# Patient Record
Sex: Female | Born: 1970 | Race: White | Smoking: Never smoker
Health system: Southern US, Community
[De-identification: ages and names within clinical notes are randomized; demographics above are authoritative.]

## PROBLEM LIST (undated history)

## (undated) DIAGNOSIS — I2699 Other pulmonary embolism without acute cor pulmonale: Secondary | ICD-10-CM

## (undated) DIAGNOSIS — C439 Malignant melanoma of skin, unspecified: Secondary | ICD-10-CM

## (undated) DIAGNOSIS — E538 Deficiency of other specified B group vitamins: Secondary | ICD-10-CM

## (undated) DIAGNOSIS — G43909 Migraine, unspecified, not intractable, without status migrainosus: Secondary | ICD-10-CM

## (undated) DIAGNOSIS — G4730 Sleep apnea, unspecified: Secondary | ICD-10-CM

## (undated) DIAGNOSIS — D500 Iron deficiency anemia secondary to blood loss (chronic): Secondary | ICD-10-CM

## (undated) DIAGNOSIS — C4490 Unspecified malignant neoplasm of skin, unspecified: Secondary | ICD-10-CM

## (undated) HISTORY — PX: TUBAL LIGATION: SHX77

## (undated) HISTORY — DX: Sleep apnea, unspecified: G47.30

## (undated) HISTORY — DX: Migraine, unspecified, not intractable, without status migrainosus: G43.909

## (undated) HISTORY — PX: OTHER SURGICAL HISTORY: SHX169

## (undated) HISTORY — DX: Other pulmonary embolism without acute cor pulmonale: I26.99

## (undated) HISTORY — DX: Unspecified malignant neoplasm of skin, unspecified: C44.90

## (undated) HISTORY — DX: Deficiency of other specified B group vitamins: E53.8

## (undated) HISTORY — DX: Iron deficiency anemia secondary to blood loss (chronic): D50.0

## (undated) HISTORY — DX: Malignant melanoma of skin, unspecified: C43.9

## (undated) MED FILL — Iron Sucrose Inj 20 MG/ML (Fe Equiv): INTRAVENOUS | Qty: 15 | Status: AC

---

## 2016-04-09 DIAGNOSIS — C439 Malignant melanoma of skin, unspecified: Secondary | ICD-10-CM | POA: Insufficient documentation

## 2018-01-07 DIAGNOSIS — I2699 Other pulmonary embolism without acute cor pulmonale: Secondary | ICD-10-CM

## 2018-01-07 HISTORY — DX: Other pulmonary embolism without acute cor pulmonale: I26.99

## 2018-01-13 ENCOUNTER — Encounter: Payer: Self-pay | Admitting: Gastroenterology

## 2018-04-28 DIAGNOSIS — N926 Irregular menstruation, unspecified: Secondary | ICD-10-CM | POA: Diagnosis not present

## 2018-04-28 DIAGNOSIS — N920 Excessive and frequent menstruation with regular cycle: Secondary | ICD-10-CM | POA: Diagnosis not present

## 2018-04-28 DIAGNOSIS — D649 Anemia, unspecified: Secondary | ICD-10-CM | POA: Diagnosis not present

## 2018-04-28 DIAGNOSIS — R110 Nausea: Secondary | ICD-10-CM | POA: Diagnosis not present

## 2018-05-14 DIAGNOSIS — N920 Excessive and frequent menstruation with regular cycle: Secondary | ICD-10-CM | POA: Diagnosis not present

## 2018-05-16 DIAGNOSIS — N920 Excessive and frequent menstruation with regular cycle: Secondary | ICD-10-CM | POA: Diagnosis not present

## 2018-05-26 DIAGNOSIS — N840 Polyp of corpus uteri: Secondary | ICD-10-CM | POA: Diagnosis not present

## 2018-05-26 DIAGNOSIS — N920 Excessive and frequent menstruation with regular cycle: Secondary | ICD-10-CM | POA: Diagnosis not present

## 2018-06-09 DIAGNOSIS — L821 Other seborrheic keratosis: Secondary | ICD-10-CM | POA: Diagnosis not present

## 2018-06-09 DIAGNOSIS — Z08 Encounter for follow-up examination after completed treatment for malignant neoplasm: Secondary | ICD-10-CM | POA: Diagnosis not present

## 2018-06-09 DIAGNOSIS — D2271 Melanocytic nevi of right lower limb, including hip: Secondary | ICD-10-CM | POA: Diagnosis not present

## 2018-06-09 DIAGNOSIS — Z85820 Personal history of malignant melanoma of skin: Secondary | ICD-10-CM | POA: Diagnosis not present

## 2018-06-09 DIAGNOSIS — Z1283 Encounter for screening for malignant neoplasm of skin: Secondary | ICD-10-CM | POA: Diagnosis not present

## 2018-06-09 DIAGNOSIS — D485 Neoplasm of uncertain behavior of skin: Secondary | ICD-10-CM | POA: Diagnosis not present

## 2018-06-18 ENCOUNTER — Telehealth: Payer: Self-pay | Admitting: Internal Medicine

## 2018-06-18 NOTE — Telephone Encounter (Signed)
A new hem appt has been scheduled for the pt to see Dr. Walden Field on 3/12 at 950am. Pt agreed to the appt date and time.

## 2018-06-19 ENCOUNTER — Telehealth: Payer: Self-pay | Admitting: Internal Medicine

## 2018-06-19 ENCOUNTER — Inpatient Hospital Stay: Payer: 59

## 2018-06-19 ENCOUNTER — Telehealth: Payer: Self-pay | Admitting: *Deleted

## 2018-06-19 ENCOUNTER — Other Ambulatory Visit: Payer: Self-pay

## 2018-06-19 ENCOUNTER — Inpatient Hospital Stay: Payer: 59 | Attending: Internal Medicine | Admitting: Internal Medicine

## 2018-06-19 ENCOUNTER — Encounter: Payer: Self-pay | Admitting: Internal Medicine

## 2018-06-19 VITALS — BP 142/93 | HR 79 | Temp 98.3°F | Resp 18 | Ht 64.0 in | Wt 188.2 lb

## 2018-06-19 DIAGNOSIS — D508 Other iron deficiency anemias: Secondary | ICD-10-CM | POA: Diagnosis not present

## 2018-06-19 DIAGNOSIS — I2699 Other pulmonary embolism without acute cor pulmonale: Secondary | ICD-10-CM | POA: Insufficient documentation

## 2018-06-19 DIAGNOSIS — Z86718 Personal history of other venous thrombosis and embolism: Secondary | ICD-10-CM

## 2018-06-19 DIAGNOSIS — Z7901 Long term (current) use of anticoagulants: Secondary | ICD-10-CM | POA: Insufficient documentation

## 2018-06-19 DIAGNOSIS — D500 Iron deficiency anemia secondary to blood loss (chronic): Secondary | ICD-10-CM

## 2018-06-19 DIAGNOSIS — Z79899 Other long term (current) drug therapy: Secondary | ICD-10-CM | POA: Diagnosis not present

## 2018-06-19 DIAGNOSIS — N920 Excessive and frequent menstruation with regular cycle: Secondary | ICD-10-CM | POA: Insufficient documentation

## 2018-06-19 DIAGNOSIS — Z85820 Personal history of malignant melanoma of skin: Secondary | ICD-10-CM | POA: Diagnosis not present

## 2018-06-19 LAB — COMPREHENSIVE METABOLIC PANEL
ALK PHOS: 82 U/L (ref 38–126)
ALT: 14 U/L (ref 0–44)
AST: 15 U/L (ref 15–41)
Albumin: 4 g/dL (ref 3.5–5.0)
Anion gap: 10 (ref 5–15)
BUN: 20 mg/dL (ref 6–20)
CALCIUM: 8.8 mg/dL — AB (ref 8.9–10.3)
CO2: 22 mmol/L (ref 22–32)
CREATININE: 0.83 mg/dL (ref 0.44–1.00)
Chloride: 107 mmol/L (ref 98–111)
GFR calc Af Amer: >60 mL/min (ref >60)
GFR calc non Af Amer: >60 mL/min (ref >60)
Glucose, Bld: 89 mg/dL (ref 70–99)
Potassium: 4.2 mmol/L (ref 3.5–5.1)
Sodium: 139 mmol/L (ref 135–145)
Total Bilirubin: 0.5 mg/dL (ref 0.3–1.2)
Total Protein: 7.4 g/dL (ref 6.5–8.1)

## 2018-06-19 LAB — CBC WITH DIFFERENTIAL/PLATELET
Abs Immature Granulocytes: 0.01 10*3/uL (ref 0.00–0.07)
BASOS PCT: 0 %
Basophils Absolute: 0 10*3/uL (ref 0.0–0.1)
Eosinophils Absolute: 0.1 10*3/uL (ref 0.0–0.5)
Eosinophils Relative: 1 %
HCT: 32.3 % — ABNORMAL LOW (ref 36.0–46.0)
Hemoglobin: 10.1 g/dL — ABNORMAL LOW (ref 12.0–15.0)
Immature Granulocytes: 0 %
Lymphocytes Relative: 26 %
Lymphs Abs: 1.5 10*3/uL (ref 0.7–4.0)
MCH: 26.6 pg (ref 26.0–34.0)
MCHC: 31.3 g/dL (ref 30.0–36.0)
MCV: 85 fL (ref 80.0–100.0)
Monocytes Absolute: 0.4 10*3/uL (ref 0.1–1.0)
Monocytes Relative: 7 %
Neutro Abs: 3.6 10*3/uL (ref 1.7–7.7)
Neutrophils Relative %: 66 %
Platelets: 320 10*3/uL (ref 150–400)
RBC: 3.8 MIL/uL — AB (ref 3.87–5.11)
RDW: 17.2 % — ABNORMAL HIGH (ref 11.5–15.5)
WBC: 5.6 10*3/uL (ref 4.0–10.5)
nRBC: 0 % (ref 0.0–0.2)

## 2018-06-19 LAB — FOLATE: Folate: 11.5 ng/mL (ref >5.9)

## 2018-06-19 LAB — LACTATE DEHYDROGENASE: LDH: 146 U/L (ref 98–192)

## 2018-06-19 LAB — VITAMIN B12: Vitamin B-12: 120 pg/mL — ABNORMAL LOW (ref 180–914)

## 2018-06-19 LAB — FERRITIN: Ferritin: <4 ng/mL — ABNORMAL LOW (ref 11–307)

## 2018-06-19 MED ORDER — APIXABAN 5 MG PO TABS: 5.0000 mg | ORAL_TABLET | Freq: Two times a day (BID) | ORAL | 2 refills | Status: DC

## 2018-06-19 NOTE — Progress Notes (Signed)
Referring Physician:  Dr. Dossie Arbour [facility]  Diagnosis Other iron deficiency anemia - Plan: CBC with Differential/Platelet, Comprehensive metabolic panel, Ferritin, Lactate dehydrogenase, Protein electrophoresis, serum, Hemoglobinopathy evaluation, Vitamin B12, Folate, Methylmalonic acid, serum, Haptoglobin, CT ANGIO CHEST PE W OR WO CONTRAST, VAS [place] LOWER EXTREMITY VENOUS (DVT)  Personal history of venous thrombosis and embolism - Plan: CBC with Differential/Platelet, Comprehensive metabolic panel, Ferritin, Lactate dehydrogenase, Protein electrophoresis, serum, Hemoglobinopathy evaluation, Vitamin B12, Folate, Methylmalonic acid, serum, Haptoglobin, CT ANGIO CHEST PE W OR WO CONTRAST, VAS [place] LOWER EXTREMITY VENOUS (DVT)  Staging Cancer Staging No matching staging information was found for the patient.  Assessment and Plan:  1.  Pulmonary emboli.  This was reportedly diagnosed in October 2019.  She has been maintained on Eliquis since that time.  She reports she had an extensive work-up while in [place] that was reportedly negative.  Will obtain records for review.  The patient will be set up for CT Angio of the chest as well as bilateral lower extremity Dopplers for interval evaluation in 07/2018.  I discussed with her usual recommendations are for 6 months of anticoagulation which she will approach in April 2019.  She will follow-up to go over the results.  Rx for Eliquis sent to pharmacy # [id number] with refills.    2.  Iron deficiency anemia.  Labs done 06/19/2018 showed a white count 5.6 hemoglobin 10 platelets 320,000.  She has a normal differential.  Chemistries within normal limits with a potassium of 4.2 creatinine 0.83 normal liver function tests.  Ferritin is decreased at 4 which is consistent with iron deficiency anemia.  Patient has an intolerance for oral iron.  Suspect etiology is due to menorrhagia but she will be referred to GI for evaluation.   Patient is recommended for treatment with Feraheme 510 mg IV day 1 and day 8.  Side effects of IV iron discussed and include an allergic reaction.  The patient will return to clinic in late April for repeat labs after IV iron.  3.  Menorrhagia.  This is likely the etiology of iron deficiency anemia.  She should continue to follow-up with GYN as recommended.  4.  Melanoma.  She reports she had a history of a melanoma on her leg.  She is followed by dermatology.  Will obtain records from [place] for review.  5.  Health maintenance.  Mammogram screenings as recommended.  Will refer to GI due to IDA.    40 minutes spent with more than 50% spent in review of records, counseling and coordination of care.    HPI: 48 year old female referred for consultation due to reported history of PE and iron deficiency.  She reports she was diagnosed with a pulmonary embolus in October 2019 when she lived in [place].  Work-up was reportedly done at that time but no etiology as to the reason for the development of the clot was found.  She has a history of iron deficiency anemia and has been treated with IV iron in November 2019.  She describes her cycles as heavy.  She was on Eliquis 5 mg twice daily.  She has a reported history of melanoma of her leg and is followed by dermatology and had a recent biopsy done.  At the time of the PE she denied any travel.  She denies any family history of thrombosis.  Patient is seen today for consultation due to  pulmonary emboli and iron deficiency anemia.  Problem List Melanoma Pulmonary emboli Iron deficiency anemia  Past Medical History Melanoma Pulmonary emboli Iron deficiency anemia  Past Surgical History Melanoma surgery GYN surgeries  Family History No family history of thrombosis  Social History  Denies smoking, alcohol use  Medications  Current Outpatient Medications:  .  apixaban (ELIQUIS) 5 MG TABS tablet, Take 1 tablet (5 mg total) by mouth 2 (two) times  daily., Disp: 60 tablet, Rfl: 2 .  Ferrous Sulfate (SLOW FE PO), Take by mouth., Disp: , Rfl:  .  pantoprazole (PROTONIX) 20 MG tablet, Take 20 mg by mouth daily., Disp: , Rfl:   Allergies Patient has no allergy information on record.  Review of Systems Review of Systems - Oncology ROS negative  Soreness of leg due to recent dermatology procedure   Physical Exam  Vitals Wt Readings from Last 3 Encounters:  06/19/18 188 lb 3.2 oz (85.4 kg)   Temp Readings from Last 3 Encounters:  06/19/18 98.3 F (36.8 C) (Oral)   BP Readings from Last 3 Encounters:  06/19/18 (!) 142/93   Pulse Readings from Last 3 Encounters:  06/19/18 79   Constitutional: Well-developed, well-nourished, and in no distress.   HENT: Head: Normocephalic and atraumatic.  Mouth/Throat: No oropharyngeal exudate. Mucosa moist. Eyes: Pupils are equal, round, and reactive to light. Conjunctivae are normal. No scleral icterus.  Neck: Normal range of motion. Neck supple. No JVD present.  Cardiovascular: Normal rate, regular rhythm and normal heart sounds.  Exam reveals no gallop and no friction rub.   No murmur heard. Pulmonary/Chest: Effort normal and breath sounds normal. No respiratory distress. No wheezes.No rales.  Abdominal: Soft. Bowel sounds are normal. No distension. There is no tenderness. There is no guarding.  Musculoskeletal: No edema or tenderness.  Lymphadenopathy: No cervical,axillary or supraclavicular adenopathy.  Neurological: Alert and oriented to person, place, and time. No cranial nerve deficit.  Skin: Skin is warm and dry. No rash noted. No erythema. No pallor.  Soreness of leg due to recent dermatology procedure.   Psychiatric: Affect and judgment normal.   Labs Appointment on 06/19/2018  Component Date Value Ref Range Status  . WBC 06/19/2018 5.6  4.0 - 10.5 K/uL Final  . RBC 06/19/2018 3.80* 3.87 - 5.11 MIL/uL Final  . Hemoglobin 06/19/2018 10.1* 12.0 - 15.0 g/dL Final  . HCT  06/19/2018 32.3* 36.0 - 46.0 % Final  . MCV 06/19/2018 85.0  80.0 - 100.0 fL Final  . MCH 06/19/2018 26.6  26.0 - 34.0 pg Final  . MCHC 06/19/2018 31.3  30.0 - 36.0 g/dL Final  . RDW 06/19/2018 17.2* 11.5 - 15.5 % Final  . Platelets 06/19/2018 320  150 - 400 K/uL Final  . nRBC 06/19/2018 0.0  0.0 - 0.2 % Final  . Neutrophils Relative % 06/19/2018 66  % Final  . Neutro Abs 06/19/2018 3.6  1.7 - 7.7 K/uL Final  . Lymphocytes Relative 06/19/2018 26  % Final  . Lymphs Abs 06/19/2018 1.5  0.7 - 4.0 K/uL Final  . Monocytes Relative 06/19/2018 7  % Final  . Monocytes Absolute 06/19/2018 0.4  0.1 - 1.0 K/uL Final  . Eosinophils Relative 06/19/2018 1  % Final  . Eosinophils Absolute 06/19/2018 0.1  0.0 - 0.5 K/uL Final  . Basophils Relative 06/19/2018 0  % Final  . Basophils Absolute 06/19/2018 0.0  0.0 - 0.1 K/uL Final  . Immature Granulocytes 06/19/2018 0  % Final  . Abs Immature Granulocytes  06/19/2018 0.01  0.00 - 0.07 K/uL Final   Performed at [facility], [place] [address] [place], [place] [address]  . Sodium 06/19/2018 139  135 - 145 mmol/L Final  . Potassium 06/19/2018 4.2  3.5 - 5.1 mmol/L Final  . Chloride 06/19/2018 107  98 - 111 mmol/L Final  . CO2 06/19/2018 22  22 - 32 mmol/L Final  . Glucose, Bld 06/19/2018 89  70 - 99 mg/dL Final  . BUN 06/19/2018 20  6 - 20 mg/dL Final  . Creatinine, Ser 06/19/2018 0.83  0.44 - 1.00 mg/dL Final  . Calcium 06/19/2018 8.8* 8.9 - 10.3 mg/dL Final  . Total Protein 06/19/2018 7.4  6.5 - 8.1 g/dL Final  . Albumin 06/19/2018 4.0  3.5 - 5.0 g/dL Final  . AST 06/19/2018 15  15 - 41 U/L Final  . ALT 06/19/2018 14  0 - 44 U/L Final  . Alkaline Phosphatase 06/19/2018 82  38 - 126 U/L Final  . Total Bilirubin 06/19/2018 0.5  0.3 - 1.2 mg/dL Final  . GFR calc non Af Amer 06/19/2018 >60  >60 mL/min Final  . GFR calc Af Amer 06/19/2018 >60  >60 mL/min Final  . Anion gap 06/19/2018 10  5 - 15 Final   Performed at [facility], [place] [address] [place], [place] [address]  . Ferritin 06/19/2018 <4* 11 - 307 ng/mL Final   Performed at [facility], [place] [address] [place], [place] [address]  . LDH 06/19/2018 146  98 - 192 U/L Final   Performed at [facility], [place] [address] [place], [place] [address]     Pathology Orders Placed This Encounter  Procedures  . CT ANGIO CHEST PE W OR WO CONTRAST    Standing Status:   Future    Standing Expiration Date:   09/19/2019    Order Specific Question:   If indicated for the ordered procedure, I authorize the administration of contrast media per Radiology protocol    Answer:   Yes    Order Specific Question:   Is patient pregnant?    Answer:   No    Order Specific Question:   Preferred imaging location?    Answer:   [facility]    Order Specific Question:   Radiology Contrast Protocol - do NOT remove file path    Answer:   [email or web address]  . CBC with Differential/Platelet    Standing Status:   Future    Number of Occurrences:   1    Standing Expiration Date:   06/19/2019  . Comprehensive metabolic panel    Standing Status:   Future    Number of Occurrences:   1    Standing Expiration Date:   06/19/2019  . Ferritin    Standing Status:   Future    Number of Occurrences:   1    Standing Expiration Date:   06/19/2019  . Lactate dehydrogenase    Standing Status:   Future    Number of Occurrences:   1    Standing Expiration Date:   06/19/2019  . Protein electrophoresis, serum    Standing Status:   Future    Number of Occurrences:   1    Standing Expiration Date:   06/19/2019  . Hemoglobinopathy evaluation    Standing Status:   Future    Number of Occurrences:   1    Standing Expiration Date:  06/19/2019  . Vitamin B12    Standing Status:   Future    Number of Occurrences:   1    Standing Expiration Date:   06/19/2019  . Folate    Standing Status:   Future     Number of Occurrences:   1    Standing Expiration Date:   06/19/2019  . Methylmalonic acid, serum    Standing Status:   Future    Number of Occurrences:   1    Standing Expiration Date:   06/19/2019  . Haptoglobin    Standing Status:   Future    Number of Occurrences:   1    Standing Expiration Date:   06/19/2019       Zoila Shutter MD

## 2018-06-19 NOTE — Telephone Encounter (Signed)
TCT patient regarding lab results from today's visit. Pt's iron level is low and will need IV iron x 2. No answer on pt's phone but was able to leave vm message regarding the above.  Advised pt to call back with any questions or concerns.  Scheduling message sent for IV fereheme appts.

## 2018-06-19 NOTE — Telephone Encounter (Signed)
-----   Message from Zoila Shutter, MD sent at 06/19/2018  1:42 PM EDT ----- Notify pt iron levels are low.  Set up for IV iron.  Orders entered.

## 2018-06-19 NOTE — Telephone Encounter (Signed)
Gave avs and calendar, called and scheduled ct and doppler

## 2018-06-20 ENCOUNTER — Other Ambulatory Visit: Payer: Self-pay | Admitting: Internal Medicine

## 2018-06-20 ENCOUNTER — Telehealth: Payer: Self-pay | Admitting: Internal Medicine

## 2018-06-20 ENCOUNTER — Telehealth: Payer: Self-pay | Admitting: *Deleted

## 2018-06-20 LAB — PROTEIN ELECTROPHORESIS, SERUM
A/G Ratio: 1.2 (ref 0.7–1.7)
Albumin ELP: 3.7 g/dL (ref 2.9–4.4)
Alpha-1-Globulin: 0.2 g/dL (ref 0.0–0.4)
Alpha-2-Globulin: 0.8 g/dL (ref 0.4–1.0)
BETA GLOBULIN: 1.2 g/dL (ref 0.7–1.3)
GAMMA GLOBULIN: 0.9 g/dL (ref 0.4–1.8)
Globulin, Total: 3.2 g/dL (ref 2.2–3.9)
Total Protein ELP: 6.9 g/dL (ref 6.0–8.5)

## 2018-06-20 LAB — HAPTOGLOBIN: Haptoglobin: 174 mg/dL (ref 42–296)

## 2018-06-20 MED FILL — PANTOPRAZOLE SOD DR 40 MG T: 40 | 30 days supply | Qty: 30 | Fill #0 | Status: TO

## 2018-06-20 MED FILL — ELIQUIS 5 MG TABLET: 5 | 30 days supply | Qty: 60 | Fill #0 | Status: TO

## 2018-06-20 MED FILL — LEVOTHYROXINE 25 MCG TABLET: 25 | 30 days supply | Qty: 30 | Fill #0 | Status: TO

## 2018-06-20 NOTE — Telephone Encounter (Signed)
-----   Message from Zoila Shutter, MD sent at 06/20/2018 10:07 AM EDT ----- Notify pt her B12 levels are also low and she will get B12 injection the day of iron treatment and continue monthly

## 2018-06-20 NOTE — Telephone Encounter (Signed)
Pt vmail full - unable to leave message - sent reminder letter in the mail with appt date and time per 3/12 sch message.

## 2018-06-20 NOTE — Telephone Encounter (Signed)
TCT to patient regarding lab rsults from this week. No answer but but able to leave message on identified phone #. Advised pt that her B12 levels were also low and that she would be getting a B12 injection the same day as her 1st iron infusion.  Advised that her iron infusions have not been scheduled yet but that I expect them to be in a few days. Advised pt to call back @ [phone] with any questions or concerns.

## 2018-06-21 LAB — METHYLMALONIC ACID, SERUM: Methylmalonic Acid, Quantitative: 195 nmol/L (ref 0–378)

## 2018-06-23 LAB — HEMOGLOBINOPATHY EVALUATION
Hgb A2 Quant: 2 % (ref 1.8–3.2)
Hgb A: 98 % (ref 96.4–98.8)
Hgb C: 0 %
Hgb F Quant: 0 % (ref 0.0–2.0)
Hgb S Quant: 0 %
Hgb Variant: 0 %

## 2018-06-23 MED FILL — TRANEXAMIC ACID 650 MG TAB: 650 | 5 days supply | Qty: 30 | Fill #0

## 2018-06-24 DIAGNOSIS — N920 Excessive and frequent menstruation with regular cycle: Secondary | ICD-10-CM | POA: Diagnosis not present

## 2018-06-25 ENCOUNTER — Inpatient Hospital Stay: Payer: 59

## 2018-06-25 ENCOUNTER — Other Ambulatory Visit: Payer: Self-pay | Admitting: Emergency Medicine

## 2018-06-25 ENCOUNTER — Other Ambulatory Visit: Payer: Self-pay

## 2018-06-25 ENCOUNTER — Encounter: Payer: Self-pay | Admitting: *Deleted

## 2018-06-25 VITALS — BP 130/77 | HR 74 | Temp 98.5°F | Resp 16

## 2018-06-25 DIAGNOSIS — D508 Other iron deficiency anemias: Secondary | ICD-10-CM | POA: Diagnosis not present

## 2018-06-25 DIAGNOSIS — D500 Iron deficiency anemia secondary to blood loss (chronic): Secondary | ICD-10-CM

## 2018-06-25 DIAGNOSIS — Z79899 Other long term (current) drug therapy: Secondary | ICD-10-CM | POA: Diagnosis not present

## 2018-06-25 DIAGNOSIS — Z7901 Long term (current) use of anticoagulants: Secondary | ICD-10-CM | POA: Diagnosis not present

## 2018-06-25 DIAGNOSIS — I2699 Other pulmonary embolism without acute cor pulmonale: Secondary | ICD-10-CM | POA: Diagnosis not present

## 2018-06-25 DIAGNOSIS — Z85820 Personal history of malignant melanoma of skin: Secondary | ICD-10-CM | POA: Diagnosis not present

## 2018-06-25 DIAGNOSIS — N920 Excessive and frequent menstruation with regular cycle: Secondary | ICD-10-CM | POA: Diagnosis not present

## 2018-06-25 LAB — CBC WITH DIFFERENTIAL (CANCER CENTER ONLY)
Abs Immature Granulocytes: 0.01 10*3/uL (ref 0.00–0.07)
Basophils Absolute: 0 10*3/uL (ref 0.0–0.1)
Basophils Relative: 1 %
Eosinophils Absolute: 0 10*3/uL (ref 0.0–0.5)
Eosinophils Relative: 1 %
HCT: 22.1 % — ABNORMAL LOW (ref 36.0–46.0)
Hemoglobin: 6.9 g/dL — CL (ref 12.0–15.0)
Immature Granulocytes: 0 %
Lymphocytes Relative: 25 %
Lymphs Abs: 1.2 10*3/uL (ref 0.7–4.0)
MCH: 26.5 pg (ref 26.0–34.0)
MCHC: 31.2 g/dL (ref 30.0–36.0)
MCV: 85 fL (ref 80.0–100.0)
MONO ABS: 0.4 10*3/uL (ref 0.1–1.0)
Monocytes Relative: 8 %
NEUTROS ABS: 3.1 10*3/uL (ref 1.7–7.7)
Neutrophils Relative %: 65 %
Platelet Count: 263 10*3/uL (ref 150–400)
RBC: 2.6 MIL/uL — ABNORMAL LOW (ref 3.87–5.11)
RDW: 16.8 % — ABNORMAL HIGH (ref 11.5–15.5)
WBC Count: 4.8 10*3/uL (ref 4.0–10.5)
nRBC: 0 % (ref 0.0–0.2)

## 2018-06-25 LAB — SAMPLE TO BLOOD BANK

## 2018-06-25 LAB — ABO/RH: ABO/RH(D): O POS

## 2018-06-25 LAB — PREPARE RBC (CROSSMATCH)

## 2018-06-25 MED ORDER — SODIUM CHLORIDE 0.9 % IV SOLN
510.0000 mg | Freq: Once | INTRAVENOUS | Status: AC
  Administered 2018-06-25: 510 mg via INTRAVENOUS
  Filled 2018-06-25: qty 17

## 2018-06-25 MED ORDER — DIPHENHYDRAMINE HCL 25 MG PO CAPS
ORAL_CAPSULE | ORAL | Status: AC
  Filled 2018-06-25: qty 1

## 2018-06-25 MED ORDER — ACETAMINOPHEN 325 MG PO TABS
ORAL_TABLET | ORAL | Status: AC
  Filled 2018-06-25: qty 2

## 2018-06-25 MED ORDER — CYANOCOBALAMIN 1000 MCG/ML IJ SOLN
INTRAMUSCULAR | Status: AC
  Filled 2018-06-25: qty 1

## 2018-06-25 MED ORDER — SODIUM CHLORIDE 0.9 % IV SOLN
Freq: Once | INTRAVENOUS | Status: AC
  Administered 2018-06-25: 11:00:00 via INTRAVENOUS
  Filled 2018-06-25: qty 250

## 2018-06-25 MED ORDER — CYANOCOBALAMIN 1000 MCG/ML IJ SOLN
1000.0000 ug | Freq: Once | INTRAMUSCULAR | Status: AC
  Administered 2018-06-25: 1000 ug via INTRAMUSCULAR

## 2018-06-25 MED ORDER — SODIUM CHLORIDE 0.9% IV SOLUTION
250.0000 mL | Freq: Once | INTRAVENOUS | Status: AC
  Administered 2018-06-25: 250 mL via INTRAVENOUS
  Filled 2018-06-25: qty 250

## 2018-06-25 NOTE — Progress Notes (Signed)
Pt given signed note from MD [name] for work/school attendance.  VO from MD [name] to have f/u lab appt on 3/27 before pt's next iron infusion to recheck Hgb in case she needs more blood.  Order place and scheduling message sent.

## 2018-06-25 NOTE — Patient Instructions (Addendum)
Blood Transfusion, Adult, Care After This sheet gives you information about how to care for yourself after your procedure. Your doctor may also give you more specific instructions. If you have problems or questions, contact your doctor. Follow these instructions at home:   Take over-the-counter and prescription medicines only as told by your doctor.  Go back to your normal activities as told by your doctor.  Follow instructions from your doctor about how to take care of the area where an IV tube was put into your vein (insertion site). Make sure you: ? Wash your hands with soap and water before you change your bandage (dressing). If there is no soap and water, use hand sanitizer. ? Change your bandage as told by your doctor.  Check your IV insertion site every day for signs of infection. Check for: ? More redness, swelling, or pain. ? More fluid or blood. ? Warmth. ? Pus or a bad smell. Contact a doctor if:  You have more redness, swelling, or pain around the IV insertion site.  You have more fluid or blood coming from the IV insertion site.  Your IV insertion site feels warm to the touch.  You have pus or a bad smell coming from the IV insertion site.  Your pee (urine) turns pink, red, or brown.  You feel weak after doing your normal activities. Get help right away if:  You have signs of a serious allergic or body defense (immune) system reaction, including: ? Itchiness. ? Hives. ? Trouble breathing. ? Anxiety. ? Pain in your chest or lower back. ? Fever, flushing, and chills. ? Fast pulse. ? Rash. ? Watery poop (diarrhea). ? Throwing up (vomiting). ? Dark pee. ? Serious headache. ? Dizziness. ? Stiff neck. ? Yellow color in your face or the white parts of your eyes (jaundice). Summary  After a blood transfusion, return to your normal activities as told by your doctor.  Every day, check for signs of infection where the IV tube was put into your vein.  Some  signs of infection are warm skin, more redness and pain, more fluid or blood, and pus or a bad smell where the needle went in.  Contact your doctor if you feel weak or have any unusual symptoms. This information is not intended to replace advice given to you by your health care provider. Make sure you discuss any questions you have with your health care provider. Document Released: 04/16/2014 Document Revised: 11/18/2015 Document Reviewed: 11/18/2015 Elsevier Interactive Patient Education  2019 Elsevier Inc.  

## 2018-06-26 ENCOUNTER — Telehealth: Payer: Self-pay | Admitting: *Deleted

## 2018-06-26 LAB — TYPE AND SCREEN
ABO/RH(D): O POS
Antibody Screen: NEGATIVE
Unit division: 0

## 2018-06-26 LAB — BPAM RBC
Blood Product Expiration Date: 202004132359
ISSUE DATE / TIME: 202003181412
Unit Type and Rh: 5100

## 2018-06-26 NOTE — Telephone Encounter (Signed)
TCT patient with re-scheduled dates for CT Angio and LE doppler studies. Reviewed dates and times with patient. She voiced understanding and will be able to make those appointments.

## 2018-06-27 ENCOUNTER — Other Ambulatory Visit: Payer: Self-pay

## 2018-06-27 ENCOUNTER — Telehealth: Payer: Self-pay | Admitting: *Deleted

## 2018-06-27 ENCOUNTER — Ambulatory Visit: Payer: 59

## 2018-06-27 ENCOUNTER — Ambulatory Visit (HOSPITAL_COMMUNITY)
Admission: RE | Admit: 2018-06-27 | Discharge: 2018-06-27 | Disposition: A | Discharge status: Home / Self Care | Payer: 59 | Source: Ambulatory Visit | Attending: Internal Medicine | Admitting: Internal Medicine

## 2018-06-27 DIAGNOSIS — Z86718 Personal history of other venous thrombosis and embolism: Secondary | ICD-10-CM

## 2018-06-27 DIAGNOSIS — D508 Other iron deficiency anemias: Secondary | ICD-10-CM | POA: Diagnosis not present

## 2018-06-27 NOTE — Telephone Encounter (Signed)
"  [place] call report.  Caroll Rancher s/p bilateral lower extremity venous study.  Study is completely negative.  She is leaving here to go to work.  Asked for office to contact her if any instructions."

## 2018-06-27 NOTE — Progress Notes (Signed)
Bilateral lower extremity venous duplex completed. Preliminary results in Chart review CV proc. [place] [name],RVS 06/27/2018 10:27 AM

## 2018-06-27 NOTE — Telephone Encounter (Signed)
Conveyed Provider instructions.

## 2018-07-03 ENCOUNTER — Telehealth: Payer: Self-pay | Admitting: *Deleted

## 2018-07-03 ENCOUNTER — Other Ambulatory Visit: Payer: Self-pay

## 2018-07-03 ENCOUNTER — Ambulatory Visit (HOSPITAL_COMMUNITY)
Admission: RE | Admit: 2018-07-03 | Discharge: 2018-07-03 | Disposition: A | Discharge status: Home / Self Care | Payer: 59 | Source: Ambulatory Visit | Attending: Internal Medicine | Admitting: Internal Medicine

## 2018-07-03 DIAGNOSIS — R0602 Shortness of breath: Secondary | ICD-10-CM | POA: Diagnosis not present

## 2018-07-03 DIAGNOSIS — Z86718 Personal history of other venous thrombosis and embolism: Secondary | ICD-10-CM | POA: Insufficient documentation

## 2018-07-03 DIAGNOSIS — D508 Other iron deficiency anemias: Secondary | ICD-10-CM | POA: Insufficient documentation

## 2018-07-03 MED ORDER — IOHEXOL 350 MG/ML SOLN
100.0000 mL | Freq: Once | INTRAVENOUS | Status: AC | PRN
  Administered 2018-07-03: 58 mL via INTRAVENOUS

## 2018-07-03 MED ORDER — SODIUM CHLORIDE (PF) 0.9 % IJ SOLN
INTRAMUSCULAR | Status: AC
  Filled 2018-07-03: qty 50

## 2018-07-03 NOTE — Telephone Encounter (Signed)
Attempted to call pt to inform her of lab appt prior to iron infusion on 07/04/18.  No answers.  Unable to leave message due to voice mail box is full.

## 2018-07-04 ENCOUNTER — Other Ambulatory Visit: Payer: Self-pay

## 2018-07-04 ENCOUNTER — Other Ambulatory Visit: Payer: 59

## 2018-07-04 ENCOUNTER — Inpatient Hospital Stay: Payer: 59

## 2018-07-04 VITALS — BP 131/83 | HR 69 | Temp 98.5°F | Resp 18

## 2018-07-04 DIAGNOSIS — I2699 Other pulmonary embolism without acute cor pulmonale: Secondary | ICD-10-CM | POA: Diagnosis not present

## 2018-07-04 DIAGNOSIS — Z79899 Other long term (current) drug therapy: Secondary | ICD-10-CM | POA: Diagnosis not present

## 2018-07-04 DIAGNOSIS — Z85820 Personal history of malignant melanoma of skin: Secondary | ICD-10-CM | POA: Diagnosis not present

## 2018-07-04 DIAGNOSIS — Z7901 Long term (current) use of anticoagulants: Secondary | ICD-10-CM | POA: Diagnosis not present

## 2018-07-04 DIAGNOSIS — D508 Other iron deficiency anemias: Secondary | ICD-10-CM | POA: Diagnosis not present

## 2018-07-04 DIAGNOSIS — N920 Excessive and frequent menstruation with regular cycle: Secondary | ICD-10-CM | POA: Diagnosis not present

## 2018-07-04 DIAGNOSIS — D500 Iron deficiency anemia secondary to blood loss (chronic): Secondary | ICD-10-CM

## 2018-07-04 MED ORDER — SODIUM CHLORIDE 0.9 % IV SOLN
Freq: Once | INTRAVENOUS | Status: AC
  Administered 2018-07-04: 09:00:00 via INTRAVENOUS
  Filled 2018-07-04: qty 250

## 2018-07-04 MED ORDER — SODIUM CHLORIDE 0.9 % IV SOLN
510.0000 mg | Freq: Once | INTRAVENOUS | Status: AC
  Administered 2018-07-04: 510 mg via INTRAVENOUS
  Filled 2018-07-04: qty 17

## 2018-07-04 NOTE — Telephone Encounter (Signed)
Opened in error

## 2018-07-04 NOTE — Patient Instructions (Addendum)
Coronavirus (COVID-19) Are you at risk?  Are you at risk for the Coronavirus (COVID-19)?  To be considered HIGH RISK for Coronavirus (COVID-19), you have to meet the following criteria:  . Traveled to [place], [place], [place], [place] or [place]; or in the [place] to [place], [place], [place], or [place]; and have fever, cough, and shortness of breath within the last 2 weeks of travel OR . Been in close contact with a person diagnosed with COVID-19 within the last 2 weeks and have fever, cough, and shortness of breath . IF YOU DO NOT MEET THESE CRITERIA, YOU ARE CONSIDERED LOW RISK FOR COVID-19.  What to do if you are HIGH RISK for COVID-19?  . If you are having a medical emergency, call 911. . Seek medical care right away. Before you go to a doctor's office, urgent care or emergency department, call ahead and tell them about your recent travel, contact with someone diagnosed with COVID-19, and your symptoms. You should receive instructions from your physician's office regarding next steps of care.  . When you arrive at healthcare provider, tell the healthcare staff immediately you have returned from visiting [place], [place], [place], [place] or [place]; or traveled in the [place] to [place], [place], [place], or [place]; in the last two weeks or you have been in close contact with a person diagnosed with COVID-19 in the last 2 weeks.   . Tell the health care staff about your symptoms: fever, cough and shortness of breath. . After you have been seen by a medical provider, you will be either: o Tested for (COVID-19) and discharged home on quarantine except to seek medical care if symptoms worsen, and asked to  - Stay home and avoid contact with others until you get your results (4-5 days)  - Avoid travel on public transportation if possible (such as bus, train, or airplane) or o Sent to the Emergency Department by EMS for evaluation, COVID-19 testing, and possible  admission depending on your condition and test results.  What to do if you are LOW RISK for COVID-19?  Reduce your risk of any infection by using the same precautions used for avoiding the common cold or flu:  . Wash your hands often with soap and warm water for at least 20 seconds.  If soap and water are not readily available, use an alcohol-based hand sanitizer with at least 60% alcohol.  . If coughing or sneezing, cover your mouth and nose by coughing or sneezing into the elbow areas of your shirt or coat, into a tissue or into your sleeve (not your hands). . Avoid shaking hands with others and consider head nods or verbal greetings only. . Avoid touching your eyes, nose, or mouth with unwashed hands.  . Avoid close contact with people who are sick. . Avoid places or events with large numbers of people in one location, like concerts or sporting events. . Carefully consider travel plans you have or are making. . If you are planning any travel outside or inside the [place], visit the CDC's Travelers' Health webpage for the latest health notices. . If you have some symptoms but not all symptoms, continue to monitor at home and seek medical attention if your symptoms worsen. . If you are having a medical emergency, call 911.  ADDITIONAL HEALTHCARE OPTIONS FOR PATIENTS  [facility] / e-Visit: [email or web address]         [facility]: [phone]  [facility]: [phone]                   [facility]: [phone]   Ferumoxytol injection What is this medicine? FERUMOXYTOL is an iron complex. Iron is used to make healthy red blood cells, which carry oxygen and nutrients throughout the body. This medicine is used to treat iron deficiency anemia. This medicine may be used for other purposes; ask your health care provider or pharmacist if you have questions. COMMON BRAND NAME(S): Feraheme What should I  tell my health care provider before I take this medicine? They need to know if you have any of these conditions: -anemia not caused by low iron levels -high levels of iron in the blood -magnetic resonance imaging (MRI) test scheduled -an unusual or allergic reaction to iron, other medicines, foods, dyes, or preservatives -pregnant or trying to get pregnant -breast-feeding How should I use this medicine? This medicine is for injection into a vein. It is given by a health care professional in a hospital or clinic setting. Talk to your pediatrician regarding the use of this medicine in children. Special care may be needed. Overdosage: If you think you have taken too much of this medicine contact a poison control center or emergency room at once. NOTE: This medicine is only for you. Do not share this medicine with others. What if I miss a dose? It is important not to miss your dose. Call your doctor or health care professional if you are unable to keep an appointment. What may interact with this medicine? This medicine may interact with the following medications: -other iron products This list may not describe all possible interactions. Give your health care provider a list of all the medicines, herbs, non-prescription drugs, or dietary supplements you use. Also tell them if you smoke, drink alcohol, or use illegal drugs. Some items may interact with your medicine. What should I watch for while using this medicine? Visit your doctor or healthcare professional regularly. Tell your doctor or healthcare professional if your symptoms do not start to get better or if they get worse. You may need blood work done while you are taking this medicine. You may need to follow a special diet. Talk to your doctor. Foods that contain iron include: whole grains/cereals, dried fruits, beans, or peas, leafy green vegetables, and organ meats (liver, kidney). What side effects may I notice from receiving this  medicine? Side effects that you should report to your doctor or health care professional as soon as possible: -allergic reactions like skin rash, itching or hives, swelling of the face, lips, or tongue -breathing problems -changes in blood pressure -feeling faint or lightheaded, falls -fever or chills -flushing, sweating, or hot feelings -swelling of the ankles or feet Side effects that usually do not require medical attention (report to your doctor or health care professional if they continue or are bothersome): -diarrhea -headache -nausea, vomiting -stomach pain This list may not describe all possible side effects. Call your doctor for medical advice about side effects. You may report side effects to FDA at 1-800-FDA-1088. Where should I keep my medicine? This drug is given in a hospital or clinic and will not be stored at home. NOTE: This sheet is a summary. It may not cover all possible information. If you have questions about this medicine, talk to your doctor, pharmacist, or health care provider.  2019 Elsevier/Gold Standard (2016-05-14 20:21:10)  

## 2018-07-28 ENCOUNTER — Encounter (HOSPITAL_COMMUNITY): Payer: 59

## 2018-07-28 ENCOUNTER — Ambulatory Visit (HOSPITAL_COMMUNITY): Payer: 59

## 2018-08-04 ENCOUNTER — Telehealth: Payer: Self-pay | Admitting: Internal Medicine

## 2018-08-04 NOTE — Telephone Encounter (Signed)
Called patient per 4/24 sch message - unable to reach patient . Left message for patient that appt was changed to a phone call.

## 2018-08-07 ENCOUNTER — Inpatient Hospital Stay: Payer: 59 | Attending: Internal Medicine | Admitting: Internal Medicine

## 2018-08-07 ENCOUNTER — Telehealth: Payer: Self-pay | Admitting: *Deleted

## 2018-08-07 ENCOUNTER — Other Ambulatory Visit: Payer: Self-pay | Admitting: Internal Medicine

## 2018-08-07 DIAGNOSIS — Z85820 Personal history of malignant melanoma of skin: Secondary | ICD-10-CM

## 2018-08-07 DIAGNOSIS — D509 Iron deficiency anemia, unspecified: Secondary | ICD-10-CM | POA: Diagnosis not present

## 2018-08-07 DIAGNOSIS — N920 Excessive and frequent menstruation with regular cycle: Secondary | ICD-10-CM | POA: Diagnosis not present

## 2018-08-07 DIAGNOSIS — D500 Iron deficiency anemia secondary to blood loss (chronic): Secondary | ICD-10-CM

## 2018-08-07 MED FILL — PANTOPRAZOLE SOD DR 40 MG T: 40 | 30 days supply | Qty: 30 | Fill #0

## 2018-08-07 MED FILL — LEVOTHYROXINE 25 MCG TABLET: 25 | 30 days supply | Qty: 30 | Fill #0

## 2018-08-07 MED FILL — ELIQUIS 5 MG TABLET: 5 | 30 days supply | Qty: 60 | Fill #0

## 2018-08-07 NOTE — Progress Notes (Signed)
Virtual Visit via Telephone Note  I connected with Rhonda Ford on 08/07/18 at  9:30 AM EDT by telephone and verified that I am speaking with the correct person using two identifiers.   I discussed the limitations, risks, security and privacy concerns of performing an evaluation and management service by telephone and the availability of in person appointments. I also discussed with the patient that there may be a patient responsible charge related to this service. The patient expressed understanding and agreed to proceed.  Interval History:  Historical data obtained from note dated 06/19/2018.  48 year old female referred for consultation due to reported history of PE and iron deficiency.  She reports she was diagnosed with a pulmonary embolus in October 2019 when she lived in [place].  Work-up was reportedly done at that time but no etiology as to the reason for the development of the clot was found.  She has a history of iron deficiency anemia and has been treated with IV iron in November 2019.  She describes her cycles as heavy.  She was on Eliquis 5 mg twice daily.  She has a reported history of melanoma of her leg and is followed by dermatology and had a recent biopsy done.  At the time of the PE she denied any travel.  She denies any family history of thrombosis.   Observations/Objective: Review of CT and doppler studies.     Assessment and Plan:1.  Pulmonary emboli.  This was reportedly diagnosed in October 2019.  She has been maintained on Eliquis since that time.  She reports she had an extensive work-up while in [place] that was reportedly negative.  We have still not received records from [place].  CT Angio of the chest done 06/27/2018 reviewed and showed   IMPRESSION: 1. No demonstrable pulmonary embolus. No thoracic aortic aneurysm or dissection.  2.  Lungs clear.  3. No demonstrable thoracic adenopathy. No evident neoplastic focus in the chest.  Bilateral lower extremity  Dopplers done 06/27/2018 reviewed and showed Summary: Right: There is no evidence of deep vein thrombosis in the lower extremity. No cystic structure found in the popliteal fossa. Left: There is no evidence of deep vein thrombosis in the lower extremity. No cystic structure found in the popliteal fossa.  Pt was informed in 06/2018 we see no evidence of thrombosis.  She was informed to discontinue Eliquis.  Pt reports today she was slowly lowering dose of Eliquis and did not stop the medication as previously directed.  I discussed with her today that severe anemia with bleeding is also the reason to discontinue Eliquis as recent imaging has shown no evidence of thrombosis.  Pt is advised to forward records once available for review. Pt will be seen for follow-up in 10/2018 with labs.     2.  Iron deficiency anemia.  Labs done 06/19/2018 showed a white count 5.6 hemoglobin 10 platelets 320,000.  She has a normal differential.  Chemistries within normal limits with a potassium of 4.2 creatinine 0.83 normal liver function tests.  Ferritin is decreased at 4 which is consistent with iron deficiency anemia.    Pt had labs done 06/25/2018 that showed WBC 4.8 HB 6.9 plts 263,000.  She was treated with Feraheme on 06/25/2018 and 07/04/2018.  She has not had repeat labs as previously recommended.  Pt will come in on 08/08/2018 for labs and will be notified of results.  She should follow-up with GYN and GI as previously recommended.  Suspect etiology is due to  menorrhagia.    3.  Menorrhagia.  This is likely the etiology of iron deficiency anemia.  She should continue to follow-up with GYN as recommended.  4.  Melanoma.  She reports she had a history of a melanoma on her leg.  She is followed by dermatology.  Have not received [place] records. Will ask for staff to contact facility for records.    5.  Health maintenance.  Mammogram screenings as recommended.  Will refer to GI due to IDA.    Follow Up Instructions: Pt  will have labs on 08/08/2018.  She will RTC for follow-up in 10/2018 with labs at that time.      I discussed the assessment and treatment plan with the patient. The patient was provided an opportunity to ask questions and all were answered. The patient agreed with the plan and demonstrated an understanding of the instructions.   The patient was advised to call back or seek an in-person evaluation if the symptoms worsen or if the condition fails to improve as anticipated.  I provided 15 minutes of non-face-to-face time during this encounter.   Zoila Shutter, MD

## 2018-08-07 NOTE — Telephone Encounter (Signed)
Spoke with Asim, pharmacy tech @ [facility], and instructed him  NOT to refill Eliquis  As per Dr. Walden Field instructions.  Asim voiced understanding and stated he would close out this prescription.

## 2018-08-08 ENCOUNTER — Other Ambulatory Visit: Payer: Self-pay

## 2018-08-08 ENCOUNTER — Telehealth: Payer: Self-pay | Admitting: *Deleted

## 2018-08-08 ENCOUNTER — Inpatient Hospital Stay: Payer: 59 | Attending: Internal Medicine

## 2018-08-08 DIAGNOSIS — D500 Iron deficiency anemia secondary to blood loss (chronic): Secondary | ICD-10-CM | POA: Insufficient documentation

## 2018-08-08 DIAGNOSIS — N920 Excessive and frequent menstruation with regular cycle: Secondary | ICD-10-CM | POA: Insufficient documentation

## 2018-08-08 LAB — CBC WITH DIFFERENTIAL (CANCER CENTER ONLY)
Abs Immature Granulocytes: 0.02 10*3/uL (ref 0.00–0.07)
Basophils Absolute: 0 10*3/uL (ref 0.0–0.1)
Basophils Relative: 0 %
Eosinophils Absolute: 0.1 10*3/uL (ref 0.0–0.5)
Eosinophils Relative: 2 %
HCT: 35.4 % — ABNORMAL LOW (ref 36.0–46.0)
Hemoglobin: 11.3 g/dL — ABNORMAL LOW (ref 12.0–15.0)
Immature Granulocytes: 0 %
Lymphocytes Relative: 23 %
Lymphs Abs: 1.3 10*3/uL (ref 0.7–4.0)
MCH: 29.4 pg (ref 26.0–34.0)
MCHC: 31.9 g/dL (ref 30.0–36.0)
MCV: 92.2 fL (ref 80.0–100.0)
Monocytes Absolute: 0.4 10*3/uL (ref 0.1–1.0)
Monocytes Relative: 8 %
Neutro Abs: 3.6 10*3/uL (ref 1.7–7.7)
Neutrophils Relative %: 67 %
Platelet Count: 295 10*3/uL (ref 150–400)
RBC: 3.84 MIL/uL — ABNORMAL LOW (ref 3.87–5.11)
RDW: 16.1 % — ABNORMAL HIGH (ref 11.5–15.5)
WBC Count: 5.4 10*3/uL (ref 4.0–10.5)
nRBC: 0 % (ref 0.0–0.2)

## 2018-08-08 LAB — CMP (CANCER CENTER ONLY)
ALT: 15 U/L (ref 0–44)
AST: 11 U/L — ABNORMAL LOW (ref 15–41)
Albumin: 3.6 g/dL (ref 3.5–5.0)
Alkaline Phosphatase: 75 U/L (ref 38–126)
Anion gap: 8 (ref 5–15)
BUN: 18 mg/dL (ref 6–20)
CO2: 24 mmol/L (ref 22–32)
Calcium: 8.5 mg/dL — ABNORMAL LOW (ref 8.9–10.3)
Chloride: 107 mmol/L (ref 98–111)
Creatinine: 0.72 mg/dL (ref 0.44–1.00)
GFR, Est AFR Am: >60 mL/min (ref >60)
GFR, Estimated: >60 mL/min (ref >60)
Glucose, Bld: 99 mg/dL (ref 70–99)
Potassium: 3.8 mmol/L (ref 3.5–5.1)
Sodium: 139 mmol/L (ref 135–145)
Total Bilirubin: 0.3 mg/dL (ref 0.3–1.2)
Total Protein: 6.8 g/dL (ref 6.5–8.1)

## 2018-08-08 LAB — LACTATE DEHYDROGENASE: LDH: 125 U/L (ref 98–192)

## 2018-08-08 LAB — FERRITIN: Ferritin: 11 ng/mL (ref 11–307)

## 2018-08-08 NOTE — Telephone Encounter (Signed)
-----   Message from Zoila Shutter, MD sent at 08/08/2018 12:27 PM EDT ----- Notity pt HB improved at 11 and iron levels improving.  Will repeat labs in 10/2018

## 2018-08-08 NOTE — Telephone Encounter (Signed)
Attempted to call pt for lab results as per Dr. Walden Field.  Unable to leave message due to voice mail is full.

## 2018-08-11 ENCOUNTER — Telehealth: Payer: Self-pay

## 2018-08-11 NOTE — Telephone Encounter (Signed)
Rec'd from [facility] forwarded 7 pages to [place] Provider

## 2018-08-11 NOTE — Telephone Encounter (Signed)
ROI fax to [facility] for records.

## 2018-08-27 ENCOUNTER — Telehealth: Payer: Self-pay | Admitting: Gastroenterology

## 2018-08-27 NOTE — Telephone Encounter (Signed)
OK to schedule with me. Will need colon, EGD and any other GI records.

## 2018-08-27 NOTE — Telephone Encounter (Signed)
Dr. Fuller Plan, pt's previous colon and EGD report from 2019 from [place] will be sent to you for review.  There is a referral for pt for iron deficiency anemia due to chronic blood loss.    Will you accept this pt?

## 2018-10-08 ENCOUNTER — Inpatient Hospital Stay: Payer: 59 | Attending: Internal Medicine

## 2018-10-08 ENCOUNTER — Inpatient Hospital Stay: Payer: 59 | Admitting: Internal Medicine

## 2018-10-09 ENCOUNTER — Telehealth: Payer: Self-pay | Admitting: Internal Medicine

## 2018-10-09 NOTE — Telephone Encounter (Signed)
Called pt per 7/1 sch message - unable to reach pt . Left message for patient to call back to reschedule appt .

## 2018-11-03 MED FILL — PANTOPRAZOLE SOD DR 40 MG T: 40 | 30 days supply | Qty: 30 | Fill #1

## 2018-11-03 MED FILL — LEVOTHYROXINE 25 MCG TABLET: 25 | 30 days supply | Qty: 30 | Fill #1

## 2018-11-03 MED FILL — ELIQUIS 5 MG TABLET: 5 | 30 days supply | Qty: 60 | Fill #1

## 2018-11-12 DIAGNOSIS — L988 Other specified disorders of the skin and subcutaneous tissue: Secondary | ICD-10-CM | POA: Diagnosis not present

## 2018-11-12 DIAGNOSIS — Z1283 Encounter for screening for malignant neoplasm of skin: Secondary | ICD-10-CM | POA: Diagnosis not present

## 2018-11-12 DIAGNOSIS — D485 Neoplasm of uncertain behavior of skin: Secondary | ICD-10-CM | POA: Diagnosis not present

## 2018-11-12 DIAGNOSIS — D225 Melanocytic nevi of trunk: Secondary | ICD-10-CM | POA: Diagnosis not present

## 2018-11-12 DIAGNOSIS — Z08 Encounter for follow-up examination after completed treatment for malignant neoplasm: Secondary | ICD-10-CM | POA: Diagnosis not present

## 2018-11-12 DIAGNOSIS — L821 Other seborrheic keratosis: Secondary | ICD-10-CM | POA: Diagnosis not present

## 2018-11-12 DIAGNOSIS — Z85820 Personal history of malignant melanoma of skin: Secondary | ICD-10-CM | POA: Diagnosis not present

## 2018-11-20 ENCOUNTER — Telehealth: Payer: Self-pay | Admitting: *Deleted

## 2018-11-20 ENCOUNTER — Telehealth: Payer: Self-pay | Admitting: Internal Medicine

## 2018-11-20 ENCOUNTER — Other Ambulatory Visit: Payer: Self-pay | Admitting: Medical

## 2018-11-20 DIAGNOSIS — D500 Iron deficiency anemia secondary to blood loss (chronic): Secondary | ICD-10-CM

## 2018-11-20 NOTE — Telephone Encounter (Signed)
"  Rhonda Ford [id number]).  Called earlier about getting an appointment.  I am very sick.  Think I at least need iron.  Can you get me in for that?"   Connected with patient.  Scheduler request orders.   "I need more iron; I can barely walk.  I am nauseated, having shortness of breath."  Advised to report to ED.  "I do not want to go t the ED.  Dealing with bad anemia my whole life.  Today is day eleven of my cycle.  Normally average five to seven days.  No cycle the past eight weeks with menopaus so I guess it's catching up.  Really heavy bleeding, passing blood clots is starting to slow down.  I need iron every two months and have not had iron since March.  Do not think I need blood yet but my blood may be low."  Informed of Verbal orders received and read back from [place] for lab and [facility] visit tomorrow morning.  Report to ED as soon as possible if symptoms progress. Denies further needs or questions at this time.

## 2018-11-20 NOTE — Telephone Encounter (Signed)
Returned patient's phone call regarding scheduling an appointment, informed patient someone will get back in touch with her once we get approval of her request.

## 2018-11-21 ENCOUNTER — Other Ambulatory Visit: Payer: Self-pay | Admitting: Emergency Medicine

## 2018-11-21 ENCOUNTER — Telehealth: Payer: Self-pay | Admitting: Medical

## 2018-11-21 ENCOUNTER — Inpatient Hospital Stay: Payer: 59

## 2018-11-21 ENCOUNTER — Other Ambulatory Visit: Payer: Self-pay | Admitting: Lab

## 2018-11-21 ENCOUNTER — Other Ambulatory Visit: Payer: Self-pay

## 2018-11-21 ENCOUNTER — Inpatient Hospital Stay: Payer: 59 | Attending: Internal Medicine | Admitting: Medical

## 2018-11-21 VITALS — BP 136/87 | HR 68 | Temp 98.1°F | Resp 18 | Ht 64.0 in | Wt 192.2 lb

## 2018-11-21 DIAGNOSIS — N924 Excessive bleeding in the premenopausal period: Secondary | ICD-10-CM

## 2018-11-21 DIAGNOSIS — R5383 Other fatigue: Secondary | ICD-10-CM | POA: Diagnosis not present

## 2018-11-21 DIAGNOSIS — D500 Iron deficiency anemia secondary to blood loss (chronic): Secondary | ICD-10-CM

## 2018-11-21 DIAGNOSIS — Z86711 Personal history of pulmonary embolism: Secondary | ICD-10-CM | POA: Diagnosis not present

## 2018-11-21 DIAGNOSIS — E039 Hypothyroidism, unspecified: Secondary | ICD-10-CM

## 2018-11-21 DIAGNOSIS — Z85820 Personal history of malignant melanoma of skin: Secondary | ICD-10-CM | POA: Insufficient documentation

## 2018-11-21 DIAGNOSIS — R0602 Shortness of breath: Secondary | ICD-10-CM | POA: Insufficient documentation

## 2018-11-21 DIAGNOSIS — N920 Excessive and frequent menstruation with regular cycle: Secondary | ICD-10-CM | POA: Insufficient documentation

## 2018-11-21 DIAGNOSIS — Z79899 Other long term (current) drug therapy: Secondary | ICD-10-CM | POA: Diagnosis not present

## 2018-11-21 LAB — CBC WITH DIFFERENTIAL (CANCER CENTER ONLY)
Abs Immature Granulocytes: 0.01 10*3/uL (ref 0.00–0.07)
Basophils Absolute: 0 10*3/uL (ref 0.0–0.1)
Basophils Relative: 1 %
Eosinophils Absolute: 0.1 10*3/uL (ref 0.0–0.5)
Eosinophils Relative: 2 %
HCT: 21.2 % — ABNORMAL LOW (ref 36.0–46.0)
Hemoglobin: 6.5 g/dL — CL (ref 12.0–15.0)
Immature Granulocytes: 0 %
Lymphocytes Relative: 22 %
Lymphs Abs: 1.2 10*3/uL (ref 0.7–4.0)
MCH: 26.1 pg (ref 26.0–34.0)
MCHC: 30.7 g/dL (ref 30.0–36.0)
MCV: 85.1 fL (ref 80.0–100.0)
Monocytes Absolute: 0.4 10*3/uL (ref 0.1–1.0)
Monocytes Relative: 6 %
Neutro Abs: 3.8 10*3/uL (ref 1.7–7.7)
Neutrophils Relative %: 69 %
Platelet Count: 286 10*3/uL (ref 150–400)
RBC: 2.49 MIL/uL — ABNORMAL LOW (ref 3.87–5.11)
RDW: 17.1 % — ABNORMAL HIGH (ref 11.5–15.5)
WBC Count: 5.5 10*3/uL (ref 4.0–10.5)
nRBC: 0 % (ref 0.0–0.2)

## 2018-11-21 LAB — CMP (CANCER CENTER ONLY)
ALT: 15 U/L (ref 0–44)
AST: 14 U/L — ABNORMAL LOW (ref 15–41)
Albumin: 3.4 g/dL — ABNORMAL LOW (ref 3.5–5.0)
Alkaline Phosphatase: 69 U/L (ref 38–126)
Anion gap: 9 (ref 5–15)
BUN: 9 mg/dL (ref 6–20)
CO2: 24 mmol/L (ref 22–32)
Calcium: 8.5 mg/dL — ABNORMAL LOW (ref 8.9–10.3)
Chloride: 107 mmol/L (ref 98–111)
Creatinine: 0.67 mg/dL (ref 0.44–1.00)
GFR, Est AFR Am: >60 mL/min (ref >60)
GFR, Estimated: >60 mL/min (ref >60)
Glucose, Bld: 99 mg/dL (ref 70–99)
Potassium: 3.7 mmol/L (ref 3.5–5.1)
Sodium: 140 mmol/L (ref 135–145)
Total Bilirubin: 0.2 mg/dL — ABNORMAL LOW (ref 0.3–1.2)
Total Protein: 6.1 g/dL — ABNORMAL LOW (ref 6.5–8.1)

## 2018-11-21 LAB — IRON AND TIBC
Iron: 7 ug/dL — ABNORMAL LOW (ref 41–142)
Saturation Ratios: 2 % — ABNORMAL LOW (ref 21–57)
TIBC: 298 ug/dL (ref 236–444)
UIBC: 290 ug/dL (ref 120–384)

## 2018-11-21 LAB — FERRITIN: Ferritin: <4 ng/mL — ABNORMAL LOW (ref 11–307)

## 2018-11-21 LAB — PREPARE RBC (CROSSMATCH)

## 2018-11-21 LAB — SAMPLE TO BLOOD BANK

## 2018-11-21 MED ORDER — SODIUM CHLORIDE 0.9% FLUSH
3.0000 mL | INTRAVENOUS | Status: DC | PRN
  Filled 2018-11-21: qty 10

## 2018-11-21 MED ORDER — DIPHENHYDRAMINE HCL 25 MG PO CAPS
ORAL_CAPSULE | ORAL | Status: AC
  Filled 2018-11-21: qty 1

## 2018-11-21 MED ORDER — ACETAMINOPHEN 325 MG PO TABS
ORAL_TABLET | ORAL | Status: AC
  Filled 2018-11-21: qty 2

## 2018-11-21 MED ORDER — CYANOCOBALAMIN 1000 MCG/ML IJ SOLN
1000.0000 ug | Freq: Once | INTRAMUSCULAR | Status: AC
  Administered 2018-11-21: 11:00:00 1000 ug via INTRAMUSCULAR

## 2018-11-21 MED ORDER — DIPHENHYDRAMINE HCL 25 MG PO CAPS
25.0000 mg | ORAL_CAPSULE | Freq: Once | ORAL | Status: AC
  Administered 2018-11-21: 11:00:00 25 mg via ORAL

## 2018-11-21 MED ORDER — SODIUM CHLORIDE 0.9 % IV SOLN
Freq: Once | INTRAVENOUS | Status: AC
  Administered 2018-11-21: 11:00:00 via INTRAVENOUS
  Filled 2018-11-21: qty 250

## 2018-11-21 MED ORDER — SODIUM CHLORIDE 0.9% IV SOLUTION
250.0000 mL | Freq: Once | INTRAVENOUS | Status: DC
  Filled 2018-11-21: qty 250

## 2018-11-21 MED ORDER — ACETAMINOPHEN 325 MG PO TABS
650.0000 mg | ORAL_TABLET | Freq: Once | ORAL | Status: AC
  Administered 2018-11-21: 11:00:00 650 mg via ORAL

## 2018-11-21 MED ORDER — SODIUM CHLORIDE 0.9% FLUSH
10.0000 mL | INTRAVENOUS | Status: DC | PRN
  Filled 2018-11-21: qty 10

## 2018-11-21 MED ORDER — SODIUM CHLORIDE 0.9 % IV SOLN
510.0000 mg | Freq: Once | INTRAVENOUS | Status: AC
  Administered 2018-11-21: 510 mg via INTRAVENOUS
  Filled 2018-11-21: qty 17

## 2018-11-21 MED ORDER — CYANOCOBALAMIN 1000 MCG/ML IJ SOLN
INTRAMUSCULAR | Status: AC
  Filled 2018-11-21: qty 1

## 2018-11-21 NOTE — Progress Notes (Signed)
[facility] Progress Note   Rhonda Ford [id number] December 18, 1970 48 y.o.  Rhonda Ford was previously managed by Dr. Walden Field  Actively treated with chemotherapy/immunotherapy/hormonal therapy: no  Next scheduled appointment with provider: to be arranged  Assessment: Plan:    Iron deficiency anemia due to chronic blood loss   Iron deficiency anemia secondary to chronic blood loss: A CBC returned today with a hemoglobin of 6.5 and a hematocrit of 21.2.  Iron studies returned today showing an iron of 7 and an iron saturation of 2%.  This was discussed with Dr. Alen Blew.  The patient was transfused with 2 units of packed red blood cells today.  She additionally received Feraheme.   She is awaiting an appointment with a another provider.  She was previously seen by Dr. Walden Field.  She will return on 12/03/2018 to see this provider and to receive her second dose of Feraheme.  Please see After Visit Summary for patient specific instructions.  Future Appointments  Date Time Provider [place]  11/21/2018  9:45 AM [facility] LAB 1 [facility] None  11/21/2018 10:15 AM Tanner, Lyndon Code., PA-C [facility] None    No orders of the defined types were placed in this encounter.   Subjective:   Patient ID:  Rhonda Ford is a 48 y.o. (DOB 1970/05/02) female.  Chief Complaint: No chief complaint on file.   HPI Rhonda Ford   is a 48 year old female with a history of iron deficiency anemia who had previously been managed by Dr. Walden Field.  She missed her last appointment.  She is awaiting an appointment with another provider.  She has a history of heavy menses.  She continues to have heavy menses.  She presents today with a report of shortness of breath and progressive fatigue.  A CBC returned today with a hemoglobin of 6.5 and a hematocrit of 21.2.  Her hemoglobin 3 months ago was 11.3 and hematocrit was 35.4.  Iron studies returned today showing a total  iron of 7 and iron saturation of 2%.  She is a Marine scientist at [employer].  She missed work yesterday because of her fatigue.  She request a work excuse today.  Medications: I have reviewed the patient's current medications.  Allergies: Not on File  Past Medical History:  Diagnosis Date  . Iron deficiency anemia due to chronic blood loss   . Melanoma ([place])   . Pulmonary emboli ([place]) 01/2018    Past Surgical History:  Procedure Laterality Date  . gyn surgeries    . melanoma surgery      leg    Family History  Problem Relation Age of Onset  . Thrombosis Neg Hx     Social History   Socioeconomic History  . Marital status: Divorced    Spouse name: Not on file  . Number of children: Not on file  . Years of education: Not on file  . Highest education level: Not on file  Occupational History  . Not on file  Social Needs  . Financial resource strain: Not on file  . Food insecurity    Worry: Not on file    Inability: Not on file  . Transportation needs    Medical: Not on file    Non-medical: Not on file  Tobacco Use  . Smoking status: Never Smoker  . Smokeless tobacco: Never Used  Substance and Sexual Activity  . Alcohol use: Not Currently  . Drug use: Not Currently  . Sexual activity:  Not on file  Lifestyle  . Physical activity    Days per week: Not on file    Minutes per session: Not on file  . Stress: Not on file  Relationships  . Social Herbalist on phone: Not on file    Gets together: Not on file    Attends religious service: Not on file    Active member of club or organization: Not on file    Attends meetings of clubs or organizations: Not on file    Relationship status: Not on file  . Intimate partner violence    Fear of current or ex partner: Not on file    Emotionally abused: Not on file    Physically abused: Not on file    Forced sexual activity: Not on file  Other Topics Concern  . Not on file  Social History Narrative  . Not on  file    Past Medical History, Surgical history, Social history, and Family history were reviewed and updated as appropriate.   Please see review of systems for further details on the patient's review from today.   Review of Systems:  Review of Systems  Constitutional: Positive for fatigue. Negative for chills, diaphoresis and fever.  HENT: Negative for trouble swallowing and voice change.   Respiratory: Positive for shortness of breath. Negative for cough, chest tightness and wheezing.   Cardiovascular: Negative for chest pain and palpitations.  Gastrointestinal: Negative for abdominal pain, constipation, diarrhea, nausea and vomiting.  Genitourinary: Positive for menstrual problem and vaginal bleeding.  Musculoskeletal: Negative for back pain and myalgias.  Neurological: Negative for dizziness, light-headedness and headaches.    Objective:   Physical Exam:  There were no vitals taken for this visit. ECOG: 1  Physical Exam Constitutional:      General: She is not in acute distress.    Appearance: She is not diaphoretic.  HENT:     Head: Normocephalic and atraumatic.  Eyes:     Comments: Conjunctiva is pale.  Cardiovascular:     Rate and Rhythm: Normal rate and regular rhythm.     Heart sounds: Normal heart sounds. No murmur. No friction rub. No gallop.   Pulmonary:     Effort: Pulmonary effort is normal. No respiratory distress.     Breath sounds: Normal breath sounds. No wheezing or rales.  Abdominal:     General: Bowel sounds are normal. There is no distension.     Tenderness: There is no abdominal tenderness. There is no guarding.  Skin:    General: Skin is warm and dry.     Coloration: Skin is pale.     Findings: No erythema or rash.  Neurological:     Mental Status: She is alert.     Gait: Gait normal.     Lab Review:     Component Value Date/Time   NA 139 08/08/2018 0959   K 3.8 08/08/2018 0959   CL 107 08/08/2018 0959   CO2 24 08/08/2018 0959    GLUCOSE 99 08/08/2018 0959   BUN 18 08/08/2018 0959   CREATININE 0.72 08/08/2018 0959   CALCIUM 8.5 (L) 08/08/2018 0959   PROT 6.8 08/08/2018 0959   ALBUMIN 3.6 08/08/2018 0959   AST 11 (L) 08/08/2018 0959   ALT 15 08/08/2018 0959   ALKPHOS 75 08/08/2018 0959   BILITOT 0.3 08/08/2018 0959   GFRNONAA >60 08/08/2018 0959   GFRAA >60 08/08/2018 0959       Component Value Date/Time  WBC 5.4 08/08/2018 0959   WBC 5.6 06/19/2018 1125   RBC 3.84 (L) 08/08/2018 0959   HGB 11.3 (L) 08/08/2018 0959   HCT 35.4 (L) 08/08/2018 0959   PLT 295 08/08/2018 0959   MCV 92.2 08/08/2018 0959   MCH 29.4 08/08/2018 0959   MCHC 31.9 08/08/2018 0959   RDW 16.1 (H) 08/08/2018 0959   LYMPHSABS 1.3 08/08/2018 0959   MONOABS 0.4 08/08/2018 0959   EOSABS 0.1 08/08/2018 0959   BASOSABS 0.0 08/08/2018 0959   -------------------------------  Imaging from last 24 hours (if applicable):  Radiology interpretation: No results found.      This case was discussed Dr. Alen Blew. He expressed agreement with my management of this patient.

## 2018-11-21 NOTE — Progress Notes (Signed)
Received critical lab value of Hgb 6.5 from lab, PA [facility] aware.  Pt received IV feraheme and 2 units of PRBCs today, tolerated well.  Reports feeling better at end of tx.  Ate and drank during transfusions without any issues.  VSS.  Ambulatory w/steady gait to exit with belongings, driving self home.  Denies any questions or concerns at time of d/c.

## 2018-11-21 NOTE — Patient Instructions (Addendum)
Excuse from Work, [employer], or Physical Activity Rhonda Ford  needs to be excused from: X           Work.  This is effective for the following dates: 08/13 and 11/21/2018.  Health care provider name (printed): Sandi Mealy, MHS, PA-C   Health care provider (signature):    _________________________________________________________   Date: 11/21/2018  This information is not intended to replace advice given to you by your health care provider. Make sure you discuss any questions you have with your health care provider. Document Released: 09/19/2000 Document Revised: 03/21/2017 Document Reviewed: 03/21/2017 Elsevier Patient Education  [place].                  Ferumoxytol injection What is this medicine? FERUMOXYTOL is an iron complex. Iron is used to make healthy red blood cells, which carry oxygen and nutrients throughout the body. This medicine is used to treat iron deficiency anemia. This medicine may be used for other purposes; ask your health care provider or pharmacist if you have questions. COMMON BRAND NAME(S): Feraheme What should I tell my health care provider before I take this medicine? They need to know if you have any of these conditions:  anemia not caused by low iron levels  high levels of iron in the blood  magnetic resonance imaging (MRI) test scheduled  an unusual or allergic reaction to iron, other medicines, foods, dyes, or preservatives  pregnant or trying to get pregnant  breast-feeding How should I use this medicine? This medicine is for injection into a vein. It is given by a health care professional in a hospital or clinic setting. Talk to your pediatrician regarding the use of this medicine in children. Special care may be needed. Overdosage: If you think you have taken too much of this medicine contact a poison control center or emergency room at once. NOTE: This medicine is only for  you. Do not share this medicine with others. What if I miss a dose? It is important not to miss your dose. Call your doctor or health care professional if you are unable to keep an appointment. What may interact with this medicine? This medicine may interact with the following medications:  other iron products This list may not describe all possible interactions. Give your health care provider a list of all the medicines, herbs, non-prescription drugs, or dietary supplements you use. Also tell them if you smoke, drink alcohol, or use illegal drugs. Some items may interact with your medicine. What should I watch for while using this medicine? Visit your doctor or healthcare professional regularly. Tell your doctor or healthcare professional if your symptoms do not start to get better or if they get worse. You may need blood work done while you are taking this medicine. You may need to follow a special diet. Talk to your doctor. Foods that contain iron include: whole grains/cereals, dried fruits, beans, or peas, leafy green vegetables, and organ meats (liver, kidney). What side effects may I notice from receiving this medicine? Side effects that you should report to your doctor or health care professional as soon as possible:  allergic reactions like skin rash, itching or hives, swelling of the face, lips, or tongue  breathing problems  changes in blood pressure  feeling faint or lightheaded, falls  fever or chills  flushing, sweating, or hot feelings  swelling of the ankles or feet Side effects that usually  do not require medical attention (report to your doctor or health care professional if they continue or are bothersome):  diarrhea  headache  nausea, vomiting  stomach pain This list may not describe all possible side effects. Call your doctor for medical advice about side effects. You may report side effects to FDA at 1-800-FDA-1088. Where should I keep my medicine? This drug  is given in a hospital or clinic and will not be stored at home. NOTE: This sheet is a summary. It may not cover all possible information. If you have questions about this medicine, talk to your doctor, pharmacist, or health care provider.  2020 Elsevier/Gold Standard (2016-05-14 20:21:10)   Blood Transfusion, Adult, Care After This sheet gives you information about how to care for yourself after your procedure. Your doctor may also give you more specific instructions. If you have problems or questions, contact your doctor. Follow these instructions at home:   Take over-the-counter and prescription medicines only as told by your doctor.  Go back to your normal activities as told by your doctor.  Follow instructions from your doctor about how to take care of the area where an IV tube was put into your vein (insertion site). Make sure you: ? Wash your hands with soap and water before you change your bandage (dressing). If there is no soap and water, use hand sanitizer. ? Change your bandage as told by your doctor.  Check your IV insertion site every day for signs of infection. Check for: ? More redness, swelling, or pain. ? More fluid or blood. ? Warmth. ? Pus or a bad smell. Contact a doctor if:  You have more redness, swelling, or pain around the IV insertion site.  You have more fluid or blood coming from the IV insertion site.  Your IV insertion site feels warm to the touch.  You have pus or a bad smell coming from the IV insertion site.  Your pee (urine) turns pink, red, or brown.  You feel weak after doing your normal activities. Get help right away if:  You have signs of a serious allergic or body defense (immune) system reaction, including: ? Itchiness. ? Hives. ? Trouble breathing. ? Anxiety. ? Pain in your chest or lower back. ? Fever, flushing, and chills. ? Fast pulse. ? Rash. ? Watery poop (diarrhea). ? Throwing up (vomiting). ? Dark pee. ? Serious  headache. ? Dizziness. ? Stiff neck. ? Yellow color in your face or the white parts of your eyes (jaundice). Summary  After a blood transfusion, return to your normal activities as told by your doctor.  Every day, check for signs of infection where the IV tube was put into your vein.  Some signs of infection are warm skin, more redness and pain, more fluid or blood, and pus or a bad smell where the needle went in.  Contact your doctor if you feel weak or have any unusual symptoms. This information is not intended to replace advice given to you by your health care provider. Make sure you discuss any questions you have with your health care provider. Document Released: 04/16/2014 Document Revised: 11/18/2015 Document Reviewed: 11/18/2015 Elsevier Interactive Patient Education  [employer].

## 2018-11-21 NOTE — Telephone Encounter (Signed)
No los per 8/14.

## 2018-11-22 LAB — BPAM RBC
Blood Product Expiration Date: 202009092359
Blood Product Expiration Date: 202009102359
ISSUE DATE / TIME: 202008141232
ISSUE DATE / TIME: 202008141424
Unit Type and Rh: 5100
Unit Type and Rh: 5100

## 2018-11-22 LAB — TYPE AND SCREEN
ABO/RH(D): O POS
Antibody Screen: NEGATIVE
Unit division: 0
Unit division: 0

## 2018-11-24 ENCOUNTER — Other Ambulatory Visit: Payer: Self-pay | Admitting: Medical

## 2018-11-24 ENCOUNTER — Telehealth: Payer: Self-pay | Admitting: Medical

## 2018-11-24 ENCOUNTER — Telehealth: Payer: Self-pay | Admitting: Emergency Medicine

## 2018-11-24 ENCOUNTER — Telehealth: Payer: Self-pay | Admitting: Hematology

## 2018-11-24 DIAGNOSIS — D500 Iron deficiency anemia secondary to blood loss (chronic): Secondary | ICD-10-CM

## 2018-11-24 DIAGNOSIS — E039 Hypothyroidism, unspecified: Secondary | ICD-10-CM | POA: Insufficient documentation

## 2018-11-24 NOTE — Telephone Encounter (Signed)
Received call from pt asking if she had missed a call from [place].  According to chart notes the call was about her upcoming appts for August 26th and Sept 23rd.  Pt verbalized understanding of appt dates/times and denies any further questions at this time.

## 2018-11-24 NOTE — Telephone Encounter (Signed)
Scheduled appt per 8/17 sch message - unable to reach pt . Left message with appt date and time

## 2018-11-24 NOTE — Telephone Encounter (Signed)
[name] transfer to [place]. Left message re appointments for August September. Schedule mailed. Patient scheduled for lab/[facility]/infusion 8/26. Per Lucianne Lei patient can be scheduled to see new provider mid/late September.

## 2018-12-03 ENCOUNTER — Inpatient Hospital Stay: Payer: 59

## 2018-12-03 ENCOUNTER — Other Ambulatory Visit: Payer: Self-pay

## 2018-12-03 ENCOUNTER — Inpatient Hospital Stay (HOSPITAL_BASED_OUTPATIENT_CLINIC_OR_DEPARTMENT_OTHER): Payer: 59 | Admitting: Medical

## 2018-12-03 VITALS — BP 132/85 | HR 71 | Temp 98.7°F | Resp 16

## 2018-12-03 DIAGNOSIS — N920 Excessive and frequent menstruation with regular cycle: Secondary | ICD-10-CM | POA: Diagnosis not present

## 2018-12-03 DIAGNOSIS — D500 Iron deficiency anemia secondary to blood loss (chronic): Secondary | ICD-10-CM | POA: Diagnosis not present

## 2018-12-03 DIAGNOSIS — R0602 Shortness of breath: Secondary | ICD-10-CM | POA: Diagnosis not present

## 2018-12-03 DIAGNOSIS — Z86711 Personal history of pulmonary embolism: Secondary | ICD-10-CM | POA: Diagnosis not present

## 2018-12-03 DIAGNOSIS — R5383 Other fatigue: Secondary | ICD-10-CM | POA: Diagnosis not present

## 2018-12-03 DIAGNOSIS — Z85820 Personal history of malignant melanoma of skin: Secondary | ICD-10-CM | POA: Diagnosis not present

## 2018-12-03 DIAGNOSIS — Z79899 Other long term (current) drug therapy: Secondary | ICD-10-CM | POA: Diagnosis not present

## 2018-12-03 LAB — CBC WITH DIFFERENTIAL (CANCER CENTER ONLY)
Abs Immature Granulocytes: 0.01 10*3/uL (ref 0.00–0.07)
Basophils Absolute: 0 10*3/uL (ref 0.0–0.1)
Basophils Relative: 0 %
Eosinophils Absolute: 0.1 10*3/uL (ref 0.0–0.5)
Eosinophils Relative: 2 %
HCT: 35.4 % — ABNORMAL LOW (ref 36.0–46.0)
Hemoglobin: 11.2 g/dL — ABNORMAL LOW (ref 12.0–15.0)
Immature Granulocytes: 0 %
Lymphocytes Relative: 21 %
Lymphs Abs: 1.1 10*3/uL (ref 0.7–4.0)
MCH: 28.1 pg (ref 26.0–34.0)
MCHC: 31.6 g/dL (ref 30.0–36.0)
MCV: 88.7 fL (ref 80.0–100.0)
Monocytes Absolute: 0.3 10*3/uL (ref 0.1–1.0)
Monocytes Relative: 6 %
Neutro Abs: 3.7 10*3/uL (ref 1.7–7.7)
Neutrophils Relative %: 71 %
Platelet Count: 239 10*3/uL (ref 150–400)
RBC: 3.99 MIL/uL (ref 3.87–5.11)
RDW: 18.9 % — ABNORMAL HIGH (ref 11.5–15.5)
WBC Count: 5.2 10*3/uL (ref 4.0–10.5)
nRBC: 0 % (ref 0.0–0.2)

## 2018-12-03 MED ORDER — SODIUM CHLORIDE 0.9 % IV SOLN
510.0000 mg | Freq: Once | INTRAVENOUS | Status: AC
  Administered 2018-12-03: 510 mg via INTRAVENOUS
  Filled 2018-12-03: qty 510

## 2018-12-03 MED ORDER — SODIUM CHLORIDE 0.9 % IV SOLN
Freq: Once | INTRAVENOUS | Status: AC
  Administered 2018-12-03: 11:00:00 via INTRAVENOUS
  Filled 2018-12-03: qty 250

## 2018-12-03 NOTE — Patient Instructions (Signed)
Ferumoxytol injection What is this medicine? FERUMOXYTOL is an iron complex. Iron is used to make healthy red blood cells, which carry oxygen and nutrients throughout the body. This medicine is used to treat iron deficiency anemia. This medicine may be used for other purposes; ask your health care provider or pharmacist if you have questions. COMMON BRAND NAME(S): Feraheme What should I tell my health care provider before I take this medicine? They need to know if you have any of these conditions:  anemia not caused by low iron levels  high levels of iron in the blood  magnetic resonance imaging (MRI) test scheduled  an unusual or allergic reaction to iron, other medicines, foods, dyes, or preservatives  pregnant or trying to get pregnant  breast-feeding How should I use this medicine? This medicine is for injection into a vein. It is given by a health care professional in a hospital or clinic setting. Talk to your pediatrician regarding the use of this medicine in children. Special care may be needed. Overdosage: If you think you have taken too much of this medicine contact a poison control center or emergency room at once. NOTE: This medicine is only for you. Do not share this medicine with others. What if I miss a dose? It is important not to miss your dose. Call your doctor or health care professional if you are unable to keep an appointment. What may interact with this medicine? This medicine may interact with the following medications:  other iron products This list may not describe all possible interactions. Give your health care provider a list of all the medicines, herbs, non-prescription drugs, or dietary supplements you use. Also tell them if you smoke, drink alcohol, or use illegal drugs. Some items may interact with your medicine. What should I watch for while using this medicine? Visit your doctor or healthcare professional regularly. Tell your doctor or healthcare  professional if your symptoms do not start to get better or if they get worse. You may need blood work done while you are taking this medicine. You may need to follow a special diet. Talk to your doctor. Foods that contain iron include: whole grains/cereals, dried fruits, beans, or peas, leafy green vegetables, and organ meats (liver, kidney). What side effects may I notice from receiving this medicine? Side effects that you should report to your doctor or health care professional as soon as possible:  allergic reactions like skin rash, itching or hives, swelling of the face, lips, or tongue  breathing problems  changes in blood pressure  feeling faint or lightheaded, falls  fever or chills  flushing, sweating, or hot feelings  swelling of the ankles or feet Side effects that usually do not require medical attention (report to your doctor or health care professional if they continue or are bothersome):  diarrhea  headache  nausea, vomiting  stomach pain This list may not describe all possible side effects. Call your doctor for medical advice about side effects. You may report side effects to FDA at 1-800-FDA-1088. Where should I keep my medicine? This drug is given in a hospital or clinic and will not be stored at home. NOTE: This sheet is a summary. It may not cover all possible information. If you have questions about this medicine, talk to your doctor, pharmacist, or health care provider.  2020 Elsevier/Gold Standard (2016-05-14 20:21:10) Coronavirus (COVID-19) Are you at risk?  Are you at risk for the Coronavirus (COVID-19)?  To be considered HIGH RISK for Coronavirus (COVID-19),   you have to meet the following criteria:  . Traveled to [place], [place], [place], [place] or [place]; or in the [place] to [place], [place], [place], or [place]; and have fever, cough, and shortness of breath within the last 2 weeks of travel OR . Been in close contact with a person  diagnosed with COVID-19 within the last 2 weeks and have fever, cough, and shortness of breath . IF YOU DO NOT MEET THESE CRITERIA, YOU ARE CONSIDERED LOW RISK FOR COVID-19.  What to do if you are HIGH RISK for COVID-19?  . If you are having a medical emergency, call 911. . Seek medical care right away. Before you go to a doctor's office, urgent care or emergency department, call ahead and tell them about your recent travel, contact with someone diagnosed with COVID-19, and your symptoms. You should receive instructions from your physician's office regarding next steps of care.  . When you arrive at healthcare provider, tell the healthcare staff immediately you have returned from visiting [place], [place], [place], [place] or [place]; or traveled in the [place] to [place], [place], [place], or [place]; in the last two weeks or you have been in close contact with a person diagnosed with COVID-19 in the last 2 weeks.   . Tell the health care staff about your symptoms: fever, cough and shortness of breath. . After you have been seen by a medical provider, you will be either: o Tested for (COVID-19) and discharged home on quarantine except to seek medical care if symptoms worsen, and asked to  - Stay home and avoid contact with others until you get your results (4-5 days)  - Avoid travel on public transportation if possible (such as bus, train, or airplane) or o Sent to the Emergency Department by EMS for evaluation, COVID-19 testing, and possible admission depending on your condition and test results.  What to do if you are LOW RISK for COVID-19?  Reduce your risk of any infection by using the same precautions used for avoiding the common cold or flu:  . Wash your hands often with soap and warm water for at least 20 seconds.  If soap and water are not readily available, use an alcohol-based hand sanitizer with at least 60% alcohol.  . If coughing or sneezing, cover your mouth and nose by  coughing or sneezing into the elbow areas of your shirt or coat, into a tissue or into your sleeve (not your hands). . Avoid shaking hands with others and consider head nods or verbal greetings only. . Avoid touching your eyes, nose, or mouth with unwashed hands.  . Avoid close contact with people who are sick. . Avoid places or events with large numbers of people in one location, like concerts or sporting events. . Carefully consider travel plans you have or are making. . If you are planning any travel outside or inside the [place], visit the CDC's Travelers' Health webpage for the latest health notices. . If you have some symptoms but not all symptoms, continue to monitor at home and seek medical attention if your symptoms worsen. . If you are having a medical emergency, call 911.   ADDITIONAL HEALTHCARE OPTIONS FOR PATIENTS  [facility] / e-Visit: [email or web address]         [facility]: [phone]  [facility]: [phone]                   [facility]: [phone]   

## 2018-12-03 NOTE — Progress Notes (Signed)
[facility] Progress Note   Rhonda Ford [id number] 06-10-1970 48 y.o.  Rhonda Ford was previously managed by Dr. Walden Field  Actively treated with chemotherapy/immunotherapy/hormonal therapy: no  Next scheduled appointment with provider: to be arranged  Assessment: Plan:    No diagnosis found.   Iron deficiency anemia secondary to chronic blood loss: Ms. Needleman presents today having last been seen on 11/20/2018. At that time a CBC returned with a hemoglobin of 6.5 and a hematocrit of 21.2.  Iron studiesshowed an iron of 7 and an iron saturation of 2%.  She was transfused with 2 units of packed red blood cells and dosed with Feraheme.   She is receiving a second dose of Feraheme today0. She presents today for follow up with her labs today showing a hemoglobin of 11.2 and a hematocrit of 35.4.[name] She is scheduled to be seen by Dr. Truitt Merle on 12/31/2018.  Please see After Visit Summary for patient specific instructions.  Future Appointments  Date Time Provider [place]  12/03/2018 10:00 AM [facility] LAB 1 [facility] None  12/03/2018 10:30 AM Tanner, Lucianne Lei E., PA-C [facility] None  12/03/2018 11:15 AM [facility] INFUSION [facility] None  12/31/2018  1:00 PM Truitt Merle, MD [facility] None    No orders of the defined types were placed in this encounter.   Subjective:   Patient ID:  Rhonda Ford is a 48 y.o. (DOB 10-23-1970) female.  Chief Complaint: No chief complaint on file.   HPI Rhonda Ford   is a 48 year old female with a history of iron deficiency anemia who had previously been managed by Dr. Walden Field. Ms. Fagerberg presents today having last been seen on 11/20/2018. At that time a CBC returned with a hemoglobin of 6.5 and a hematocrit of 21.2.  Iron studiesshowed an iron of 7 and an iron saturation of 2%.  She was transfused with 2 units of packed red blood cells and dosed with Feraheme.   She is receiving a second  dose of Feraheme today.  She is scheduled to be seen by Dr. Truitt Merle on 12/31/2018. She continues to have heavy menses.  Her fatigue and shortness of breath have improved since her last visit.  She reports that she feels that her blood counts are better.  She is scheduled to see Dr. Domenick Gong who is a hematologist limited in the next couple of weeks.  She is seeing him regarding her history of a blood clot.  Medications: I have reviewed the patient's current medications.  Allergies: No Known Allergies  Past Medical History:  Diagnosis Date  . Iron deficiency anemia due to chronic blood loss   . Melanoma ([place])   . Pulmonary emboli ([place]) 01/2018    Past Surgical History:  Procedure Laterality Date  . gyn surgeries    . melanoma surgery      leg    Family History  Problem Relation Age of Onset  . Thrombosis Neg Hx     Social History   Socioeconomic History  . Marital status: Divorced    Spouse name: Not on file  . Number of children: Not on file  . Years of education: Not on file  . Highest education level: Not on file  Occupational History  . Not on file  Social Needs  . Financial resource strain: Not on file  . Food insecurity    Worry: Not on file    Inability: Not on file  . Transportation needs    Medical:  Not on file    Non-medical: Not on file  Tobacco Use  . Smoking status: Never Smoker  . Smokeless tobacco: Never Used  Substance and Sexual Activity  . Alcohol use: Not Currently  . Drug use: Not Currently  . Sexual activity: Not on file  Lifestyle  . Physical activity    Days per week: Not on file    Minutes per session: Not on file  . Stress: Not on file  Relationships  . Social Herbalist on phone: Not on file    Gets together: Not on file    Attends religious service: Not on file    Active member of club or organization: Not on file    Attends meetings of clubs or organizations: Not on file    Relationship status: Not on file  . Intimate  partner violence    Fear of current or ex partner: Not on file    Emotionally abused: Not on file    Physically abused: Not on file    Forced sexual activity: Not on file  Other Topics Concern  . Not on file  Social History Narrative  . Not on file    Past Medical History, Surgical history, Social history, and Family history were reviewed and updated as appropriate.   Please see review of systems for further details on the patient's review from today.   Review of Systems:  Review of Systems  Constitutional: Negative for chills, diaphoresis, fatigue and fever.  HENT: Negative for trouble swallowing and voice change.   Respiratory: Negative for cough, chest tightness, shortness of breath and wheezing.   Cardiovascular: Negative for chest pain and palpitations.  Gastrointestinal: Negative for abdominal pain, constipation, diarrhea, nausea and vomiting.  Genitourinary: Positive for menstrual problem and vaginal bleeding.  Musculoskeletal: Negative for back pain and myalgias.  Neurological: Negative for dizziness, light-headedness and headaches.    Objective:   Physical Exam:  There were no vitals taken for this visit. ECOG: 1  Physical Exam Constitutional:      General: She is not in acute distress.    Appearance: She is not ill-appearing or diaphoretic.  HENT:     Head: Normocephalic and atraumatic.  Eyes:     General: No scleral icterus.       Right eye: No discharge.        Left eye: No discharge.     Conjunctiva/sclera: Conjunctivae normal.     Comments: Conjunctiva is pale.  Cardiovascular:     Rate and Rhythm: Normal rate and regular rhythm.     Heart sounds: Normal heart sounds. No murmur. No friction rub. No gallop.   Pulmonary:     Effort: Pulmonary effort is normal. No respiratory distress.     Breath sounds: Normal breath sounds. No wheezing or rales.  Skin:    General: Skin is warm and dry.     Coloration: Skin is not pale.     Findings: No erythema or  rash.  Neurological:     Mental Status: She is alert.     Lab Review:     Component Value Date/Time   NA 140 11/21/2018 0935   K 3.7 11/21/2018 0935   CL 107 11/21/2018 0935   CO2 24 11/21/2018 0935   GLUCOSE 99 11/21/2018 0935   BUN 9 11/21/2018 0935   CREATININE 0.67 11/21/2018 0935   CALCIUM 8.5 (L) 11/21/2018 0935   PROT 6.1 (L) 11/21/2018 0935   ALBUMIN 3.4 (L) 11/21/2018 0935  AST 14 (L) 11/21/2018 0935   ALT 15 11/21/2018 0935   ALKPHOS 69 11/21/2018 0935   BILITOT 0.2 (L) 11/21/2018 0935   GFRNONAA >60 11/21/2018 0935   GFRAA >60 11/21/2018 0935       Component Value Date/Time   WBC 5.5 11/21/2018 0935   WBC 5.6 06/19/2018 1125   RBC 2.49 (L) 11/21/2018 0935   HGB 6.5 (LL) 11/21/2018 0935   HCT 21.2 (L) 11/21/2018 0935   PLT 286 11/21/2018 0935   MCV 85.1 11/21/2018 0935   MCH 26.1 11/21/2018 0935   MCHC 30.7 11/21/2018 0935   RDW 17.1 (H) 11/21/2018 0935   LYMPHSABS 1.2 11/21/2018 0935   MONOABS 0.4 11/21/2018 0935   EOSABS 0.1 11/21/2018 0935   BASOSABS 0.0 11/21/2018 0935   -------------------------------  Imaging from last 24 hours (if applicable):  Radiology interpretation: No results found.

## 2018-12-08 DIAGNOSIS — I2699 Other pulmonary embolism without acute cor pulmonale: Secondary | ICD-10-CM | POA: Diagnosis not present

## 2018-12-08 DIAGNOSIS — D500 Iron deficiency anemia secondary to blood loss (chronic): Secondary | ICD-10-CM | POA: Diagnosis not present

## 2018-12-26 ENCOUNTER — Other Ambulatory Visit: Payer: Self-pay | Admitting: Internal Medicine

## 2018-12-26 MED FILL — PANTOPRAZOLE SOD DR 40 MG T: 40 | 30 days supply | Qty: 30 | Fill #2

## 2018-12-26 MED FILL — LEVOTHYROXINE 25 MCG TABLET: 25 | 30 days supply | Qty: 30 | Fill #2

## 2018-12-26 NOTE — Progress Notes (Signed)
[facility]   [phone] Fax:([phone]   Clinic Follow up Note   Patient Care Team: Rochel Brome, MD as PCP - General (Family Medicine)  Date of Service:  12/31/2018  CHIEF COMPLAINT: F/u of PE and iron deficient anemia   CURRENT THERAPY:  IV iron as needed, oral iron and oral B12.   INTERVAL HISTORY:  Rhonda Ford is here for a follow up of PE and iron deficient anemia. She was previously under the care of Dr. Walden Field and has transferred her care to me. She saw PA Vann in interim when she had bleeding. She notes she also saw Dr. Altamese Cabal on 12/08/18. He said her PE was likely related to her anemia. They reviewed her constipation. He recommended she continue Eliquis indefinitely because she likely had other PE in the past. He feels her body does not absorbing iron which is why she vomits on oral iron. She is able to tolerate extended release iron which still is not helping. She has been given IV iron 4-5 times this year since 06/2018. PA Eliseo Squires gave her B12 injection in 11/2018.   She notes before 2019 Thanksgiving she lived in [place] a month before then she had PE in 01/2018. She was seen by ER who discussed her PE. She was hospitalized and was a right peripheral. She was treated with blood and heparin drip. She was discharged on Eliquis. She completed loading dose and then onto regular dose. She has tried repeatedly to obtain medical records from [place] given here.   She notes she has had iron deficient anemia since being a young kid. She notes her grandmother also had it. She did not go to doctor for it. This was never addressed by her PCP. The lowest was in 7.5. She has had chronic fatigue with good range in 9-10. She has not seen by hematologist until her PE. She notes she has had colonoscopy and EGD in 01/2018. She had a stomach ulcer found but no other signs of bleeding.  She has history of menorrhagia. Since 04/2018 her period are irregular and  occasionally lighter. Her Gyn thinks she is likely perimenopausal.    Socially she has 5 children which 3 of them has medical conditions. 4 of them are still living with her. 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 She notes she moved her form [place] to help her mother after she had several strokes.  She denies significant PMHx. Her MGF had bone cancer. her Maternal aunt had breast cancer and non-hodgkin lymphoma in her 4s. Her PGM had melanoma and her paternal uncle had melanoma. She had superficial melanoma of her left lateral thigh in the past in 07/2017.   REVIEW OF SYSTEMS:   Constitutional: Denies fevers, chills or abnormal weight loss Eyes: Denies blurriness of vision Ears, nose, mouth, throat, and face: Denies mucositis or sore throat Respiratory: Denies cough, dyspnea or wheezes Cardiovascular: Denies palpitation, chest discomfort or lower extremity swelling Gastrointestinal:  Denies nausea, heartburn or change in bowel habits Skin: Denies abnormal skin rashes Lymphatics: Denies new lymphadenopathy or easy bruising Neurological:Denies numbness, tingling or new weaknesses Behavioral/Psych: Mood is stable, no new changes  All other systems were reviewed with the patient and are negative.  MEDICAL HISTORY:  Past Medical History:  Diagnosis Date  . Iron deficiency anemia due to chronic blood loss   . Melanoma ([place])   . Pulmonary emboli ([place]) 01/2018  . Skin cancer     SURGICAL HISTORY: Past Surgical  History:  Procedure Laterality Date  . gyn surgeries    . melanoma surgery      leg    I have reviewed the social history and family history with the patient and they are unchanged from previous note.  ALLERGIES:  has No Known Allergies.  MEDICATIONS:  Current Outpatient Medications  Medication Sig Dispense Refill  . apixaban (ELIQUIS) 5 MG TABS tablet Take 1 tablet (5 mg total) by mouth 2 (two) times daily. 60 tablet 11  . Ferrous Sulfate (SLOW FE PO) Take by mouth.     . levothyroxine (SYNTHROID) 25 MCG tablet Take 1 tablet (25 mcg total) by mouth daily before breakfast. 30 tablet 2  . pantoprazole (PROTONIX) 20 MG tablet Take 1 tablet (20 mg total) by mouth 2 (two) times daily. 30 tablet 2   No current facility-administered medications for this visit.     PHYSICAL EXAMINATION: ECOG PERFORMANCE STATUS: 0 - Asymptomatic  Vitals:   12/31/18 1207  BP: (!) 157/90  Pulse: 60  Resp: 20  Temp: 98.3 F (36.8 C)  SpO2: 100%   Filed Weights   12/31/18 1207  Weight: 195 lb 8 oz (88.7 kg)    GENERAL:alert, no distress and comfortable SKIN: skin color, texture, turgor are normal, no rashes or significant lesions EYES: normal, Conjunctiva are pink and non-injected, sclera clear  NECK: supple, thyroid normal size, non-tender, without nodularity LYMPH:  no palpable lymphadenopathy in the cervical, axillary  LUNGS: clear to auscultation and percussion with normal breathing effort HEART: regular rate & rhythm and no murmurs and no lower extremity edema ABDOMEN:abdomen soft, non-tender and normal bowel sounds Musculoskeletal:no cyanosis of digits and no clubbing  NEURO: alert & oriented x 3 with fluent speech, no focal motor/sensory deficits  LABORATORY DATA:  I have reviewed the data as listed CBC Latest Ref Rng & Units 12/31/2018 12/03/2018 11/21/2018  WBC 4.0 - 10.5 K/uL 5.3 5.2 5.5  Hemoglobin 12.0 - 15.0 g/dL 12.7 11.2(L) 6.5(LL)  Hematocrit 36.0 - 46.0 % 38.9 35.4(L) 21.2(L)  Platelets 150 - 400 K/uL 233 239 286     CMP Latest Ref Rng & Units 11/21/2018 08/08/2018 06/19/2018  Glucose 70 - 99 mg/dL 99 99 89  BUN 6 - 20 mg/dL 9 18 20   Creatinine 0.44 - 1.00 mg/dL 0.67 0.72 0.83  Sodium 135 - 145 mmol/L 140 139 139  Potassium 3.5 - 5.1 mmol/L 3.7 3.8 4.2  Chloride 98 - 111 mmol/L 107 107 107  CO2 22 - 32 mmol/L 24 24 22   Calcium 8.9 - 10.3 mg/dL 8.5(L) 8.5(L) 8.8(L)  Total Protein 6.5 - 8.1 g/dL 6.1(L) 6.8 7.4  Total Bilirubin 0.3 - 1.2 mg/dL 0.2(L)  0.3 0.5  Alkaline Phos 38 - 126 U/L 69 75 82  AST 15 - 41 U/L 14(L) 11(L) 15  ALT 0 - 44 U/L 15 15 14       RADIOGRAPHIC STUDIES: I have personally reviewed the radiological images as listed and agreed with the findings in the report. No results found.   ASSESSMENT & PLAN:  KARROL PEROT is a 48 y.o. female with    1. Iron deficiency anemia, B12 deficiency -Likely secondary to menorrhagia. Unclear the etiology about her B12 deficiency  -Per patient she has been anemic ranging from 7.5-10 since she was an adolescent with chronic fatigue. This had not been worked up until her PE in late 2019.  -Workup done by Dr. Walden Field in 06/2018 is consistent with iron deficiency anemia.  -Her 01/2018  colonoscopy and EGD which was normal except stomach ulcer, no H. Pylorie. She was started on Protonix.  -She has had long history of menorrhagia which is the likely cause of her anemia.  -Since 04/2018 her periods have been irregular and occasionally lighter. She feels she may be perimenopausal. If so her bleeding should improve which will her anemia.  -She also has B12 deficiency based on lab in early 2020. She has had no gastric or GI surgeries. She has received B12 injection on 06/25/18 and 11/21/18. I encouraged her to start OTC B12 once daily.  -Labs reviewed today, CBC and retic panel WNL. Iron penal is still pending.  -She is on extended release oral iron as she could not tolerate regular dose. She has had 2 doses of IV Feraheme in 06/2018 and in 11/2018.  -I will check for genetic causes for anemia in the future.  -Will monitor with monthly labs and continue B12 injection monthly for now. F/u in 3 months.  -We will check intrinsic factor antibody next months, to rule out pernicious anemia -She has not been able to find PCP since moving to [place]. I will refill her medication only this time as I encouraged her to find PCP soon.    2. H/o Pulmonary Emboli, Unprovoked  -In 01/2018. She has been  treated with Eliquis since 06/2018.  -Given this was significant, unprovoked PE and no family history of PE, it has been recommended to stay on  -No PE seen on 07/03/18 Angio CT chest.  -She did another bout of menorrhagia but did not stop Eliquis given her concern for another PE.  -I encouraged her to watch her weight, stay active, and not smoke which can increase risk of blood clots. For long distance traveling she can wear compression socks if needed. For surgery will hold Eliquis around procedure.    3. 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 She is followed by dermatology who she saw when she lived in [place].   PLAN:  -Proceed with B12 injection today and continue monthly  -I refilled Protonix and Synthroid today  -I encouraged her to contact her GI about stopping Protonix -I encourage her to find PCP  -Start OTC B12 daily, continue extended release oral iron  -Lab and B12 injection monthly X3  -iv feraheme as needed  -F/u in 3 months    No problem-specific Assessment & Plan notes found for this encounter.   No orders of the defined types were placed in this encounter.  All questions were answered. The patient knows to call the clinic with any problems, questions or concerns. No barriers to learning was detected. I spent 25 minutes counseling the patient face to face. The total time spent in the appointment was 30 minutes and more than 50% was on counseling and review of test results     Truitt Merle, MD 12/31/2018   I, Joslyn Devon, am acting as scribe for Truitt Merle, MD.   I have reviewed the above documentation for accuracy and completeness, and I agree with the above.

## 2018-12-29 NOTE — Telephone Encounter (Signed)
New pt for you. You will be seeing her on 12/31/18. Former pt of Dr. Walden Field

## 2018-12-30 ENCOUNTER — Other Ambulatory Visit: Payer: Self-pay | Admitting: Hematology

## 2018-12-30 DIAGNOSIS — D500 Iron deficiency anemia secondary to blood loss (chronic): Secondary | ICD-10-CM

## 2018-12-31 ENCOUNTER — Inpatient Hospital Stay: Payer: 59

## 2018-12-31 ENCOUNTER — Inpatient Hospital Stay: Payer: 59 | Attending: Internal Medicine | Admitting: Hematology

## 2018-12-31 ENCOUNTER — Other Ambulatory Visit: Payer: Self-pay

## 2018-12-31 ENCOUNTER — Encounter: Payer: Self-pay | Admitting: Hematology

## 2018-12-31 VITALS — BP 157/90 | HR 60 | Temp 98.3°F | Resp 20 | Ht 64.0 in | Wt 195.5 lb

## 2018-12-31 DIAGNOSIS — E538 Deficiency of other specified B group vitamins: Secondary | ICD-10-CM | POA: Diagnosis not present

## 2018-12-31 DIAGNOSIS — D500 Iron deficiency anemia secondary to blood loss (chronic): Secondary | ICD-10-CM | POA: Diagnosis not present

## 2018-12-31 DIAGNOSIS — N920 Excessive and frequent menstruation with regular cycle: Secondary | ICD-10-CM | POA: Diagnosis not present

## 2018-12-31 DIAGNOSIS — Z86718 Personal history of other venous thrombosis and embolism: Secondary | ICD-10-CM

## 2018-12-31 DIAGNOSIS — Z7901 Long term (current) use of anticoagulants: Secondary | ICD-10-CM | POA: Insufficient documentation

## 2018-12-31 DIAGNOSIS — D509 Iron deficiency anemia, unspecified: Secondary | ICD-10-CM | POA: Insufficient documentation

## 2018-12-31 DIAGNOSIS — Z79899 Other long term (current) drug therapy: Secondary | ICD-10-CM | POA: Insufficient documentation

## 2018-12-31 DIAGNOSIS — K5900 Constipation, unspecified: Secondary | ICD-10-CM | POA: Insufficient documentation

## 2018-12-31 DIAGNOSIS — Z86711 Personal history of pulmonary embolism: Secondary | ICD-10-CM | POA: Diagnosis not present

## 2018-12-31 DIAGNOSIS — E039 Hypothyroidism, unspecified: Secondary | ICD-10-CM

## 2018-12-31 DIAGNOSIS — Z85820 Personal history of malignant melanoma of skin: Secondary | ICD-10-CM | POA: Insufficient documentation

## 2018-12-31 DIAGNOSIS — Z85828 Personal history of other malignant neoplasm of skin: Secondary | ICD-10-CM | POA: Diagnosis not present

## 2018-12-31 LAB — CBC WITH DIFFERENTIAL (CANCER CENTER ONLY)
Abs Immature Granulocytes: 0.01 10*3/uL (ref 0.00–0.07)
Basophils Absolute: 0 10*3/uL (ref 0.0–0.1)
Basophils Relative: 0 %
Eosinophils Absolute: 0.1 10*3/uL (ref 0.0–0.5)
Eosinophils Relative: 1 %
HCT: 38.9 % (ref 36.0–46.0)
Hemoglobin: 12.7 g/dL (ref 12.0–15.0)
Immature Granulocytes: 0 %
Lymphocytes Relative: 24 %
Lymphs Abs: 1.3 10*3/uL (ref 0.7–4.0)
MCH: 29.8 pg (ref 26.0–34.0)
MCHC: 32.6 g/dL (ref 30.0–36.0)
MCV: 91.3 fL (ref 80.0–100.0)
Monocytes Absolute: 0.4 10*3/uL (ref 0.1–1.0)
Monocytes Relative: 7 %
Neutro Abs: 3.6 10*3/uL (ref 1.7–7.7)
Neutrophils Relative %: 68 %
Platelet Count: 233 10*3/uL (ref 150–400)
RBC: 4.26 MIL/uL (ref 3.87–5.11)
RDW: 15.8 % — ABNORMAL HIGH (ref 11.5–15.5)
WBC Count: 5.3 10*3/uL (ref 4.0–10.5)
nRBC: 0 % (ref 0.0–0.2)

## 2018-12-31 LAB — RETIC PANEL
Immature Retic Fract: 8.3 % (ref 2.3–15.9)
RBC.: 4.24 MIL/uL (ref 3.87–5.11)
Retic Count, Absolute: 65.7 10*3/uL (ref 19.0–186.0)
Retic Ct Pct: 1.6 % (ref 0.4–3.1)
Reticulocyte Hemoglobin: 35.6 pg (ref >27.9)

## 2018-12-31 LAB — IRON AND TIBC
Iron: 49 ug/dL (ref 41–142)
Saturation Ratios: 20 % — ABNORMAL LOW (ref 21–57)
TIBC: 246 ug/dL (ref 236–444)
UIBC: 197 ug/dL (ref 120–384)

## 2018-12-31 LAB — FERRITIN: Ferritin: 52 ng/mL (ref 11–307)

## 2018-12-31 MED ORDER — PANTOPRAZOLE SODIUM 20 MG PO TBEC: 20.0000 mg | DELAYED_RELEASE_TABLET | Freq: Two times a day (BID) | ORAL | 2 refills | Status: DC

## 2018-12-31 MED ORDER — LEVOTHYROXINE SODIUM 25 MCG PO TABS: 25.0000 ug | ORAL_TABLET | Freq: Every day | ORAL | 2 refills | Status: DC

## 2018-12-31 MED ORDER — CYANOCOBALAMIN 1000 MCG/ML IJ SOLN
INTRAMUSCULAR | Status: AC
  Filled 2018-12-31: qty 1

## 2018-12-31 MED ORDER — APIXABAN 5 MG PO TABS: 5.0000 mg | ORAL_TABLET | Freq: Two times a day (BID) | ORAL | 11 refills | Status: DC

## 2018-12-31 MED ORDER — CYANOCOBALAMIN 1000 MCG/ML IJ SOLN
1000.0000 ug | Freq: Once | INTRAMUSCULAR | Status: AC
  Administered 2018-12-31: 13:00:00 1000 ug via INTRAMUSCULAR

## 2018-12-31 MED FILL — ELIQUIS 5 MG TABLET: 5 | 30 days supply | Qty: 60 | Fill #0

## 2019-01-01 ENCOUNTER — Telehealth: Payer: Self-pay | Admitting: Hematology

## 2019-01-01 NOTE — Telephone Encounter (Signed)
Scheduled appt per 9/23 los.  Scheduled based off of patient work schedule.  Patient aware of the appt dates and time.

## 2019-01-05 ENCOUNTER — Telehealth: Payer: Self-pay

## 2019-01-05 NOTE — Telephone Encounter (Signed)
TC to pt per Dr Burr Medico to let her know that her iron study was good last week. No further problems or concerns at this time.

## 2019-01-20 ENCOUNTER — Other Ambulatory Visit: Payer: Self-pay | Admitting: Medical

## 2019-01-20 MED ORDER — APIXABAN 5 MG PO TABS: 5.0000 mg | ORAL_TABLET | Freq: Two times a day (BID) | ORAL | 4 refills | Status: DC

## 2019-01-30 DIAGNOSIS — N750 Cyst of Bartholin's gland: Secondary | ICD-10-CM | POA: Diagnosis not present

## 2019-01-30 MED FILL — DOXYCYCLINE HYC 100 MG CAPS: 100 | 7 days supply | Qty: 14 | Fill #0

## 2019-02-02 DIAGNOSIS — N751 Abscess of Bartholin's gland: Secondary | ICD-10-CM | POA: Diagnosis not present

## 2019-02-03 ENCOUNTER — Other Ambulatory Visit: Payer: Self-pay

## 2019-02-03 ENCOUNTER — Inpatient Hospital Stay: Payer: 59

## 2019-02-03 ENCOUNTER — Inpatient Hospital Stay: Payer: 59 | Attending: Internal Medicine

## 2019-02-03 VITALS — BP 135/89 | HR 73 | Temp 98.0°F | Resp 19

## 2019-02-03 DIAGNOSIS — D500 Iron deficiency anemia secondary to blood loss (chronic): Secondary | ICD-10-CM | POA: Diagnosis not present

## 2019-02-03 DIAGNOSIS — E538 Deficiency of other specified B group vitamins: Secondary | ICD-10-CM | POA: Diagnosis not present

## 2019-02-03 DIAGNOSIS — N920 Excessive and frequent menstruation with regular cycle: Secondary | ICD-10-CM | POA: Diagnosis not present

## 2019-02-03 DIAGNOSIS — Z86718 Personal history of other venous thrombosis and embolism: Secondary | ICD-10-CM

## 2019-02-03 LAB — RETIC PANEL
Immature Retic Fract: 14.3 % (ref 2.3–15.9)
RBC.: 3.95 MIL/uL (ref 3.87–5.11)
Retic Count, Absolute: 65.6 10*3/uL (ref 19.0–186.0)
Retic Ct Pct: 1.7 % (ref 0.4–3.1)
Reticulocyte Hemoglobin: 34.1 pg (ref >27.9)

## 2019-02-03 LAB — IRON AND TIBC
Iron: 63 ug/dL (ref 41–142)
Saturation Ratios: 28 % (ref 21–57)
TIBC: 221 ug/dL — ABNORMAL LOW (ref 236–444)
UIBC: 159 ug/dL (ref 120–384)

## 2019-02-03 LAB — CBC WITH DIFFERENTIAL (CANCER CENTER ONLY)
Abs Immature Granulocytes: 0.03 10*3/uL (ref 0.00–0.07)
Basophils Absolute: 0 10*3/uL (ref 0.0–0.1)
Basophils Relative: 0 %
Eosinophils Absolute: 0.1 10*3/uL (ref 0.0–0.5)
Eosinophils Relative: 1 %
HCT: 35.6 % — ABNORMAL LOW (ref 36.0–46.0)
Hemoglobin: 11.8 g/dL — ABNORMAL LOW (ref 12.0–15.0)
Immature Granulocytes: 0 %
Lymphocytes Relative: 19 %
Lymphs Abs: 1.3 10*3/uL (ref 0.7–4.0)
MCH: 29.8 pg (ref 26.0–34.0)
MCHC: 33.1 g/dL (ref 30.0–36.0)
MCV: 89.9 fL (ref 80.0–100.0)
Monocytes Absolute: 0.4 10*3/uL (ref 0.1–1.0)
Monocytes Relative: 6 %
Neutro Abs: 4.9 10*3/uL (ref 1.7–7.7)
Neutrophils Relative %: 74 %
Platelet Count: 290 10*3/uL (ref 150–400)
RBC: 3.96 MIL/uL (ref 3.87–5.11)
RDW: 14.1 % (ref 11.5–15.5)
WBC Count: 6.7 10*3/uL (ref 4.0–10.5)
nRBC: 0 % (ref 0.0–0.2)

## 2019-02-03 LAB — FERRITIN: Ferritin: 47 ng/mL (ref 11–307)

## 2019-02-03 MED ORDER — CYANOCOBALAMIN 1000 MCG/ML IJ SOLN
INTRAMUSCULAR | Status: AC
  Filled 2019-02-03: qty 1

## 2019-02-03 MED ORDER — CYANOCOBALAMIN 1000 MCG/ML IJ SOLN
1000.0000 ug | Freq: Once | INTRAMUSCULAR | Status: AC
  Administered 2019-02-03: 1000 ug via INTRAMUSCULAR

## 2019-02-03 NOTE — Patient Instructions (Signed)

## 2019-02-04 ENCOUNTER — Telehealth: Payer: Self-pay

## 2019-02-04 LAB — INTRINSIC FACTOR ANTIBODIES: Intrinsic Factor: 1.1 AU/mL (ref 0.0–1.1)

## 2019-02-04 NOTE — Telephone Encounter (Signed)
Questions for Screening COVID-19  Symptom onset: N/a  Travel or Contacts: No  During this illness, did/does the patient experience any of the following symptoms? Fever >100.22F []   Yes [x]   No []   Unknown Subjective fever (felt feverish) []   Yes [x]   No []   Unknown Chills []   Yes [x]   No []   Unknown Muscle aches (myalgia) []   Yes [x]   No []   Unknown Runny nose (rhinorrhea) []   Yes [x]   No []   Unknown Sore throat []   Yes [x]   No []   Unknown Cough (new onset or worsening of chronic cough) []   Yes [x]   No []   Unknown Shortness of breath (dyspnea) []   Yes [x]   No []   Unknown Nausea or vomiting []   Yes [x]   No []   Unknown Headache []   Yes []   No [x]   Unknown Abdominal pain  []   Yes [x]   No []   Unknown Diarrhea (?3 loose/looser than normal stools/24hr period) []   Yes [x]   No []   Unknown

## 2019-02-05 ENCOUNTER — Ambulatory Visit: Payer: 59 | Admitting: Family Medicine

## 2019-02-05 ENCOUNTER — Other Ambulatory Visit: Payer: Self-pay

## 2019-02-05 ENCOUNTER — Encounter: Payer: Self-pay | Admitting: Family Medicine

## 2019-02-05 VITALS — BP 132/90 | HR 73 | Temp 98.2°F | Ht 64.0 in | Wt 196.6 lb

## 2019-02-05 DIAGNOSIS — Z6833 Body mass index (BMI) 33.0-33.9, adult: Secondary | ICD-10-CM

## 2019-02-05 DIAGNOSIS — E039 Hypothyroidism, unspecified: Secondary | ICD-10-CM

## 2019-02-05 DIAGNOSIS — E538 Deficiency of other specified B group vitamins: Secondary | ICD-10-CM

## 2019-02-05 DIAGNOSIS — D500 Iron deficiency anemia secondary to blood loss (chronic): Secondary | ICD-10-CM

## 2019-02-05 MED FILL — LEVOTHYROXINE 25 MCG TABLET: 25 | 90 days supply | Qty: 90 | Fill #0

## 2019-02-05 MED FILL — PANTOPRAZOLE SOD DR 20 MG T: 20 | 45 days supply | Qty: 90 | Fill #0

## 2019-02-05 NOTE — Patient Instructions (Signed)
DASH Eating Plan DASH stands for "Dietary Approaches to Stop Hypertension." The DASH eating plan is a healthy eating plan that has been shown to reduce high blood pressure (hypertension). It may also reduce your risk for type 2 diabetes, heart disease, and stroke. The DASH eating plan may also help with weight loss. What are tips for following this plan?  General guidelines  Avoid eating more than 2,300 mg (milligrams) of salt (sodium) a day. If you have hypertension, you may need to reduce your sodium intake to 1,500 mg a day.  Limit alcohol intake to no more than 1 drink a day for nonpregnant women and 2 drinks a day for men. One drink equals 12 oz of beer, 5 oz of wine, or 1 oz of hard liquor.  Work with your health care provider to maintain a healthy body weight or to lose weight. Ask what an ideal weight is for you.  Get at least 30 minutes of exercise that causes your heart to beat faster (aerobic exercise) most days of the week. Activities may include walking, swimming, or biking.  Work with your health care provider or diet and nutrition specialist (dietitian) to adjust your eating plan to your individual calorie needs. Reading food labels   Check food labels for the amount of sodium per serving. Choose foods with less than 5 percent of the Daily Value of sodium. Generally, foods with less than 300 mg of sodium per serving fit into this eating plan.  To find whole grains, look for the word "whole" as the first word in the ingredient list. Shopping  Buy products labeled as "low-sodium" or "no salt added."  Buy fresh foods. Avoid canned foods and premade or frozen meals. Cooking  Avoid adding salt when cooking. Use salt-free seasonings or herbs instead of table salt or sea salt. Check with your health care provider or pharmacist before using salt substitutes.  Do not fry foods. Cook foods using healthy methods such as baking, boiling, grilling, and broiling instead.  Cook with  heart-healthy oils, such as olive, canola, soybean, or sunflower oil. Meal planning  Eat a balanced diet that includes: ? 5 or more servings of fruits and vegetables each day. At each meal, try to fill half of your plate with fruits and vegetables. ? Up to 6-8 servings of whole grains each day. ? Less than 6 oz of lean meat, poultry, or fish each day. A 3-oz serving of meat is about the same size as a deck of cards. One egg equals 1 oz. ? 2 servings of low-fat dairy each day. ? A serving of nuts, seeds, or beans 5 times each week. ? Heart-healthy fats. Healthy fats called Omega-3 fatty acids are found in foods such as flaxseeds and coldwater fish, like sardines, salmon, and mackerel.  Limit how much you eat of the following: ? Canned or prepackaged foods. ? Food that is high in trans fat, such as fried foods. ? Food that is high in saturated fat, such as fatty meat. ? Sweets, desserts, sugary drinks, and other foods with added sugar. ? Full-fat dairy products.  Do not salt foods before eating.  Try to eat at least 2 vegetarian meals each week.  Eat more home-cooked food and less restaurant, buffet, and fast food.  When eating at a restaurant, ask that your food be prepared with less salt or no salt, if possible. What foods are recommended? The items listed may not be a complete list. Talk with your dietitian about   what dietary choices are best for you. Grains Whole-grain or whole-wheat bread. Whole-grain or whole-wheat pasta. Brown rice. Oatmeal. Quinoa. Bulgur. Whole-grain and low-sodium cereals. Pita bread. Low-fat, low-sodium crackers. Whole-wheat flour tortillas. Vegetables Fresh or frozen vegetables (raw, steamed, roasted, or grilled). Low-sodium or reduced-sodium tomato and vegetable juice. Low-sodium or reduced-sodium tomato sauce and tomato paste. Low-sodium or reduced-sodium canned vegetables. Fruits All fresh, dried, or frozen fruit. Canned fruit in natural juice (without  added sugar). Meat and other protein foods Skinless chicken or turkey. Ground chicken or turkey. Pork with fat trimmed off. Fish and seafood. Egg whites. Dried beans, peas, or lentils. Unsalted nuts, nut butters, and seeds. Unsalted canned beans. Lean cuts of beef with fat trimmed off. Low-sodium, lean deli meat. Dairy Low-fat (1%) or fat-free (skim) milk. Fat-free, low-fat, or reduced-fat cheeses. Nonfat, low-sodium ricotta or cottage cheese. Low-fat or nonfat yogurt. Low-fat, low-sodium cheese. Fats and oils Soft margarine without trans fats. Vegetable oil. Low-fat, reduced-fat, or light mayonnaise and salad dressings (reduced-sodium). Canola, safflower, olive, soybean, and sunflower oils. Avocado. Seasoning and other foods Herbs. Spices. Seasoning mixes without salt. Unsalted popcorn and pretzels. Fat-free sweets. What foods are not recommended? The items listed may not be a complete list. Talk with your dietitian about what dietary choices are best for you. Grains Baked goods made with fat, such as croissants, muffins, or some breads. Dry pasta or rice meal packs. Vegetables Creamed or fried vegetables. Vegetables in a cheese sauce. Regular canned vegetables (not low-sodium or reduced-sodium). Regular canned tomato sauce and paste (not low-sodium or reduced-sodium). Regular tomato and vegetable juice (not low-sodium or reduced-sodium). Pickles. Olives. Fruits Canned fruit in a light or heavy syrup. Fried fruit. Fruit in cream or butter sauce. Meat and other protein foods Fatty cuts of meat. Ribs. Fried meat. Bacon. Sausage. Bologna and other processed lunch meats. Salami. Fatback. Hotdogs. Bratwurst. Salted nuts and seeds. Canned beans with added salt. Canned or smoked fish. Whole eggs or egg yolks. Chicken or turkey with skin. Dairy Whole or 2% milk, cream, and half-and-half. Whole or full-fat cream cheese. Whole-fat or sweetened yogurt. Full-fat cheese. Nondairy creamers. Whipped toppings.  Processed cheese and cheese spreads. Fats and oils Butter. Stick margarine. Lard. Shortening. Ghee. Bacon fat. Tropical oils, such as coconut, palm kernel, or palm oil. Seasoning and other foods Salted popcorn and pretzels. Onion salt, garlic salt, seasoned salt, table salt, and sea salt. Worcestershire sauce. Tartar sauce. Barbecue sauce. Teriyaki sauce. Soy sauce, including reduced-sodium. Steak sauce. Canned and packaged gravies. Fish sauce. Oyster sauce. Cocktail sauce. Horseradish that you find on the shelf. Ketchup. Mustard. Meat flavorings and tenderizers. Bouillon cubes. Hot sauce and Tabasco sauce. Premade or packaged marinades. Premade or packaged taco seasonings. Relishes. Regular salad dressings. Where to find more information:  National Heart, Lung, and Blood Institute: www.nhlbi.nih.gov  American Heart Association: www.heart.org Summary  The DASH eating plan is a healthy eating plan that has been shown to reduce high blood pressure (hypertension). It may also reduce your risk for type 2 diabetes, heart disease, and stroke.  With the DASH eating plan, you should limit salt (sodium) intake to 2,300 mg a day. If you have hypertension, you may need to reduce your sodium intake to 1,500 mg a day.  When on the DASH eating plan, aim to eat more fresh fruits and vegetables, whole grains, lean proteins, low-fat dairy, and heart-healthy fats.  Work with your health care provider or diet and nutrition specialist (dietitian) to adjust your eating plan to your   individual calorie needs. This information is not intended to replace advice given to you by your health care provider. Make sure you discuss any questions you have with your health care provider. Document Released: 03/15/2011 Document Revised: 03/08/2017 Document Reviewed: 03/19/2016 Elsevier Patient Education  2020 [employer].   Hypertension, Adult High blood pressure (hypertension) is when the force of blood pumping through the  arteries is too strong. The arteries are the blood vessels that carry blood from the heart throughout the body. Hypertension forces the heart to work harder to pump blood and may cause arteries to become narrow or stiff. Untreated or uncontrolled hypertension can cause a heart attack, heart failure, a stroke, kidney disease, and other problems. A blood pressure reading consists of a higher number over a lower number. Ideally, your blood pressure should be below 120/80. The first ("top") number is called the systolic pressure. It is a measure of the pressure in your arteries as your heart beats. The second ("bottom") number is called the diastolic pressure. It is a measure of the pressure in your arteries as the heart relaxes. What are the causes? The exact cause of this condition is not known. There are some conditions that result in or are related to high blood pressure. What increases the risk? Some risk factors for high blood pressure are under your control. The following factors may make you more likely to develop this condition:  Smoking.  Having type 2 diabetes mellitus, high cholesterol, or both.  Not getting enough exercise or physical activity.  Being overweight.  Having too much fat, sugar, calories, or salt (sodium) in your diet.  Drinking too much alcohol. Some risk factors for high blood pressure may be difficult or impossible to change. Some of these factors include:  Having chronic kidney disease.  Having a family history of high blood pressure.  Age. Risk increases with age.  Race. You may be at higher risk if you are African American.  Gender. Men are at higher risk than women before age 69. After age 81, women are at higher risk than men.  Having obstructive sleep apnea.  Stress. What are the signs or symptoms? High blood pressure may not cause symptoms. Very high blood pressure (hypertensive crisis) may cause:  Headache.  Anxiety.  Shortness of breath.   Nosebleed.  Nausea and vomiting.  Vision changes.  Severe chest pain.  Seizures. How is this diagnosed? This condition is diagnosed by measuring your blood pressure while you are seated, with your arm resting on a flat surface, your legs uncrossed, and your feet flat on the floor. The cuff of the blood pressure monitor will be placed directly against the skin of your upper arm at the level of your heart. It should be measured at least twice using the same arm. Certain conditions can cause a difference in blood pressure between your right and left arms. Certain factors can cause blood pressure readings to be lower or higher than normal for a short period of time:  When your blood pressure is higher when you are in a health care provider's office than when you are at home, this is called white coat hypertension. Most people with this condition do not need medicines.  When your blood pressure is higher at home than when you are in a health care provider's office, this is called masked hypertension. Most people with this condition may need medicines to control blood pressure. If you have a high blood pressure reading during one visit or  you have normal blood pressure with other risk factors, you may be asked to:  Return on a different day to have your blood pressure checked again.  Monitor your blood pressure at home for 1 week or longer. If you are diagnosed with hypertension, you may have other blood or imaging tests to help your health care provider understand your overall risk for other conditions. How is this treated? This condition is treated by making healthy lifestyle changes, such as eating healthy foods, exercising more, and reducing your alcohol intake. Your health care provider may prescribe medicine if lifestyle changes are not enough to get your blood pressure under control, and if:  Your systolic blood pressure is above 130.  Your diastolic blood pressure is above 80. Your  personal target blood pressure may vary depending on your medical conditions, your age, and other factors. Follow these instructions at home: Eating and drinking   Eat a diet that is high in fiber and potassium, and low in sodium, added sugar, and fat. An example eating plan is called the DASH (Dietary Approaches to Stop Hypertension) diet. To eat this way: ? Eat plenty of fresh fruits and vegetables. Try to fill one half of your plate at each meal with fruits and vegetables. ? Eat whole grains, such as whole-wheat pasta, brown rice, or whole-grain bread. Fill about one fourth of your plate with whole grains. ? Eat or drink low-fat dairy products, such as skim milk or low-fat yogurt. ? Avoid fatty cuts of meat, processed or cured meats, and poultry with skin. Fill about one fourth of your plate with lean proteins, such as fish, chicken without skin, beans, eggs, or tofu. ? Avoid pre-made and processed foods. These tend to be higher in sodium, added sugar, and fat.  Reduce your daily sodium intake. Most people with hypertension should eat less than 1,500 mg of sodium a day.  Do not drink alcohol if: ? Your health care provider tells you not to drink. ? You are pregnant, may be pregnant, or are planning to become pregnant.  If you drink alcohol: ? Limit how much you use to:  0-1 drink a day for women.  0-2 drinks a day for men. ? Be aware of how much alcohol is in your drink. In the [place] one drink equals one 12 oz bottle of beer (355 mL), one 5 oz glass of wine (148 mL), or one 1 oz glass of hard liquor (44 mL). Lifestyle   Work with your health care provider to maintain a healthy body weight or to lose weight. Ask what an ideal weight is for you.  Get at least 30 minutes of exercise most days of the week. Activities may include walking, swimming, or biking.  Include exercise to strengthen your muscles (resistance exercise), such as Pilates or lifting weights, as part of your weekly  exercise routine. Try to do these types of exercises for 30 minutes at least 3 days a week.  Do not use any products that contain nicotine or tobacco, such as cigarettes, e-cigarettes, and chewing tobacco. If you need help quitting, ask your health care provider.  Monitor your blood pressure at home as told by your health care provider.  Keep all follow-up visits as told by your health care provider. This is important. Medicines  Take over-the-counter and prescription medicines only as told by your health care provider. Follow directions carefully. Blood pressure medicines must be taken as prescribed.  Do not skip doses of blood pressure medicine.  Doing this puts you at risk for problems and can make the medicine less effective.  Ask your health care provider about side effects or reactions to medicines that you should watch for. Contact a health care provider if you:  Think you are having a reaction to a medicine you are taking.  Have headaches that keep coming back (recurring).  Feel dizzy.  Have swelling in your ankles.  Have trouble with your vision. Get help right away if you:  Develop a severe headache or confusion.  Have unusual weakness or numbness.  Feel faint.  Have severe pain in your chest or abdomen.  Vomit repeatedly.  Have trouble breathing. Summary  Hypertension is when the force of blood pumping through your arteries is too strong. If this condition is not controlled, it may put you at risk for serious complications.  Your personal target blood pressure may vary depending on your medical conditions, your age, and other factors. For most people, a normal blood pressure is less than 120/80.  Hypertension is treated with lifestyle changes, medicines, or a combination of both. Lifestyle changes include losing weight, eating a healthy, low-sodium diet, exercising more, and limiting alcohol. This information is not intended to replace advice given to you by  your health care provider. Make sure you discuss any questions you have with your health care provider. Document Released: 03/26/2005 Document Revised: 12/04/2017 Document Reviewed: 12/04/2017 Elsevier Patient Education  2020 [employer].
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
## 2019-02-06 LAB — FACTOR 5 LEIDEN

## 2019-02-06 LAB — T4, FREE: Free T4: 0.83 ng/dL (ref 0.60–1.60)

## 2019-02-06 LAB — VITAMIN B12: Vitamin B-12: 725 pg/mL (ref 211–911)

## 2019-02-06 LAB — TSH: TSH: 1.64 u[IU]/mL (ref 0.35–4.50)

## 2019-02-09 DIAGNOSIS — N751 Abscess of Bartholin's gland: Secondary | ICD-10-CM | POA: Diagnosis not present

## 2019-02-09 LAB — PROTHROMBIN GENE MUTATION

## 2019-02-09 MED FILL — DOXYCYCLINE HYC 100 MG CAPS: 100 | 7 days supply | Qty: 14 | Fill #0

## 2019-02-11 ENCOUNTER — Telehealth: Payer: Self-pay

## 2019-02-11 NOTE — Telephone Encounter (Signed)
-----   Message from Truitt Merle, MD sent at 02/11/2019 10:16 AM EST ----- Please let pt know her lab results from last week, her genetic mutation tests for thrombophilia were all negative, iron level was good, no concerns, thanks   Truitt Merle  02/11/2019

## 2019-02-11 NOTE — Telephone Encounter (Signed)
Spoke with patient regarding lab results.  Per Dr. Burr Medico informed her genetic mutation tests for thrombophilia were all negative, iron level was good, no concerns.  Patient verbalized an understanding.

## 2019-02-24 MED FILL — ELIQUIS 5 MG TABLET: 5 | 30 days supply | Qty: 60 | Fill #1

## 2019-03-11 ENCOUNTER — Inpatient Hospital Stay: Payer: 59 | Attending: Internal Medicine

## 2019-03-11 ENCOUNTER — Inpatient Hospital Stay: Payer: 59

## 2019-03-11 ENCOUNTER — Other Ambulatory Visit: Payer: Self-pay

## 2019-03-11 VITALS — BP 138/72 | HR 72 | Temp 98.2°F | Resp 18

## 2019-03-11 DIAGNOSIS — N751 Abscess of Bartholin's gland: Secondary | ICD-10-CM | POA: Diagnosis not present

## 2019-03-11 DIAGNOSIS — E538 Deficiency of other specified B group vitamins: Secondary | ICD-10-CM | POA: Diagnosis not present

## 2019-03-11 DIAGNOSIS — X32XXXA Exposure to sunlight, initial encounter: Secondary | ICD-10-CM | POA: Diagnosis not present

## 2019-03-11 DIAGNOSIS — D500 Iron deficiency anemia secondary to blood loss (chronic): Secondary | ICD-10-CM | POA: Insufficient documentation

## 2019-03-11 DIAGNOSIS — L570 Actinic keratosis: Secondary | ICD-10-CM | POA: Diagnosis not present

## 2019-03-11 DIAGNOSIS — N924 Excessive bleeding in the premenopausal period: Secondary | ICD-10-CM | POA: Insufficient documentation

## 2019-03-11 DIAGNOSIS — Z08 Encounter for follow-up examination after completed treatment for malignant neoplasm: Secondary | ICD-10-CM | POA: Diagnosis not present

## 2019-03-11 DIAGNOSIS — Z85820 Personal history of malignant melanoma of skin: Secondary | ICD-10-CM | POA: Diagnosis not present

## 2019-03-11 DIAGNOSIS — L821 Other seborrheic keratosis: Secondary | ICD-10-CM | POA: Diagnosis not present

## 2019-03-11 DIAGNOSIS — Z1283 Encounter for screening for malignant neoplasm of skin: Secondary | ICD-10-CM | POA: Diagnosis not present

## 2019-03-11 LAB — IRON AND TIBC
Iron: 23 ug/dL — ABNORMAL LOW (ref 41–142)
Saturation Ratios: 7 % — ABNORMAL LOW (ref 21–57)
TIBC: 330 ug/dL (ref 236–444)
UIBC: 307 ug/dL (ref 120–384)

## 2019-03-11 LAB — CBC WITH DIFFERENTIAL (CANCER CENTER ONLY)
Abs Immature Granulocytes: 0.02 10*3/uL (ref 0.00–0.07)
Basophils Absolute: 0 10*3/uL (ref 0.0–0.1)
Basophils Relative: 0 %
Eosinophils Absolute: 0.1 10*3/uL (ref 0.0–0.5)
Eosinophils Relative: 1 %
HCT: 29.2 % — ABNORMAL LOW (ref 36.0–46.0)
Hemoglobin: 9.1 g/dL — ABNORMAL LOW (ref 12.0–15.0)
Immature Granulocytes: 0 %
Lymphocytes Relative: 30 %
Lymphs Abs: 1.6 10*3/uL (ref 0.7–4.0)
MCH: 28 pg (ref 26.0–34.0)
MCHC: 31.2 g/dL (ref 30.0–36.0)
MCV: 89.8 fL (ref 80.0–100.0)
Monocytes Absolute: 0.4 10*3/uL (ref 0.1–1.0)
Monocytes Relative: 8 %
Neutro Abs: 3.2 10*3/uL (ref 1.7–7.7)
Neutrophils Relative %: 61 %
Platelet Count: 273 10*3/uL (ref 150–400)
RBC: 3.25 MIL/uL — ABNORMAL LOW (ref 3.87–5.11)
RDW: 14.3 % (ref 11.5–15.5)
WBC Count: 5.4 10*3/uL (ref 4.0–10.5)
nRBC: 0 % (ref 0.0–0.2)

## 2019-03-11 LAB — RETIC PANEL
Immature Retic Fract: 22.3 % — ABNORMAL HIGH (ref 2.3–15.9)
RBC.: 3.2 MIL/uL — ABNORMAL LOW (ref 3.87–5.11)
Retic Count, Absolute: 93 10*3/uL (ref 19.0–186.0)
Retic Ct Pct: 2.9 % (ref 0.4–3.1)
Reticulocyte Hemoglobin: 26.1 pg — ABNORMAL LOW (ref >27.9)

## 2019-03-11 LAB — FERRITIN: Ferritin: 5 ng/mL — ABNORMAL LOW (ref 11–307)

## 2019-03-11 MED ORDER — CYANOCOBALAMIN 1000 MCG/ML IJ SOLN
1000.0000 ug | Freq: Once | INTRAMUSCULAR | Status: AC
  Administered 2019-03-11: 1000 ug via INTRAMUSCULAR

## 2019-03-11 MED ORDER — CYANOCOBALAMIN 1000 MCG/ML IJ SOLN
INTRAMUSCULAR | Status: AC
  Filled 2019-03-11: qty 1

## 2019-03-16 ENCOUNTER — Telehealth: Payer: Self-pay

## 2019-03-16 ENCOUNTER — Telehealth: Payer: Self-pay | Admitting: Hematology

## 2019-03-16 NOTE — Telephone Encounter (Signed)
Spoke with patient to inform of lab results and that anemia is worse.  Let patient know that Dr. Burr Medico wants her to receive Feraheme x 2 and that scheduling will reach out to her to make appointments.

## 2019-03-16 NOTE — Telephone Encounter (Signed)
-----   Message from Jonnie Finner, RN sent at 03/16/2019  8:55 AM EST -----  ----- Message ----- From: Truitt Merle, MD Sent: 03/14/2019   3:28 PM EST To: [facility]  Please let pt know her iron level is very low, and she is more anemia, I will send schedule message for iv feraheme X2, thanks   Truitt Merle  03/14/2019

## 2019-03-16 NOTE — Telephone Encounter (Signed)
Scheduled appt per 12/5 sch message - unable to reach pt - left message with appt date and time .

## 2019-03-23 ENCOUNTER — Other Ambulatory Visit: Payer: Self-pay

## 2019-03-23 ENCOUNTER — Inpatient Hospital Stay: Payer: 59

## 2019-03-23 ENCOUNTER — Other Ambulatory Visit: Payer: Self-pay | Admitting: Hematology

## 2019-03-23 ENCOUNTER — Telehealth: Payer: Self-pay

## 2019-03-23 VITALS — BP 116/54 | HR 79 | Temp 97.6°F | Resp 16

## 2019-03-23 DIAGNOSIS — E538 Deficiency of other specified B group vitamins: Secondary | ICD-10-CM | POA: Diagnosis not present

## 2019-03-23 DIAGNOSIS — D500 Iron deficiency anemia secondary to blood loss (chronic): Secondary | ICD-10-CM

## 2019-03-23 DIAGNOSIS — N924 Excessive bleeding in the premenopausal period: Secondary | ICD-10-CM | POA: Diagnosis not present

## 2019-03-23 MED ORDER — SODIUM CHLORIDE 0.9 % IV SOLN
Freq: Once | INTRAVENOUS | Status: AC
  Administered 2019-03-23: 13:00:00 via INTRAVENOUS
  Filled 2019-03-23: qty 250

## 2019-03-23 MED ORDER — SODIUM CHLORIDE 0.9% FLUSH
10.0000 mL | Freq: Once | INTRAVENOUS | Status: DC | PRN
  Filled 2019-03-23: qty 10

## 2019-03-23 MED ORDER — SODIUM CHLORIDE 0.9 % IV SOLN
510.0000 mg | Freq: Once | INTRAVENOUS | Status: AC
  Administered 2019-03-23: 14:00:00 510 mg via INTRAVENOUS
  Filled 2019-03-23: qty 510

## 2019-03-23 MED ORDER — HEPARIN SOD (PORK) LOCK FLUSH 100 UNIT/ML IV SOLN
500.0000 [IU] | Freq: Once | INTRAVENOUS | Status: DC | PRN
  Filled 2019-03-23: qty 5

## 2019-03-23 NOTE — Patient Instructions (Signed)
Ferumoxytol injection What is this medicine? FERUMOXYTOL is an iron complex. Iron is used to make healthy red blood cells, which carry oxygen and nutrients throughout the body. This medicine is used to treat iron deficiency anemia. This medicine may be used for other purposes; ask your health care provider or pharmacist if you have questions. COMMON BRAND NAME(S): Feraheme What should I tell my health care provider before I take this medicine? They need to know if you have any of these conditions:  anemia not caused by low iron levels  high levels of iron in the blood  magnetic resonance imaging (MRI) test scheduled  an unusual or allergic reaction to iron, other medicines, foods, dyes, or preservatives  pregnant or trying to get pregnant  breast-feeding How should I use this medicine? This medicine is for injection into a vein. It is given by a health care professional in a hospital or clinic setting. Talk to your pediatrician regarding the use of this medicine in children. Special care may be needed. Overdosage: If you think you have taken too much of this medicine contact a poison control center or emergency room at once. NOTE: This medicine is only for you. Do not share this medicine with others. What if I miss a dose? It is important not to miss your dose. Call your doctor or health care professional if you are unable to keep an appointment. What may interact with this medicine? This medicine may interact with the following medications:  other iron products This list may not describe all possible interactions. Give your health care provider a list of all the medicines, herbs, non-prescription drugs, or dietary supplements you use. Also tell them if you smoke, drink alcohol, or use illegal drugs. Some items may interact with your medicine. What should I watch for while using this medicine? Visit your doctor or healthcare professional regularly. Tell your doctor or healthcare  professional if your symptoms do not start to get better or if they get worse. You may need blood work done while you are taking this medicine. You may need to follow a special diet. Talk to your doctor. Foods that contain iron include: whole grains/cereals, dried fruits, beans, or peas, leafy green vegetables, and organ meats (liver, kidney). What side effects may I notice from receiving this medicine? Side effects that you should report to your doctor or health care professional as soon as possible:  allergic reactions like skin rash, itching or hives, swelling of the face, lips, or tongue  breathing problems  changes in blood pressure  feeling faint or lightheaded, falls  fever or chills  flushing, sweating, or hot feelings  swelling of the ankles or feet Side effects that usually do not require medical attention (report to your doctor or health care professional if they continue or are bothersome):  diarrhea  headache  nausea, vomiting  stomach pain This list may not describe all possible side effects. Call your doctor for medical advice about side effects. You may report side effects to FDA at 1-800-FDA-1088. Where should I keep my medicine? This drug is given in a hospital or clinic and will not be stored at home. NOTE: This sheet is a summary. It may not cover all possible information. If you have questions about this medicine, talk to your doctor, pharmacist, or health care provider.  2020 Elsevier/Gold Standard (2016-05-14 20:21:10) Coronavirus (COVID-19) Are you at risk?  Are you at risk for the Coronavirus (COVID-19)?  To be considered HIGH RISK for Coronavirus (COVID-19),   you have to meet the following criteria:  . Traveled to [place], [place], [place], [place] or [place]; or in the [place] to [place], [place], [place], or [place]; and have fever, cough, and shortness of breath within the last 2 weeks of travel OR . Been in close contact with a person  diagnosed with COVID-19 within the last 2 weeks and have fever, cough, and shortness of breath . IF YOU DO NOT MEET THESE CRITERIA, YOU ARE CONSIDERED LOW RISK FOR COVID-19.  What to do if you are HIGH RISK for COVID-19?  . If you are having a medical emergency, call 911. . Seek medical care right away. Before you go to a doctor's office, urgent care or emergency department, call ahead and tell them about your recent travel, contact with someone diagnosed with COVID-19, and your symptoms. You should receive instructions from your physician's office regarding next steps of care.  . When you arrive at healthcare provider, tell the healthcare staff immediately you have returned from visiting [place], [place], [place], [place] or [place]; or traveled in the [place] to [place], [place], [place], or [place]; in the last two weeks or you have been in close contact with a person diagnosed with COVID-19 in the last 2 weeks.   . Tell the health care staff about your symptoms: fever, cough and shortness of breath. . After you have been seen by a medical provider, you will be either: o Tested for (COVID-19) and discharged home on quarantine except to seek medical care if symptoms worsen, and asked to  - Stay home and avoid contact with others until you get your results (4-5 days)  - Avoid travel on public transportation if possible (such as bus, train, or airplane) or o Sent to the Emergency Department by EMS for evaluation, COVID-19 testing, and possible admission depending on your condition and test results.  What to do if you are LOW RISK for COVID-19?  Reduce your risk of any infection by using the same precautions used for avoiding the common cold or flu:  . Wash your hands often with soap and warm water for at least 20 seconds.  If soap and water are not readily available, use an alcohol-based hand sanitizer with at least 60% alcohol.  . If coughing or sneezing, cover your mouth and nose by  coughing or sneezing into the elbow areas of your shirt or coat, into a tissue or into your sleeve (not your hands). . Avoid shaking hands with others and consider head nods or verbal greetings only. . Avoid touching your eyes, nose, or mouth with unwashed hands.  . Avoid close contact with people who are sick. . Avoid places or events with large numbers of people in one location, like concerts or sporting events. . Carefully consider travel plans you have or are making. . If you are planning any travel outside or inside the [place], visit the CDC's Travelers' Health webpage for the latest health notices. . If you have some symptoms but not all symptoms, continue to monitor at home and seek medical attention if your symptoms worsen. . If you are having a medical emergency, call 911.   ADDITIONAL HEALTHCARE OPTIONS FOR PATIENTS  [facility] / e-Visit: [email or web address]         [facility]: [phone]  [facility]: [phone]                   [facility]: [phone]   

## 2019-03-23 NOTE — Progress Notes (Signed)
Okay to proceed with Feraheme today per Darlena from an insurance standpoint.   Demetrius Charity, PharmD, [facility] Pharmac[facility] Phone: [phone] 03/23/2019

## 2019-03-23 NOTE — Telephone Encounter (Signed)
Patient called this morning and asked for someone to call her back.  She feels very bad and wants to get her iron infusion today if at all possible.  Advised her she could come in at 63 and she said this would work for her.  Rhonda Ford

## 2019-03-26 ENCOUNTER — Telehealth: Payer: Self-pay

## 2019-03-26 NOTE — Telephone Encounter (Signed)
Faxed signed order reflecting NTI medication change to [place], sent to [facility] for scan to chart.

## 2019-03-27 ENCOUNTER — Inpatient Hospital Stay: Payer: 59

## 2019-03-30 ENCOUNTER — Inpatient Hospital Stay: Payer: 59

## 2019-03-30 ENCOUNTER — Other Ambulatory Visit: Payer: Self-pay

## 2019-03-30 VITALS — BP 128/73 | HR 78 | Temp 98.2°F | Resp 16

## 2019-03-30 DIAGNOSIS — N924 Excessive bleeding in the premenopausal period: Secondary | ICD-10-CM | POA: Diagnosis not present

## 2019-03-30 DIAGNOSIS — E538 Deficiency of other specified B group vitamins: Secondary | ICD-10-CM | POA: Diagnosis not present

## 2019-03-30 DIAGNOSIS — D500 Iron deficiency anemia secondary to blood loss (chronic): Secondary | ICD-10-CM

## 2019-03-30 MED ORDER — SODIUM CHLORIDE 0.9 % IV SOLN
510.0000 mg | Freq: Once | INTRAVENOUS | Status: AC
  Administered 2019-03-30: 510 mg via INTRAVENOUS
  Filled 2019-03-30: qty 510

## 2019-03-30 MED ORDER — SODIUM CHLORIDE 0.9 % IV SOLN
INTRAVENOUS | Status: DC
  Filled 2019-03-30: qty 250

## 2019-03-30 MED ORDER — CYANOCOBALAMIN 1000 MCG/ML IJ SOLN
1000.0000 ug | Freq: Once | INTRAMUSCULAR | Status: AC
  Administered 2019-03-30: 1000 ug via INTRAMUSCULAR

## 2019-03-30 MED ORDER — CYANOCOBALAMIN 1000 MCG/ML IJ SOLN
INTRAMUSCULAR | Status: AC
  Filled 2019-03-30: qty 1

## 2019-03-30 NOTE — Patient Instructions (Signed)
Ferumoxytol injection What is this medicine? FERUMOXYTOL is an iron complex. Iron is used to make healthy red blood cells, which carry oxygen and nutrients throughout the body. This medicine is used to treat iron deficiency anemia. This medicine may be used for other purposes; ask your health care provider or pharmacist if you have questions. COMMON BRAND NAME(S): Feraheme What should I tell my health care provider before I take this medicine? They need to know if you have any of these conditions:  anemia not caused by low iron levels  high levels of iron in the blood  magnetic resonance imaging (MRI) test scheduled  an unusual or allergic reaction to iron, other medicines, foods, dyes, or preservatives  pregnant or trying to get pregnant  breast-feeding How should I use this medicine? This medicine is for injection into a vein. It is given by a health care professional in a hospital or clinic setting. Talk to your pediatrician regarding the use of this medicine in children. Special care may be needed. Overdosage: If you think you have taken too much of this medicine contact a poison control center or emergency room at once. NOTE: This medicine is only for you. Do not share this medicine with others. What if I miss a dose? It is important not to miss your dose. Call your doctor or health care professional if you are unable to keep an appointment. What may interact with this medicine? This medicine may interact with the following medications:  other iron products This list may not describe all possible interactions. Give your health care provider a list of all the medicines, herbs, non-prescription drugs, or dietary supplements you use. Also tell them if you smoke, drink alcohol, or use illegal drugs. Some items may interact with your medicine. What should I watch for while using this medicine? Visit your doctor or healthcare professional regularly. Tell your doctor or healthcare  professional if your symptoms do not start to get better or if they get worse. You may need blood work done while you are taking this medicine. You may need to follow a special diet. Talk to your doctor. Foods that contain iron include: whole grains/cereals, dried fruits, beans, or peas, leafy green vegetables, and organ meats (liver, kidney). What side effects may I notice from receiving this medicine? Side effects that you should report to your doctor or health care professional as soon as possible:  allergic reactions like skin rash, itching or hives, swelling of the face, lips, or tongue  breathing problems  changes in blood pressure  feeling faint or lightheaded, falls  fever or chills  flushing, sweating, or hot feelings  swelling of the ankles or feet Side effects that usually do not require medical attention (report to your doctor or health care professional if they continue or are bothersome):  diarrhea  headache  nausea, vomiting  stomach pain This list may not describe all possible side effects. Call your doctor for medical advice about side effects. You may report side effects to FDA at 1-800-FDA-1088. Where should I keep my medicine? This drug is given in a hospital or clinic and will not be stored at home. NOTE: This sheet is a summary. It may not cover all possible information. If you have questions about this medicine, talk to your doctor, pharmacist, or health care provider.  2020 Elsevier/Gold Standard (2016-05-14 20:21:10)  Cyanocobalamin, Pyridoxine, and Folate What is this medicine? A multivitamin containing folic acid, vitamin B6, and vitamin B12. This medicine may be used  for other purposes; ask your health care provider or pharmacist if you have questions. COMMON BRAND NAME(S): AllanFol RX, AllanTex, Av-Vite FB, B Complex with Folic Acid, ComBgen, FaBB, Folamin, Folastin, [place], [place], [place], [place], [place], [employer], [place] RX 2.2, [place],  [place] 2.2, Foltabs 800, Foltx, Homocysteine Formula, Niva-Fol, NuFol, [employer], Virt-Gard, Virt-Vite, Virt-Vite [place], Vita-Respa What should I tell my health care provider before I take this medicine? They need to know if you have any of these conditions:  bleeding or clotting disorder  history of anemia of any type  other chronic health condition  an unusual or allergic reaction to vitamins, other medicines, foods, dyes, or preservatives  pregnant or trying to get pregnant  breast-feeding How should I use this medicine? Take by mouth with a glass of water. May take with food. Follow the directions on the prescription label. It is usually given once a day. Do not take your medicine more often than directed. Contact your pediatrician regarding the use of this medicine in children. Special care may be needed. Overdosage: If you think you have taken too much of this medicine contact a poison control center or emergency room at once. NOTE: This medicine is only for you. Do not share this medicine with others. What if I miss a dose? If you miss a dose, take it as soon as you can. If it is almost time for your next dose, take only that dose. Do not take double or extra doses. What may interact with this medicine?  levodopa This list may not describe all possible interactions. Give your health care provider a list of all the medicines, herbs, non-prescription drugs, or dietary supplements you use. Also tell them if you smoke, drink alcohol, or use illegal drugs. Some items may interact with your medicine. What should I watch for while using this medicine? See your health care professional for regular checks on your progress. Remember that vitamin supplements do not replace the need for good nutrition from a balanced diet. What side effects may I notice from receiving this medicine? Side effects that you should report to your doctor or health care professional as soon as  possible:  allergic reaction such as skin rash or difficulty breathing  vomiting Side effects that usually do not require medical attention (report to your doctor or health care professional if they continue or are bothersome):  nausea  stomach upset This list may not describe all possible side effects. Call your doctor for medical advice about side effects. You may report side effects to FDA at 1-800-FDA-1088. Where should I keep my medicine? Keep out of the reach of children. Most vitamins should be stored at controlled room temperature. Check your specific product directions. Protect from heat and moisture. Throw away any unused medicine after the expiration date. NOTE: This sheet is a summary. It may not cover all possible information. If you have questions about this medicine, talk to your doctor, pharmacist, or health care provider.  2020 Elsevier/Gold Standard (2007-05-17 00:59:55)  Coronavirus (COVID-19) Are you at risk?  Are you at risk for the Coronavirus (COVID-19)?  To be considered HIGH RISK for Coronavirus (COVID-19), you have to meet the following criteria:  . Traveled to [place], [place], [place], [place] or [place]; or in the [place] to [place], [place], [place], or [place]; and have fever, cough, and shortness of breath within the last 2 weeks of travel OR . Been in close contact with a person diagnosed with COVID-19  within the last 2 weeks and have fever, cough, and shortness of breath . IF YOU DO NOT MEET THESE CRITERIA, YOU ARE CONSIDERED LOW RISK FOR COVID-19.  What to do if you are HIGH RISK for COVID-19?  [name] If you are having a medical emergency, call 911. . Seek medical care right away. Before you go to a doctor's office, urgent care or emergency department, call ahead and tell them about your recent travel, contact with someone diagnosed with COVID-19, and your symptoms. You should receive instructions from your physician's office regarding next  steps of care.  . When you arrive at healthcare provider, tell the healthcare staff immediately you have returned from visiting [place], [place], [place], [place] or [place]; or traveled in the [place] to [place], [place], [place], or [place]; in the last two weeks or you have been in close contact with a person diagnosed with COVID-19 in the last 2 weeks.   . Tell the health care staff about your symptoms: fever, cough and shortness of breath. . After you have been seen by a medical provider, you will be either: o Tested for (COVID-19) and discharged home on quarantine except to seek medical care if symptoms worsen, and asked to  - Stay home and avoid contact with others until you get your results (4-5 days)  - Avoid travel on public transportation if possible (such as bus, train, or airplane) or o Sent to the Emergency Department by EMS for evaluation, COVID-19 testing, and possible admission depending on your condition and test results.  What to do if you are LOW RISK for COVID-19?  Reduce your risk of any infection by using the same precautions used for avoiding the common cold or flu:  [name] Wash your hands often with soap and warm water for at least 20 seconds.  If soap and water are not readily available, use an alcohol-based hand sanitizer with at least 60% alcohol.  . If coughing or sneezing, cover your mouth and nose by coughing or sneezing into the elbow areas of your shirt or coat, into a tissue or into your sleeve (not your hands). . Avoid shaking hands with others and consider head nods or verbal greetings only. . Avoid touching your eyes, nose, or mouth with unwashed hands.  . Avoid close contact with people who are sick. . Avoid places or events with large numbers of people in one location, like concerts or sporting events. . Carefully consider travel plans you have or are making. . If you are planning any travel outside or inside the [place], visit the CDC's Travelers' Health  webpage for the latest health notices. . If you have some symptoms but not all symptoms, continue to monitor at home and seek medical attention if your symptoms worsen. . If you are having a medical emergency, call 911.   Lookout / e-Visit: [email or web address]         [facility]: [facility]: [id number]                   [facility]: [phone]

## 2019-04-06 ENCOUNTER — Ambulatory Visit: Payer: 59

## 2019-04-07 ENCOUNTER — Other Ambulatory Visit: Payer: Self-pay | Admitting: Hematology

## 2019-04-07 DIAGNOSIS — E039 Hypothyroidism, unspecified: Secondary | ICD-10-CM

## 2019-04-07 MED FILL — ELIQUIS 5 MG TABLET: 5 | 30 days supply | Qty: 60 | Fill #2

## 2019-04-08 NOTE — Progress Notes (Signed)
[facility]   [phone] [phone]   Clinic Follow up Note   Patient Care Team: Rhonda Nian, DO as PCP - General (Family Medicine)  Date of Service:  04/13/2019  CHIEF COMPLAINT: F/u of PE and iron deficient anemia   CURRENT THERAPY:  -IV iron as needed since 06/2018.  -oral iron and oral B12 -monthly B12 injection since 06/25/18  INTERVAL HISTORY:  Rhonda Ford is here for a follow up PE and IDA. She was last seen by me 4 months ago. She presents to the clinic alone. She notes she is doing well. She notes her energy is lower with SOB. She notes currently menstruating with menorrhagia. This usually lasts 5-6 days. She notes her recent labs show 8.8. She also notes she stopped Protonix which effects her iron absorption.     REVIEW OF SYSTEMS:   Constitutional: Denies fevers, chills or abnormal weight loss (+) Fatigue  Eyes: Denies blurriness of vision Ears, nose, mouth, throat, and face: Denies mucositis or sore throat Respiratory: Denies cough or wheezes (+) Mild SOB  Cardiovascular: Denies palpitation, chest discomfort or lower extremity swelling Gastrointestinal:  Denies nausea, heartburn or change in bowel habits Skin: Denies abnormal skin rashes Lymphatics: Denies new lymphadenopathy or easy bruising Neurological:Denies numbness, tingling or new weaknesses Behavioral/Psych: Mood is stable, no new changes  All other systems were reviewed with the patient and are negative.  MEDICAL HISTORY:  Past Medical History:  Diagnosis Date  . B12 deficiency   . Iron deficiency anemia due to chronic blood loss   . Melanoma ([place])   . Migraines   . Pulmonary emboli ([place]) 01/2018  . Skin cancer     SURGICAL HISTORY: Past Surgical History:  Procedure Laterality Date  . gyn surgeries    . melanoma surgery      leg    I have reviewed the social history and family history with the patient and they are unchanged from previous  note.  ALLERGIES:  has No Known Allergies.  MEDICATIONS:  Current Outpatient Medications  Medication Sig Dispense Refill  . apixaban (ELIQUIS) 5 MG TABS tablet Take 1 tablet (5 mg total) by mouth 2 (two) times daily. 60 tablet 4  . Ferrous Sulfate (SLOW FE PO) Take by mouth.    . levothyroxine (SYNTHROID) 25 MCG tablet Take 1 tablet (25 mcg total) by mouth daily before breakfast. 30 tablet 2   No current facility-administered medications for this visit.   Facility-Administered Medications Ordered in Other Visits  Medication Dose Route Frequency Provider Last Rate Last Admin  . cyanocobalamin ((VITAMIN B-12)) injection 1,000 mcg  1,000 mcg Intramuscular Once Rhonda Ford., PA-C        PHYSICAL EXAMINATION: ECOG PERFORMANCE STATUS: 1 - Symptomatic but completely ambulatory  Vitals:   04/13/19 0913  BP: (!) 142/87  Pulse: 78  Resp: 18  Temp: 98.2 F (36.8 C)  SpO2: 100%   Filed Weights   04/13/19 0913  Weight: 195 lb 3.2 oz (88.5 kg)    Due to COVID19 we will limit examination to appearance. Patient had no complaints.  GENERAL:alert, no distress and comfortable SKIN: skin color normal, no rashes or significant lesions EYES: normal, Conjunctiva are pink and non-injected, sclera clear  NEURO: alert & oriented x 3 with fluent speech   LABORATORY DATA:  I have reviewed the data as listed CBC Latest Ref Rng & Units 04/13/2019 03/11/2019 02/03/2019  WBC 4.0 - 10.5 K/uL 3.7(L) 5.4 6.7  Hemoglobin  12.0 - 15.0 g/dL 9.4(L) 9.1(L) 11.8(L)  Hematocrit 36.0 - 46.0 % 30.5(L) 29.2(L) 35.6(L)  Platelets 150 - 400 K/uL 282 273 290     CMP Latest Ref Rng & Units 11/21/2018 08/08/2018 06/19/2018  Glucose 70 - 99 mg/dL 99 99 89  BUN 6 - 20 mg/dL 9 18 20   Creatinine 0.44 - 1.00 mg/dL 0.67 0.72 0.83  Sodium 135 - 145 mmol/L 140 139 139  Potassium 3.5 - 5.1 mmol/L 3.7 3.8 4.2  Chloride 98 - 111 mmol/L 107 107 107  CO2 22 - 32 mmol/L 24 24 22   Calcium 8.9 - 10.3 mg/dL 8.5(L) 8.5(L) 8.8(L)   Total Protein 6.5 - 8.1 g/dL 6.1(L) 6.8 7.4  Total Bilirubin 0.3 - 1.2 mg/dL 0.2(L) 0.3 0.5  Alkaline Phos 38 - 126 U/L 69 75 82  AST 15 - 41 U/L 14(L) 11(L) 15  ALT 0 - 44 U/L 15 15 14       RADIOGRAPHIC STUDIES: I have personally reviewed the radiological images as listed and agreed with the findings in the report. No results found.   ASSESSMENT & PLAN:  Rhonda Ford is a 48 y.o. female with    1. Iron deficiency anemia, B12 deficiency -Likely secondary to menorrhagia. Unclear the etiology about her B12 deficiency  -Per patient she has been anemic ranging from 7.5-10 since she was an adolescent with chronic fatigue. This had not been worked up until her PE in late 2019.  -Workup done by Rhonda Ford in 06/2018 is consistent with iron deficiency anemia.  -Her 01/2018 colonoscopy and EGD which was normal except stomach ulcer, no H. Pylorie. She has recently stopped PPI -She has had long history of menorrhagia which is the likely cause of her anemia. Since 04/2018 her periods have been irregular and occasionally lighter. She feels she may be perimenopausal.  -She is currently on IV Feraheme as needed since 06/2018. Last dose given in 03/2019.  -She also has B12 deficiency based on lab in early 2020. She has had no gastric or GI surgeries. She is currently on monthly B12 injections since early 2020 and oral B12. -She is currently menstruating with menorrhagia. Her Hg at 9.4. She is symptomatic with mild SOB and fatigue. Based on Iron level will give IV iron.  -Continue B12 injection monthly. I did offer home injections instead, she rather come to clinic.  -Continue IV Feraheme as needed. Goal is to keep ferritin above 100  -F/u in 5 months  2. H/o Pulmonary Emboli, Unprovoked  -Occurred in 01/2018. No PE seen on 07/03/18 Angio CT chest.  -She has been treated with Eliquis since 06/2018. Given this was significant, unprovoked PE and no family history of PE, it has been recommended to  continue indefinitely.  -She previously had another bout of menorrhagia but did not stop Eliquis given her concern for another PE.  -Given anemia from menorrhagia, I recommend she hold Eliquis for a few days during her period. She is agreeable.    3. 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 She is followed by dermatology who she saw when she lived in [place].    PLAN:  -next B12 injection on 12/21, continue monthly  -Continue oral B12 and oral iron  -Lab monthly X5 -iv iron if ferritin<100 -F/u in 5 months    No problem-specific Assessment & Plan notes found for this encounter.   No orders of the defined types were placed in this encounter.  All questions were answered. The patient knows to call the clinic with any problems, questions or concerns. No barriers to learning was detected. The total time spent in the appointment was 15 minutes and more than 50% was on counseling and review of test results     Truitt Merle, MD 04/13/2019   I, Joslyn Devon, am acting as scribe for Truitt Merle, MD.   I have reviewed the above documentation for accuracy and completeness, and I agree with the above.

## 2019-04-13 ENCOUNTER — Other Ambulatory Visit: Payer: Self-pay

## 2019-04-13 ENCOUNTER — Encounter: Payer: Self-pay | Admitting: Hematology

## 2019-04-13 ENCOUNTER — Inpatient Hospital Stay: Payer: 59 | Attending: Internal Medicine

## 2019-04-13 ENCOUNTER — Inpatient Hospital Stay (HOSPITAL_BASED_OUTPATIENT_CLINIC_OR_DEPARTMENT_OTHER): Payer: 59 | Admitting: Hematology

## 2019-04-13 ENCOUNTER — Inpatient Hospital Stay: Payer: 59

## 2019-04-13 VITALS — BP 142/87 | HR 78 | Temp 98.2°F | Resp 18 | Ht 64.0 in | Wt 195.2 lb

## 2019-04-13 DIAGNOSIS — Z86711 Personal history of pulmonary embolism: Secondary | ICD-10-CM | POA: Insufficient documentation

## 2019-04-13 DIAGNOSIS — Z85820 Personal history of malignant melanoma of skin: Secondary | ICD-10-CM | POA: Diagnosis not present

## 2019-04-13 DIAGNOSIS — D500 Iron deficiency anemia secondary to blood loss (chronic): Secondary | ICD-10-CM

## 2019-04-13 DIAGNOSIS — R5382 Chronic fatigue, unspecified: Secondary | ICD-10-CM | POA: Insufficient documentation

## 2019-04-13 DIAGNOSIS — Z7901 Long term (current) use of anticoagulants: Secondary | ICD-10-CM | POA: Diagnosis not present

## 2019-04-13 DIAGNOSIS — E538 Deficiency of other specified B group vitamins: Secondary | ICD-10-CM | POA: Diagnosis not present

## 2019-04-13 DIAGNOSIS — Z79899 Other long term (current) drug therapy: Secondary | ICD-10-CM | POA: Insufficient documentation

## 2019-04-13 DIAGNOSIS — N920 Excessive and frequent menstruation with regular cycle: Secondary | ICD-10-CM | POA: Diagnosis not present

## 2019-04-13 DIAGNOSIS — R0602 Shortness of breath: Secondary | ICD-10-CM | POA: Diagnosis not present

## 2019-04-13 LAB — CBC WITH DIFFERENTIAL (CANCER CENTER ONLY)
Abs Immature Granulocytes: 0.01 10*3/uL (ref 0.00–0.07)
Basophils Absolute: 0 10*3/uL (ref 0.0–0.1)
Basophils Relative: 1 %
Eosinophils Absolute: 0 10*3/uL (ref 0.0–0.5)
Eosinophils Relative: 1 %
HCT: 30.5 % — ABNORMAL LOW (ref 36.0–46.0)
Hemoglobin: 9.4 g/dL — ABNORMAL LOW (ref 12.0–15.0)
Immature Granulocytes: 0 %
Lymphocytes Relative: 25 %
Lymphs Abs: 0.9 10*3/uL (ref 0.7–4.0)
MCH: 29 pg (ref 26.0–34.0)
MCHC: 30.8 g/dL (ref 30.0–36.0)
MCV: 94.1 fL (ref 80.0–100.0)
Monocytes Absolute: 0.3 10*3/uL (ref 0.1–1.0)
Monocytes Relative: 8 %
Neutro Abs: 2.4 10*3/uL (ref 1.7–7.7)
Neutrophils Relative %: 65 %
Platelet Count: 282 10*3/uL (ref 150–400)
RBC: 3.24 MIL/uL — ABNORMAL LOW (ref 3.87–5.11)
RDW: 19 % — ABNORMAL HIGH (ref 11.5–15.5)
WBC Count: 3.7 10*3/uL — ABNORMAL LOW (ref 4.0–10.5)
nRBC: 0 % (ref 0.0–0.2)

## 2019-04-13 LAB — RETIC PANEL
Immature Retic Fract: 12.7 % (ref 2.3–15.9)
RBC.: 3.27 MIL/uL — ABNORMAL LOW (ref 3.87–5.11)
Retic Count, Absolute: 62.5 10*3/uL (ref 19.0–186.0)
Retic Ct Pct: 1.9 % (ref 0.4–3.1)
Reticulocyte Hemoglobin: 34.7 pg (ref >27.9)

## 2019-04-13 LAB — FERRITIN: Ferritin: 65 ng/mL (ref 11–307)

## 2019-04-13 LAB — IRON AND TIBC
Iron: 46 ug/dL (ref 41–142)
Saturation Ratios: 20 % — ABNORMAL LOW (ref 21–57)
TIBC: 236 ug/dL (ref 236–444)
UIBC: 190 ug/dL (ref 120–384)

## 2019-04-13 MED ORDER — CYANOCOBALAMIN 1000 MCG/ML IJ SOLN: 1000.0000 ug | Freq: Once | INTRAMUSCULAR | Status: DC

## 2019-04-13 NOTE — Patient Instructions (Signed)

## 2019-04-13 NOTE — Progress Notes (Unsigned)
Per MD Burr Medico pt not receiving B12 shot today, pt aware and pharmacy aware.

## 2019-04-14 ENCOUNTER — Telehealth: Payer: Self-pay

## 2019-04-14 ENCOUNTER — Telehealth: Payer: Self-pay | Admitting: Hematology

## 2019-04-14 NOTE — Telephone Encounter (Signed)
I talk with patient regarding schedule  

## 2019-04-14 NOTE — Telephone Encounter (Signed)
-----   Message from Truitt Merle, MD sent at 04/14/2019 11:26 AM EST ----- Please let pt know her iron level was better yesterday, please schedule iv feraheme on her next appointment on 1/21, and let pt know, thanks   Truitt Merle  04/14/2019

## 2019-04-14 NOTE — Telephone Encounter (Signed)
I spoke with Rhonda Ford regarding her Iron level.  I told her that Dr. Burr Medico wants her to have another feraheme infusion.  She verbalized understanding.  I sent a scheduling message to schedule for 04/30/2019.

## 2019-04-14 NOTE — Telephone Encounter (Signed)
vm left for pt to call Dr. Ernestina Penna office.

## 2019-04-20 DIAGNOSIS — J3489 Other specified disorders of nose and nasal sinuses: Secondary | ICD-10-CM | POA: Diagnosis not present

## 2019-04-20 DIAGNOSIS — Z20828 Contact with and (suspected) exposure to other viral communicable diseases: Secondary | ICD-10-CM | POA: Diagnosis not present

## 2019-04-20 DIAGNOSIS — Z23 Encounter for immunization: Secondary | ICD-10-CM | POA: Diagnosis not present

## 2019-04-29 ENCOUNTER — Telehealth: Payer: Self-pay | Admitting: Hematology

## 2019-04-29 NOTE — Telephone Encounter (Signed)
Changed appt per RN Melanie for infusion room availability. Unable to reach pt - left message with new appt time

## 2019-04-30 ENCOUNTER — Inpatient Hospital Stay: Payer: 59

## 2019-04-30 ENCOUNTER — Other Ambulatory Visit: Payer: Self-pay

## 2019-04-30 VITALS — BP 130/80 | HR 76 | Temp 98.0°F | Resp 18

## 2019-04-30 DIAGNOSIS — Z85820 Personal history of malignant melanoma of skin: Secondary | ICD-10-CM | POA: Diagnosis not present

## 2019-04-30 DIAGNOSIS — E538 Deficiency of other specified B group vitamins: Secondary | ICD-10-CM | POA: Diagnosis not present

## 2019-04-30 DIAGNOSIS — D500 Iron deficiency anemia secondary to blood loss (chronic): Secondary | ICD-10-CM

## 2019-04-30 DIAGNOSIS — Z7901 Long term (current) use of anticoagulants: Secondary | ICD-10-CM | POA: Diagnosis not present

## 2019-04-30 DIAGNOSIS — N920 Excessive and frequent menstruation with regular cycle: Secondary | ICD-10-CM | POA: Diagnosis not present

## 2019-04-30 DIAGNOSIS — Z86711 Personal history of pulmonary embolism: Secondary | ICD-10-CM | POA: Diagnosis not present

## 2019-04-30 DIAGNOSIS — R0602 Shortness of breath: Secondary | ICD-10-CM | POA: Diagnosis not present

## 2019-04-30 DIAGNOSIS — R5382 Chronic fatigue, unspecified: Secondary | ICD-10-CM | POA: Diagnosis not present

## 2019-04-30 DIAGNOSIS — Z79899 Other long term (current) drug therapy: Secondary | ICD-10-CM | POA: Diagnosis not present

## 2019-04-30 MED ORDER — SODIUM CHLORIDE 0.9 % IV SOLN
510.0000 mg | Freq: Once | INTRAVENOUS | Status: AC
  Administered 2019-04-30: 510 mg via INTRAVENOUS
  Filled 2019-04-30: qty 510

## 2019-04-30 MED ORDER — CYANOCOBALAMIN 1000 MCG/ML IJ SOLN
1000.0000 ug | Freq: Once | INTRAMUSCULAR | Status: AC
  Administered 2019-04-30: 1000 ug via INTRAMUSCULAR

## 2019-04-30 MED ORDER — SODIUM CHLORIDE 0.9 % IV SOLN
INTRAVENOUS | Status: DC
  Filled 2019-04-30: qty 250

## 2019-04-30 MED ORDER — CYANOCOBALAMIN 1000 MCG/ML IJ SOLN
INTRAMUSCULAR | Status: AC
  Filled 2019-04-30: qty 1

## 2019-04-30 MED ORDER — CYANOCOBALAMIN 1000 MCG/ML IJ SOLN: 1000.0000 ug | Freq: Once | INTRAMUSCULAR | Status: DC

## 2019-04-30 NOTE — Patient Instructions (Signed)

## 2019-05-19 ENCOUNTER — Telehealth: Payer: Self-pay | Admitting: Family Medicine

## 2019-05-19 MED FILL — ELIQUIS 5 MG TABLET: 5 | 30 days supply | Qty: 60 | Fill #0

## 2019-05-19 NOTE — Telephone Encounter (Signed)
Patient is calling and requesting a refill for Levothyroxine sent to [facility].

## 2019-05-20 ENCOUNTER — Other Ambulatory Visit: Payer: Self-pay | Admitting: Family Medicine

## 2019-05-20 ENCOUNTER — Other Ambulatory Visit: Payer: Self-pay

## 2019-05-20 DIAGNOSIS — E039 Hypothyroidism, unspecified: Secondary | ICD-10-CM

## 2019-05-20 MED ORDER — LEVOTHYROXINE SODIUM 25 MCG PO TABS: 25.0000 ug | ORAL_TABLET | Freq: Every day | ORAL | 3 refills | Status: DC

## 2019-05-20 MED FILL — LEVOTHYROXINE SODIUM 25 MCG: 25 | 90 days supply | Qty: 90 | Fill #0

## 2019-05-20 NOTE — Telephone Encounter (Signed)
Sent refill request to Dr. Loletha Grayer for the ok.

## 2019-05-20 NOTE — Telephone Encounter (Signed)
Last fill 12/31/18  #30/2 Last OV 02/05/19

## 2019-06-01 ENCOUNTER — Inpatient Hospital Stay: Payer: 59 | Attending: Internal Medicine

## 2019-06-01 ENCOUNTER — Inpatient Hospital Stay: Payer: 59

## 2019-06-29 ENCOUNTER — Inpatient Hospital Stay: Payer: 59

## 2019-06-29 ENCOUNTER — Inpatient Hospital Stay: Payer: 59 | Attending: Internal Medicine

## 2019-06-29 ENCOUNTER — Other Ambulatory Visit: Payer: Self-pay

## 2019-06-29 VITALS — BP 148/85 | HR 82 | Temp 98.0°F | Resp 18

## 2019-06-29 DIAGNOSIS — E538 Deficiency of other specified B group vitamins: Secondary | ICD-10-CM | POA: Insufficient documentation

## 2019-06-29 DIAGNOSIS — D500 Iron deficiency anemia secondary to blood loss (chronic): Secondary | ICD-10-CM

## 2019-06-29 DIAGNOSIS — N920 Excessive and frequent menstruation with regular cycle: Secondary | ICD-10-CM | POA: Diagnosis not present

## 2019-06-29 LAB — CBC WITH DIFFERENTIAL (CANCER CENTER ONLY)
Abs Immature Granulocytes: 0.01 10*3/uL (ref 0.00–0.07)
Basophils Absolute: 0 10*3/uL (ref 0.0–0.1)
Basophils Relative: 0 %
Eosinophils Absolute: 0.1 10*3/uL (ref 0.0–0.5)
Eosinophils Relative: 3 %
HCT: 32.4 % — ABNORMAL LOW (ref 36.0–46.0)
Hemoglobin: 9.8 g/dL — ABNORMAL LOW (ref 12.0–15.0)
Immature Granulocytes: 0 %
Lymphocytes Relative: 27 %
Lymphs Abs: 1.2 10*3/uL (ref 0.7–4.0)
MCH: 26.2 pg (ref 26.0–34.0)
MCHC: 30.2 g/dL (ref 30.0–36.0)
MCV: 86.6 fL (ref 80.0–100.0)
Monocytes Absolute: 0.4 10*3/uL (ref 0.1–1.0)
Monocytes Relative: 9 %
Neutro Abs: 2.7 10*3/uL (ref 1.7–7.7)
Neutrophils Relative %: 61 %
Platelet Count: 251 10*3/uL (ref 150–400)
RBC: 3.74 MIL/uL — ABNORMAL LOW (ref 3.87–5.11)
RDW: 14.9 % (ref 11.5–15.5)
WBC Count: 4.5 10*3/uL (ref 4.0–10.5)
nRBC: 0 % (ref 0.0–0.2)

## 2019-06-29 LAB — RETIC PANEL
Immature Retic Fract: 15 % (ref 2.3–15.9)
RBC.: 3.7 MIL/uL — ABNORMAL LOW (ref 3.87–5.11)
Retic Count, Absolute: 64 10*3/uL (ref 19.0–186.0)
Retic Ct Pct: 1.7 % (ref 0.4–3.1)
Reticulocyte Hemoglobin: 23.7 pg — ABNORMAL LOW (ref >27.9)

## 2019-06-29 LAB — IRON AND TIBC
Iron: 25 ug/dL — ABNORMAL LOW (ref 41–142)
Saturation Ratios: 8 % — ABNORMAL LOW (ref 21–57)
TIBC: 305 ug/dL (ref 236–444)
UIBC: 280 ug/dL (ref 120–384)

## 2019-06-29 LAB — FERRITIN: Ferritin: <4 ng/mL — ABNORMAL LOW (ref 11–307)

## 2019-06-29 MED ORDER — CYANOCOBALAMIN 1000 MCG/ML IJ SOLN
1000.0000 ug | Freq: Once | INTRAMUSCULAR | Status: AC
  Administered 2019-06-29: 1000 ug via INTRAMUSCULAR

## 2019-06-29 MED ORDER — CYANOCOBALAMIN 1000 MCG/ML IJ SOLN
INTRAMUSCULAR | Status: AC
  Filled 2019-06-29: qty 1

## 2019-06-30 ENCOUNTER — Telehealth: Payer: Self-pay | Admitting: Hematology

## 2019-06-30 NOTE — Telephone Encounter (Signed)
Scheduled apt per 3/23 sch message - pt aware of appt date and time

## 2019-07-06 ENCOUNTER — Other Ambulatory Visit: Payer: Self-pay

## 2019-07-06 ENCOUNTER — Inpatient Hospital Stay: Payer: 59

## 2019-07-06 VITALS — BP 134/80 | HR 77 | Temp 98.4°F | Resp 16

## 2019-07-06 DIAGNOSIS — N920 Excessive and frequent menstruation with regular cycle: Secondary | ICD-10-CM | POA: Diagnosis not present

## 2019-07-06 DIAGNOSIS — E538 Deficiency of other specified B group vitamins: Secondary | ICD-10-CM | POA: Diagnosis not present

## 2019-07-06 DIAGNOSIS — D500 Iron deficiency anemia secondary to blood loss (chronic): Secondary | ICD-10-CM

## 2019-07-06 MED ORDER — SODIUM CHLORIDE 0.9 % IV SOLN
510.0000 mg | Freq: Once | INTRAVENOUS | Status: AC
  Administered 2019-07-06: 510 mg via INTRAVENOUS
  Filled 2019-07-06: qty 17

## 2019-07-06 MED ORDER — SODIUM CHLORIDE 0.9 % IV SOLN
INTRAVENOUS | Status: DC
  Filled 2019-07-06: qty 250

## 2019-07-06 MED FILL — ELIQUIS 5 MG TABLET: 5 | 30 days supply | Qty: 60 | Fill #1

## 2019-07-06 NOTE — Patient Instructions (Signed)

## 2019-07-06 NOTE — Progress Notes (Signed)
Pt refused 30 minute post observation period.  Pt needed to get to work.  Feeling well, no complaints, VSS.

## 2019-07-14 ENCOUNTER — Other Ambulatory Visit: Payer: Self-pay | Admitting: Hematology

## 2019-07-15 ENCOUNTER — Inpatient Hospital Stay: Payer: 59 | Attending: Internal Medicine

## 2019-07-15 ENCOUNTER — Other Ambulatory Visit: Payer: Self-pay

## 2019-07-15 VITALS — BP 128/79 | HR 64 | Temp 98.0°F | Resp 18

## 2019-07-15 DIAGNOSIS — D500 Iron deficiency anemia secondary to blood loss (chronic): Secondary | ICD-10-CM | POA: Diagnosis not present

## 2019-07-15 DIAGNOSIS — N924 Excessive bleeding in the premenopausal period: Secondary | ICD-10-CM | POA: Diagnosis not present

## 2019-07-15 MED ORDER — LORATADINE 10 MG PO TABS
10.0000 mg | ORAL_TABLET | Freq: Once | ORAL | Status: AC
  Administered 2019-07-15: 09:00:00 10 mg via ORAL

## 2019-07-15 MED ORDER — SODIUM CHLORIDE 0.9 % IV SOLN
Freq: Once | INTRAVENOUS | Status: AC
  Filled 2019-07-15: qty 250

## 2019-07-15 MED ORDER — LORATADINE 10 MG PO TABS
ORAL_TABLET | ORAL | Status: AC
  Filled 2019-07-15: qty 1

## 2019-07-15 MED ORDER — SODIUM CHLORIDE 0.9 % IV SOLN
200.0000 mg | Freq: Once | INTRAVENOUS | Status: AC
  Administered 2019-07-15: 200 mg via INTRAVENOUS
  Filled 2019-07-15: qty 10

## 2019-07-15 NOTE — Patient Instructions (Signed)

## 2019-07-15 NOTE — Progress Notes (Signed)
Update: patient prefers fewer visits. Okay per Dr. Burr Medico to condense her last two treatments to a single 400mg  infusion. Scheduling notified and orders updated.   Demetrius Charity, PharmD, BCPS, [facility] Phone: [phone] 07/15/2019

## 2019-07-15 NOTE — Progress Notes (Signed)
Patient's insurance required iron formulation change from Feraheme to venofer mid-treatment cycle. Dr. Burr Medico sent schedule message for an additional 3 venofer infusions to complete this course of treatment. Patient will be educated on change during treatment today.  Rhonda Ford, PharmD, BCPS, [facility] Phone: [phone] 07/15/2019

## 2019-07-21 ENCOUNTER — Encounter: Payer: Self-pay | Admitting: Nurse Practitioner

## 2019-07-21 ENCOUNTER — Telehealth (INDEPENDENT_AMBULATORY_CARE_PROVIDER_SITE_OTHER): Payer: 59 | Admitting: Nurse Practitioner

## 2019-07-21 ENCOUNTER — Other Ambulatory Visit: Payer: Self-pay

## 2019-07-21 VITALS — BP 134/70 | HR 78 | Ht 64.0 in | Wt 196.0 lb

## 2019-07-21 DIAGNOSIS — J029 Acute pharyngitis, unspecified: Secondary | ICD-10-CM | POA: Diagnosis not present

## 2019-07-21 MED ORDER — AMOXICILLIN 875 MG PO TABS: 875.0000 mg | ORAL_TABLET | Freq: Two times a day (BID) | ORAL | 0 refills | Status: DC

## 2019-07-21 MED FILL — AMOXICILLIN 875 MG TABS: 875 | 10 days supply | Qty: 20 | Fill #0

## 2019-07-21 NOTE — Patient Instructions (Signed)
Pharyngitis  Pharyngitis is a sore throat (pharynx). This is when there is redness, pain, and swelling in your throat. Most of the time, this condition gets better on its own. In some cases, you may need medicine. Follow these instructions at home:  Take over-the-counter and prescription medicines only as told by your doctor. ? If you were prescribed an antibiotic medicine, take it as told by your doctor. Do not stop taking the antibiotic even if you start to feel better. ? Do not give children aspirin. Aspirin has been linked to Reye syndrome.  Drink enough water and fluids to keep your pee (urine) clear or pale yellow.  Get a lot of rest.  Rinse your mouth (gargle) with a salt-water mixture 3-4 times a day or as needed. To make a salt-water mixture, completely dissolve -1 tsp of salt in 1 cup of warm water.  If your doctor approves, you may use throat lozenges or sprays to soothe your throat. Contact a doctor if:  You have large, tender lumps in your neck.  You have a rash.  You cough up green, yellow-brown, or bloody spit. Get help right away if:  You have a stiff neck.  You drool or cannot swallow liquids.  You cannot drink or take medicines without throwing up.  You have very bad pain that does not go away with medicine.  You have problems breathing, and it is not from a stuffy nose.  You have new pain and swelling in your knees, ankles, wrists, or elbows. Summary  Pharyngitis is a sore throat (pharynx). This is when there is redness, pain, and swelling in your throat.  If you were prescribed an antibiotic medicine, take it as told by your doctor. Do not stop taking the antibiotic even if you start to feel better.  Most of the time, pharyngitis gets better on its own. Sometimes, you may need medicine. This information is not intended to replace advice given to you by your health care provider. Make sure you discuss any questions you have with your health care  provider. Document Revised: 03/08/2017 Document Reviewed: 05/01/2016 Elsevier Patient Education  2020 Elsevier Inc.  

## 2019-07-21 NOTE — Progress Notes (Signed)
Virtual Visit via Video Note  I connected with@ on 07/21/19 at 12:30 PM EDT by a video enabled telemedicine application and verified that I am speaking with the correct person using two identifiers.  Location: Patient:Home Provider: Office Participants: patient and provider  I discussed the limitations of evaluation and management by telemedicine and the availability of in person appointments. I also discussed with the patient that there may be a patient responsible charge related to this service. The patient expressed understanding and agreed to proceed.  [id number] throat 3-4 days ago//white patches on throat//low grade fever 100.4//tried tyelonel helped headache not throat//pt didn't have means for temp when I called  History of Present Illness: Sore Throat  This is a new problem. The current episode started in the past 7 days. The problem has been gradually worsening. The maximum temperature recorded prior to her arrival was 100.4 - 100.9 F. The fever has been present for 1 to 2 days. The pain is moderate. Associated symptoms include ear pain, headaches, a plugged ear sensation and swollen glands. Pertinent negatives include no abdominal pain, congestion, coughing, diarrhea, drooling, ear discharge, hoarse voice, neck pain, shortness of breath, stridor, trouble swallowing or vomiting. She has had no exposure to strep or mono. She has tried acetaminophen for the symptoms. The treatment provided mild relief.   Observations/Objective: Physical Exam  HENT:  Mouth/Throat: Uvula is midline. Oropharyngeal exudate and posterior oropharyngeal erythema present.  Pulmonary/Chest: Effort normal.  Musculoskeletal:     Cervical back: Normal range of motion and neck supple.  Lymphadenopathy:    She has cervical adenopathy.  Skin: Skin is warm and dry. No rash noted.  Vitals reviewed.   Assessment and Plan: Rhonda Ford was seen today for sore throat.  Diagnoses and all orders for this visit:  Acute  pharyngitis, unspecified etiology -     amoxicillin (AMOXIL) 875 MG tablet; Take 1 tablet (875 mg total) by mouth 2 (two) times daily.   Follow Up Instructions: See avs   I discussed the assessment and treatment plan with the patient. The patient was provided an opportunity to ask questions and all were answered. The patient agreed with the plan and demonstrated an understanding of the instructions.   The patient was advised to call back or seek an in-person evaluation if the symptoms worsen or if the condition fails to improve as anticipated.   Wilfred Lacy, NP

## 2019-07-22 ENCOUNTER — Ambulatory Visit: Payer: 59

## 2019-07-29 NOTE — Progress Notes (Signed)
Spoke with Dr. Burr Medico - patient may receive venofer 300mg  IV on 4/22 to complete her iron course. This will get the patient closer to the total 1000mg  of iron between feraheme and venofer infusions.   Demetrius Charity, PharmD, BCPS, [facility] Pharmacist Pharm[facility] Phone: [phone] 07/29/2019

## 2019-07-30 ENCOUNTER — Inpatient Hospital Stay: Payer: 59

## 2019-07-30 ENCOUNTER — Ambulatory Visit: Payer: 59

## 2019-07-30 ENCOUNTER — Other Ambulatory Visit: Payer: Self-pay

## 2019-07-30 VITALS — BP 142/79 | HR 62 | Temp 98.2°F | Resp 17

## 2019-07-30 DIAGNOSIS — N924 Excessive bleeding in the premenopausal period: Secondary | ICD-10-CM | POA: Diagnosis not present

## 2019-07-30 DIAGNOSIS — D500 Iron deficiency anemia secondary to blood loss (chronic): Secondary | ICD-10-CM | POA: Diagnosis not present

## 2019-07-30 LAB — IRON AND TIBC
Iron: 65 ug/dL (ref 41–142)
Saturation Ratios: 26 % (ref 21–57)
TIBC: 250 ug/dL (ref 236–444)
UIBC: 185 ug/dL (ref 120–384)

## 2019-07-30 LAB — CBC WITH DIFFERENTIAL (CANCER CENTER ONLY)
Abs Immature Granulocytes: 0.02 10*3/uL (ref 0.00–0.07)
Basophils Absolute: 0 10*3/uL (ref 0.0–0.1)
Basophils Relative: 0 %
Eosinophils Absolute: 0.1 10*3/uL (ref 0.0–0.5)
Eosinophils Relative: 1 %
HCT: 39.8 % (ref 36.0–46.0)
Hemoglobin: 12.6 g/dL (ref 12.0–15.0)
Immature Granulocytes: 0 %
Lymphocytes Relative: 29 %
Lymphs Abs: 1.3 10*3/uL (ref 0.7–4.0)
MCH: 27.6 pg (ref 26.0–34.0)
MCHC: 31.7 g/dL (ref 30.0–36.0)
MCV: 87.1 fL (ref 80.0–100.0)
Monocytes Absolute: 0.3 10*3/uL (ref 0.1–1.0)
Monocytes Relative: 7 %
Neutro Abs: 2.8 10*3/uL (ref 1.7–7.7)
Neutrophils Relative %: 63 %
Platelet Count: 237 10*3/uL (ref 150–400)
RBC: 4.57 MIL/uL (ref 3.87–5.11)
RDW: 18.3 % — ABNORMAL HIGH (ref 11.5–15.5)
WBC Count: 4.5 10*3/uL (ref 4.0–10.5)
nRBC: 0 % (ref 0.0–0.2)

## 2019-07-30 LAB — RETIC PANEL
Immature Retic Fract: 10.1 % (ref 2.3–15.9)
RBC.: 4.48 MIL/uL (ref 3.87–5.11)
Retic Count, Absolute: 64.5 10*3/uL (ref 19.0–186.0)
Retic Ct Pct: 1.4 % (ref 0.4–3.1)
Reticulocyte Hemoglobin: 35.7 pg (ref >27.9)

## 2019-07-30 LAB — FERRITIN: Ferritin: 87 ng/mL (ref 11–307)

## 2019-07-30 MED ORDER — SODIUM CHLORIDE 0.9 % IV SOLN
Freq: Once | INTRAVENOUS | Status: AC
  Filled 2019-07-30: qty 250

## 2019-07-30 MED ORDER — CYANOCOBALAMIN 1000 MCG/ML IJ SOLN
INTRAMUSCULAR | Status: AC
  Filled 2019-07-30: qty 1

## 2019-07-30 MED ORDER — LORATADINE 10 MG PO TABS
10.0000 mg | ORAL_TABLET | Freq: Once | ORAL | Status: AC
  Administered 2019-07-30: 10 mg via ORAL

## 2019-07-30 MED ORDER — SODIUM CHLORIDE 0.9 % IV SOLN
300.0000 mg | Freq: Once | INTRAVENOUS | Status: AC
  Administered 2019-07-30: 300 mg via INTRAVENOUS
  Filled 2019-07-30: qty 5

## 2019-07-30 MED ORDER — LORATADINE 10 MG PO TABS
ORAL_TABLET | ORAL | Status: AC
  Filled 2019-07-30: qty 1

## 2019-07-30 MED ORDER — CYANOCOBALAMIN 1000 MCG/ML IJ SOLN
1000.0000 ug | Freq: Once | INTRAMUSCULAR | Status: AC
  Administered 2019-07-30: 1000 ug via INTRAMUSCULAR

## 2019-07-30 NOTE — Progress Notes (Signed)
Patient declined to stay for 30-min post Venofer observation period. Vitals stable and patient ambulated out of clinic without incident.

## 2019-07-30 NOTE — Patient Instructions (Signed)
COVID-19 Vaccine Information can be found at: [email or web address] For questions related to vaccine distribution or appointments, please email [email or web address] or call [phone].   Iron Sucrose injection What is this medicine? IRON SUCROSE (AHY ern SOO krohs) is an iron complex. Iron is used to make healthy red blood cells, which carry oxygen and nutrients throughout the body. This medicine is used to treat iron deficiency anemia in people with chronic kidney disease. This medicine may be used for other purposes; ask your health care provider or pharmacist if you have questions. COMMON BRAND NAME(S): Venofer What should I tell my health care provider before I take this medicine? They need to know if you have any of these conditions:  anemia not caused by low iron levels  heart disease  high levels of iron in the blood  kidney disease  liver disease  an unusual or allergic reaction to iron, other medicines, foods, dyes, or preservatives  pregnant or trying to get pregnant  breast-feeding How should I use this medicine? This medicine is for infusion into a vein. It is given by a health care professional in a hospital or clinic setting. Talk to your pediatrician regarding the use of this medicine in children. While this drug may be prescribed for children as young as 2 years for selected conditions, precautions do apply. Overdosage: If you think you have taken too much of this medicine contact a poison control center or emergency room at once. NOTE: This medicine is only for you. Do not share this medicine with others. What if I miss a dose? It is important not to miss your dose. Call your doctor or health care professional if you are unable to keep an appointment. What may interact with this medicine? Do not take this medicine with any of the following medications:  deferoxamine  dimercaprol  other iron  products This medicine may also interact with the following medications:  chloramphenicol  deferasirox This list may not describe all possible interactions. Give your health care provider a list of all the medicines, herbs, non-prescription drugs, or dietary supplements you use. Also tell them if you smoke, drink alcohol, or use illegal drugs. Some items may interact with your medicine. What should I watch for while using this medicine? Visit your doctor or healthcare professional regularly. Tell your doctor or healthcare professional if your symptoms do not start to get better or if they get worse. You may need blood work done while you are taking this medicine. You may need to follow a special diet. Talk to your doctor. Foods that contain iron include: whole grains/cereals, dried fruits, beans, or peas, leafy green vegetables, and organ meats (liver, kidney). What side effects may I notice from receiving this medicine? Side effects that you should report to your doctor or health care professional as soon as possible:  allergic reactions like skin rash, itching or hives, swelling of the face, lips, or tongue  breathing problems  changes in blood pressure  cough  fast, irregular heartbeat  feeling faint or lightheaded, falls  fever or chills  flushing, sweating, or hot feelings  joint or muscle aches/pains  seizures  swelling of the ankles or feet  unusually weak or tired Side effects that usually do not require medical attention (report to your doctor or health care professional if they continue or are bothersome):  diarrhea  feeling achy  headache  irritation at site where injected  nausea, vomiting  stomach upset  tiredness This list may not describe all possible   side effects. Call your doctor for medical advice about side effects. You may report side effects to FDA at 1-800-FDA-1088. Where should I keep my medicine? This drug is given in a hospital or clinic  and will not be stored at home. NOTE: This sheet is a summary. It may not cover all possible information. If you have questions about this medicine, talk to your doctor, pharmacist, or health care provider.  2020 Elsevier/Gold Standard (2011-01-04 17:14:35)  Coronavirus (COVID-19) Are you at risk?  Are you at risk for the Coronavirus (COVID-19)?  To be considered HIGH RISK for Coronavirus (COVID-19), you have to meet the following criteria:  . Traveled to [place], [place], [place], [place] or [place]; or in the [place] to [place], [place], [place], or [place]; and have fever, cough, and shortness of breath within the last 2 weeks of travel OR . Been in close contact with a person diagnosed with COVID-19 within the last 2 weeks and have fever, cough, and shortness of breath . IF YOU DO NOT MEET THESE CRITERIA, YOU ARE CONSIDERED LOW RISK FOR COVID-19.  What to do if you are HIGH RISK for COVID-19?  . If you are having a medical emergency, call 911. . Seek medical care right away. Before you go to a doctor's office, urgent care or emergency department, call ahead and tell them about your recent travel, contact with someone diagnosed with COVID-19, and your symptoms. You should receive instructions from your physician's office regarding next steps of care.  . When you arrive at healthcare provider, tell the healthcare staff immediately you have returned from visiting [place], [place], [place], [place] or [place]; or traveled in the [place] to [place], [place], [place], or [place]; in the last two weeks or you have been in close contact with a person diagnosed with COVID-19 in the last 2 weeks.   . Tell the health care staff about your symptoms: fever, cough and shortness of breath. . After you have been seen by a medical provider, you will be either: o Tested for (COVID-19) and discharged home on quarantine except to seek medical care if symptoms worsen, and asked to   - Stay home and avoid contact with others until you get your results (4-5 days)  - Avoid travel on public transportation if possible (such as bus, train, or airplane) or o Sent to the Emergency Department by EMS for evaluation, COVID-19 testing, and possible admission depending on your condition and test results.  What to do if you are LOW RISK for COVID-19?  Reduce your risk of any infection by using the same precautions used for avoiding the common cold or flu:  . Wash your hands often with soap and warm water for at least 20 seconds.  If soap and water are not readily available, use an alcohol-based hand sanitizer with at least 60% alcohol.  . If coughing or sneezing, cover your mouth and nose by coughing or sneezing into the elbow areas of your shirt or coat, into a tissue or into your sleeve (not your hands). . Avoid shaking hands with others and consider head nods or verbal greetings only. . Avoid touching your eyes, nose, or mouth with unwashed hands.  . Avoid close contact with people who are sick. . Avoid places or events with large numbers of people in one location, like concerts or sporting events. . Carefully consider travel plans you have or are making. . If you are planning any travel   outside or inside the [place], visit the CDC's Travelers' Health webpage for the latest health notices. . If you have some symptoms but not all symptoms, continue to monitor at home and seek medical attention if your symptoms worsen. . If you are having a medical emergency, call 911.   ADDITIONAL HEALTHCARE OPTIONS FOR PATIENTS  [facility] / e-Visit: [email or web address]         [facility]: [phone]   Urgent Care: [phone]                   [facility]: [phone]   

## 2019-08-02 ENCOUNTER — Encounter: Payer: Self-pay | Admitting: Hematology

## 2019-08-07 MED FILL — ELIQUIS 5 MG TABLET: 5 | 30 days supply | Qty: 60 | Fill #2

## 2019-08-27 NOTE — Progress Notes (Signed)
[place]   Telephone:(336) [phone] Fax:(336) [phone]   Clinic Follow up Note   Patient Care Team: Ronnald Nian, DO as PCP - General (Family Medicine)  Date of Service:  08/28/2019  CHIEF COMPLAINT: F/u of PEand iron deficient anemia  CURRENT THERAPY:  -IV Feraheme as needed since 06/2018. Due to insurance she was switched to IV Venofer on 07/15/19. -oral iron and oral B12 -monthly B12 injection since 06/25/18  INTERVAL HISTORY:  Rhonda Ford is here for a follow up of PE and IDA. She presents to the clinic alone. She notes having headaches and fatigue lately. She notes her last period was beginning of 05/2019. She feels she is starting menopause. She also feels nauseous and flushed. She notes after iron in April she felt better, but her symptoms occurred twice a week. She notes she does not sleep well. She wakes up 5-6 times a night. She notes Venofer tripled her out of pocket to $1600 after insurance. She is on a specific diet plan to lose weight.    REVIEW OF SYSTEMS:   Constitutional: Denies fevers, chills or abnormal weight loss (+) HA twice a week (+) trouble sleeping (+) Fatigue  Eyes: Denies blurriness of vision Ears, nose, mouth, throat, and face: Denies mucositis or sore throat Respiratory: Denies cough, dyspnea or wheezes Cardiovascular: Denies palpitation, chest discomfort or lower extremity swelling Gastrointestinal:  Denies heartburn or change in bowel habits (+) nausea  Skin: Denies abnormal skin rashes Lymphatics: Denies new lymphadenopathy or easy bruising Neurological:Denies numbness, tingling or new weaknesses Behavioral/Psych: Mood is stable, no new changes  All other systems were reviewed with the patient and are negative.  MEDICAL HISTORY:  Past Medical History:  Diagnosis Date  . B12 deficiency   . Iron deficiency anemia due to chronic blood loss   . Melanoma ([place])   . Migraines   . Pulmonary emboli ([place]) 01/2018  . Skin  cancer     SURGICAL HISTORY: Past Surgical History:  Procedure Laterality Date  . gyn surgeries    . melanoma surgery      leg    I have reviewed the social history and family history with the patient and they are unchanged from previous note.  ALLERGIES:  has No Known Allergies.  MEDICATIONS:  Current Outpatient Medications  Medication Sig Dispense Refill  . amoxicillin (AMOXIL) 875 MG tablet Take 1 tablet (875 mg total) by mouth 2 (two) times daily. 20 tablet 0  . apixaban (ELIQUIS) 5 MG TABS tablet Take 1 tablet (5 mg total) by mouth 2 (two) times daily. 60 tablet 4  . Ferrous Sulfate (SLOW FE PO) Take by mouth.    . levothyroxine (SYNTHROID) 25 MCG tablet Take 1 tablet (25 mcg total) by mouth daily before breakfast. 90 tablet 3   No current facility-administered medications for this visit.    PHYSICAL EXAMINATION: ECOG PERFORMANCE STATUS: 1 - Symptomatic but completely ambulatory  Vitals:   08/28/19 0947  BP: (!) 141/80  Pulse: 66  Resp: 17  Temp: 97.7 F (36.5 C)  SpO2: 100%   Filed Weights   08/28/19 0947  Weight: 194 lb 4.8 oz (88.1 kg)    Due to COVID19 we will limit examination to appearance. Patient had no complaints.  GENERAL:alert, no distress and comfortable SKIN: skin color normal, no rashes or significant lesions EYES: normal, Conjunctiva are pink and non-injected, sclera clear  NEURO: alert & oriented x 3 with fluent speech   LABORATORY DATA:  I  have reviewed the data as listed CBC Latest Ref Rng & Units 08/28/2019 07/30/2019 06/29/2019  WBC 4.0 - 10.5 K/uL 4.5 4.5 4.5  Hemoglobin 12.0 - 15.0 g/dL 11.9(L) 12.6 9.8(L)  Hematocrit 36.0 - 46.0 % 37.2 39.8 32.4(L)  Platelets 150 - 400 K/uL 216 237 251     CMP Latest Ref Rng & Units 11/21/2018 08/08/2018 06/19/2018  Glucose 70 - 99 mg/dL 99 99 89  BUN 6 - 20 mg/dL 9 18 20   Creatinine 0.44 - 1.00 mg/dL 0.67 0.72 0.83  Sodium 135 - 145 mmol/L 140 139 139  Potassium 3.5 - 5.1 mmol/L 3.7 3.8 4.2    Chloride 98 - 111 mmol/L 107 107 107  CO2 22 - 32 mmol/L 24 24 22   Calcium 8.9 - 10.3 mg/dL 8.5(L) 8.5(L) 8.8(L)  Total Protein 6.5 - 8.1 g/dL 6.1(L) 6.8 7.4  Total Bilirubin 0.3 - 1.2 mg/dL 0.2(L) 0.3 0.5  Alkaline Phos 38 - 126 U/L 69 75 82  AST 15 - 41 U/L 14(L) 11(L) 15  ALT 0 - 44 U/L 15 15 14       RADIOGRAPHIC STUDIES: I have personally reviewed the radiological images as listed and agreed with the findings in the report. No results found.   ASSESSMENT & PLAN:  Rhonda Ford is a 49 y.o. female with    1. Iron deficiency anemia,B12 deficiency -Likely secondary to menorrhagia.Unclear the etiology about her B12 deficiency -Per patient she has been anemic ranging from 7.5-10 since she was an adolescent with chronic fatigue. This had not been worked up until her PE in late 2019. -Workup done by Dr. Walden Field in 06/2018 is consistent with iron deficiency anemia.  -Her 01/2018 colonoscopy and EGD which was normal except stomach ulcer, no H. Pylorie. She has recently stopped PPI -She has had long history of menorrhagia which is the likely cause of her anemia. Since 04/2018 her periods have been irregular and occasionally lighter. She feels she may be perimenopausal.  -She is currently on IV Feraheme as needed since 06/2018. Due to insurance she was switched to IV Venofer on 07/15/19. Last dose given 07/30/19.  -She also has B12 deficiencybased on lab in early 2020. She has had no gastric or GI surgeries. She is currently on monthly B12 injections since early 2020 and oral B12. -She is overall stable but symptoms of headaches, flushes, nausea and fatigue persist, not sure if it is related to her perimenopause. Labs reviewed, Hg 11.9. Iron panel still pending.  -She notes with Venofer her out of pocket cost is over $10,000 after insurance. I discussed increased Venofer dose so she requires less frequent visits. She is agreeable.  -Monitor with monthly labs. Continue B12 injection  monthly. Continue oral B12 and Oral iron   -Continue IV Venofer. Goal is to keep ferritin above 100  -F/u in 6 months   2.H/oPulmonary Emboli, Unprovoked -Occurred in10/2019. No PE seen on 07/03/18 Angio CT chest.  -She has been treated with Eliquis since3/2020. Given this was significant, unprovokedPEand no family history of PE, it has been recommended to continue indefinitely.  -She previously had another bout of menorrhagia but did not stop Eliquis given her concern for another PE.  -Given anemia from menorrhagia, I recommend she hold Eliquis for a few days during her period. She is agreeable.    3. 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 She is followed by dermatologywho she saw when she lived in [place].  4. Headaches, Trouble sleeping, nausea, fatigue, flushes  -She has been having headaches twice a week and more fatigued lately along with nausea and being flushed She attributed this to starting menopause. He last period was in 05/2019. I recommend she f/u with her Gyn for more evaluation.  -I discussed her headaches and fatigue could be from her lack of sleep. She wakes up 5-6 times a night. I discussed the option of SSRI to help her symptoms. She declined for now. I also suggest Benadryl to help her sleep as well.    PLAN:  -Proceed with B12 injection today. -Continue oral B12 and oral iron  -Lab and B12 injection monthly X6.  -iv iron if ferritin<100.  Today's ferritin is 86, will give 1 dose of venofer 400 mg next week -F/u in 6 months   No problem-specific Assessment & Plan notes found for this encounter.   No orders of the defined types were placed in this encounter.  All questions were answered. The patient knows to call the clinic with any problems, questions or concerns. No barriers to learning was detected. The total time spent in the appointment was 25 minutes.     Truitt Merle, MD 08/28/2019   I, Joslyn Devon, am acting as scribe for Truitt Merle, MD.   I have reviewed the above documentation for accuracy and completeness, and I agree with the above.

## 2019-08-28 ENCOUNTER — Inpatient Hospital Stay: Payer: 59 | Admitting: Hematology

## 2019-08-28 ENCOUNTER — Inpatient Hospital Stay: Payer: 59 | Attending: Internal Medicine

## 2019-08-28 ENCOUNTER — Encounter: Payer: Self-pay | Admitting: Hematology

## 2019-08-28 ENCOUNTER — Other Ambulatory Visit: Payer: Self-pay

## 2019-08-28 ENCOUNTER — Inpatient Hospital Stay: Payer: 59

## 2019-08-28 VITALS — BP 141/80 | HR 66 | Temp 97.7°F | Resp 17 | Ht 64.0 in | Wt 194.3 lb

## 2019-08-28 DIAGNOSIS — Z85820 Personal history of malignant melanoma of skin: Secondary | ICD-10-CM | POA: Insufficient documentation

## 2019-08-28 DIAGNOSIS — R519 Headache, unspecified: Secondary | ICD-10-CM | POA: Insufficient documentation

## 2019-08-28 DIAGNOSIS — Z79899 Other long term (current) drug therapy: Secondary | ICD-10-CM | POA: Insufficient documentation

## 2019-08-28 DIAGNOSIS — R232 Flushing: Secondary | ICD-10-CM | POA: Insufficient documentation

## 2019-08-28 DIAGNOSIS — Z86711 Personal history of pulmonary embolism: Secondary | ICD-10-CM | POA: Insufficient documentation

## 2019-08-28 DIAGNOSIS — R110 Nausea: Secondary | ICD-10-CM | POA: Diagnosis not present

## 2019-08-28 DIAGNOSIS — E538 Deficiency of other specified B group vitamins: Secondary | ICD-10-CM

## 2019-08-28 DIAGNOSIS — D500 Iron deficiency anemia secondary to blood loss (chronic): Secondary | ICD-10-CM

## 2019-08-28 DIAGNOSIS — Z7901 Long term (current) use of anticoagulants: Secondary | ICD-10-CM | POA: Insufficient documentation

## 2019-08-28 DIAGNOSIS — N920 Excessive and frequent menstruation with regular cycle: Secondary | ICD-10-CM | POA: Diagnosis not present

## 2019-08-28 DIAGNOSIS — R5383 Other fatigue: Secondary | ICD-10-CM | POA: Diagnosis not present

## 2019-08-28 LAB — RETIC PANEL
Immature Retic Fract: 7.5 % (ref 2.3–15.9)
RBC.: 4.23 MIL/uL (ref 3.87–5.11)
Retic Count, Absolute: 52.9 10*3/uL (ref 19.0–186.0)
Retic Ct Pct: 1.3 % (ref 0.4–3.1)
Reticulocyte Hemoglobin: 35.9 pg (ref >27.9)

## 2019-08-28 LAB — CBC WITH DIFFERENTIAL (CANCER CENTER ONLY)
Abs Immature Granulocytes: 0.01 10*3/uL (ref 0.00–0.07)
Basophils Absolute: 0 10*3/uL (ref 0.0–0.1)
Basophils Relative: 0 %
Eosinophils Absolute: 0.1 10*3/uL (ref 0.0–0.5)
Eosinophils Relative: 2 %
HCT: 37.2 % (ref 36.0–46.0)
Hemoglobin: 11.9 g/dL — ABNORMAL LOW (ref 12.0–15.0)
Immature Granulocytes: 0 %
Lymphocytes Relative: 28 %
Lymphs Abs: 1.3 10*3/uL (ref 0.7–4.0)
MCH: 28 pg (ref 26.0–34.0)
MCHC: 32 g/dL (ref 30.0–36.0)
MCV: 87.5 fL (ref 80.0–100.0)
Monocytes Absolute: 0.4 10*3/uL (ref 0.1–1.0)
Monocytes Relative: 9 %
Neutro Abs: 2.7 10*3/uL (ref 1.7–7.7)
Neutrophils Relative %: 61 %
Platelet Count: 216 10*3/uL (ref 150–400)
RBC: 4.25 MIL/uL (ref 3.87–5.11)
RDW: 17.9 % — ABNORMAL HIGH (ref 11.5–15.5)
WBC Count: 4.5 10*3/uL (ref 4.0–10.5)
nRBC: 0 % (ref 0.0–0.2)

## 2019-08-28 LAB — IRON AND TIBC
Iron: 76 ug/dL (ref 41–142)
Saturation Ratios: 33 % (ref 21–57)
TIBC: 229 ug/dL — ABNORMAL LOW (ref 236–444)
UIBC: 153 ug/dL (ref 120–384)

## 2019-08-28 LAB — FERRITIN: Ferritin: 86 ng/mL (ref 11–307)

## 2019-08-28 MED ORDER — CYANOCOBALAMIN 1000 MCG/ML IJ SOLN
1000.0000 ug | Freq: Once | INTRAMUSCULAR | Status: AC
  Administered 2019-08-28: 1000 ug via INTRAMUSCULAR

## 2019-08-28 NOTE — Patient Instructions (Signed)

## 2019-08-31 ENCOUNTER — Telehealth: Payer: Self-pay | Admitting: Hematology

## 2019-08-31 NOTE — Telephone Encounter (Signed)
Scheduled appt per 5/21 sch msg. Pt aware of appt date and time

## 2019-09-02 ENCOUNTER — Inpatient Hospital Stay: Payer: 59

## 2019-09-02 ENCOUNTER — Other Ambulatory Visit: Payer: Self-pay

## 2019-09-02 VITALS — BP 139/79 | HR 67 | Temp 98.3°F | Resp 18

## 2019-09-02 DIAGNOSIS — D500 Iron deficiency anemia secondary to blood loss (chronic): Secondary | ICD-10-CM

## 2019-09-02 DIAGNOSIS — Z79899 Other long term (current) drug therapy: Secondary | ICD-10-CM | POA: Diagnosis not present

## 2019-09-02 DIAGNOSIS — R232 Flushing: Secondary | ICD-10-CM | POA: Diagnosis not present

## 2019-09-02 DIAGNOSIS — R110 Nausea: Secondary | ICD-10-CM | POA: Diagnosis not present

## 2019-09-02 DIAGNOSIS — R5383 Other fatigue: Secondary | ICD-10-CM | POA: Diagnosis not present

## 2019-09-02 DIAGNOSIS — N920 Excessive and frequent menstruation with regular cycle: Secondary | ICD-10-CM | POA: Diagnosis not present

## 2019-09-02 DIAGNOSIS — Z7901 Long term (current) use of anticoagulants: Secondary | ICD-10-CM | POA: Diagnosis not present

## 2019-09-02 DIAGNOSIS — E538 Deficiency of other specified B group vitamins: Secondary | ICD-10-CM | POA: Diagnosis not present

## 2019-09-02 DIAGNOSIS — R519 Headache, unspecified: Secondary | ICD-10-CM | POA: Diagnosis not present

## 2019-09-02 MED ORDER — SODIUM CHLORIDE 0.9 % IV SOLN
400.0000 mg | Freq: Once | INTRAVENOUS | Status: AC
  Administered 2019-09-02: 400 mg via INTRAVENOUS
  Filled 2019-09-02: qty 20

## 2019-09-02 MED ORDER — SODIUM CHLORIDE 0.9 % IV SOLN
Freq: Once | INTRAVENOUS | Status: AC
  Filled 2019-09-02: qty 250

## 2019-09-02 MED ORDER — LORATADINE 10 MG PO TABS
10.0000 mg | ORAL_TABLET | Freq: Once | ORAL | Status: AC
  Administered 2019-09-02: 10 mg via ORAL

## 2019-09-02 MED ORDER — LORATADINE 10 MG PO TABS
ORAL_TABLET | ORAL | Status: AC
  Filled 2019-09-02: qty 1

## 2019-09-02 NOTE — Patient Instructions (Signed)

## 2019-09-02 NOTE — Progress Notes (Signed)
Patient declined to stay for 30 minute post observation period. VSS upon leaving unit.  

## 2019-09-10 ENCOUNTER — Other Ambulatory Visit: Payer: Self-pay | Admitting: Family Medicine

## 2019-09-10 MED FILL — ELIQUIS 5 MG TABLET: 5 | 30 days supply | Qty: 60 | Fill #0

## 2019-09-10 NOTE — Telephone Encounter (Signed)
Not on pt current med list/thx dmf

## 2019-09-16 ENCOUNTER — Ambulatory Visit: Payer: 59 | Admitting: Family Medicine

## 2019-10-02 ENCOUNTER — Inpatient Hospital Stay: Payer: 59 | Attending: Internal Medicine

## 2019-10-06 ENCOUNTER — Other Ambulatory Visit: Payer: Self-pay | Admitting: Hematology

## 2019-10-06 ENCOUNTER — Other Ambulatory Visit: Payer: Self-pay | Admitting: Family Medicine

## 2019-10-06 MED FILL — LEVOTHYROXINE SODIUM 25 MCG: 25 | 90 days supply | Qty: 90 | Fill #0

## 2019-10-06 MED FILL — ELIQUIS 5 MG TABLET: 5 | 30 days supply | Qty: 60 | Fill #1

## 2019-10-14 ENCOUNTER — Ambulatory Visit: Payer: 59 | Admitting: Family Medicine

## 2019-10-14 ENCOUNTER — Telehealth: Payer: Self-pay | Admitting: Hematology

## 2019-10-14 DIAGNOSIS — Z1283 Encounter for screening for malignant neoplasm of skin: Secondary | ICD-10-CM | POA: Diagnosis not present

## 2019-10-14 DIAGNOSIS — Z08 Encounter for follow-up examination after completed treatment for malignant neoplasm: Secondary | ICD-10-CM | POA: Diagnosis not present

## 2019-10-14 DIAGNOSIS — Z85820 Personal history of malignant melanoma of skin: Secondary | ICD-10-CM | POA: Diagnosis not present

## 2019-10-14 DIAGNOSIS — L821 Other seborrheic keratosis: Secondary | ICD-10-CM | POA: Diagnosis not present

## 2019-10-14 DIAGNOSIS — X32XXXD Exposure to sunlight, subsequent encounter: Secondary | ICD-10-CM | POA: Diagnosis not present

## 2019-10-14 NOTE — Telephone Encounter (Signed)
R/s appt on 11/26 provider on PAL. Unable to reach pt. Left voicemail with appt time and date.

## 2019-10-14 NOTE — Progress Notes (Deleted)
Rhonda Ford is a 49 y.o. female  No chief complaint on file.   HPI: Rhonda Ford is a 49 y.o. female who complains of     Past Medical History:  Diagnosis Date  . B12 deficiency   . Iron deficiency anemia due to chronic blood loss   . Melanoma ([place])   . Migraines   . Pulmonary emboli ([place]) 01/2018  . Skin cancer     Past Surgical History:  Procedure Laterality Date  . gyn surgeries    . melanoma surgery      leg    Social History   Socioeconomic History  . Marital status: Divorced    Spouse name: Not on file  . Number of children: 5  . Years of education: Not on file  . Highest education level: Not on file  Occupational History  . Occupation: nurse   Tobacco Use  . Smoking status: Never Smoker  . Smokeless tobacco: Never Used  Vaping Use  . Vaping Use: Never used  Substance and Sexual Activity  . Alcohol use: Not Currently  . Drug use: Not Currently  . Sexual activity: Not on file  Other Topics Concern  . Not on file  Social History Narrative  . Not on file   Social Determinants of Health   Financial Resource Strain:   . Difficulty of Paying Living Expenses:   Food Insecurity:   . Worried About Charity fundraiser in the Last Year:   . Arboriculturist in the Last Year:   Transportation Needs:   . Film/video editor (Medical):   [name] Lack of Transportation (Non-Medical):   Physical Activity:   . Days of Exercise per Week:   . Minutes of Exercise per Session:   Stress:   . Feeling of Stress :   Social Connections:   . Frequency of Communication with Friends and Family:   . Frequency of Social Gatherings with Friends and Family:   . Attends Religious Services:   . Active Member of Clubs or Organizations:   . Attends Archivist Meetings:   [name] Marital Status:   Intimate Partner Violence:   . Fear of Current or Ex-Partner:   . Emotionally Abused:   [name] Physically Abused:   . Sexually Abused:     Family History    Problem Relation Age of Onset  . Cancer Maternal Aunt 60       breast cancer and NHL  . Cancer Maternal Grandfather        bone cancer  . Thrombosis Neg Hx       There is no immunization history on file for this patient.  Outpatient Encounter Medications as of 10/14/2019  Medication Sig  . amoxicillin (AMOXIL) 875 MG tablet Take 1 tablet (875 mg total) by mouth 2 (two) times daily.  [name] apixaban (ELIQUIS) 5 MG TABS tablet Take 1 tablet (5 mg total) by mouth 2 (two) times daily.  . Ferrous Sulfate (SLOW FE PO) Take by mouth.  . levothyroxine (SYNTHROID) 25 MCG tablet Take 1 tablet (25 mcg total) by mouth daily before breakfast.   No facility-administered encounter medications on file as of 10/14/2019.     ROS: Pertinent positives and negatives noted in HPI. Remainder of ROS non-contributory    No Known Allergies  There were no vitals taken for this visit.  Physical Exam   A/P:    This visit occurred during the SARS-CoV-2 public health emergency.  Safety protocols  were in place, including screening questions prior to the visit, additional usage of staff PPE, and extensive cleaning of exam room while observing appropriate contact time as indicated for disinfecting solutions.

## 2019-10-28 MED FILL — FLUOROURACIL 5 % CREA: 5 | 14 days supply | Qty: 40 | Fill #0

## 2019-10-30 ENCOUNTER — Inpatient Hospital Stay: Payer: 59 | Attending: Internal Medicine

## 2019-11-06 ENCOUNTER — Telehealth: Payer: Self-pay | Admitting: Family Medicine

## 2019-11-06 ENCOUNTER — Encounter: Payer: Self-pay | Admitting: Family Medicine

## 2019-11-06 NOTE — Telephone Encounter (Signed)
E Thank you

## 2019-11-06 NOTE — Telephone Encounter (Signed)
Pt was no show for appt 10/14/19. First occurrence. Fee waived. Letter mailed.  PCP,  Please reply back with corresponding letter matching appropriate follow up needs.  A - No follow up necessary B - Follow up urgent - locate patient immediately to schedule appointment. C - Follow up necessary. Contact patient and schedule visit w/in 7 days. D - Follow up necessary. Contact patient and schedule visit w/in 2-4 weeks.  E - Follow up necessary. Contact patient and schedule visit w/in 3 months.

## 2019-11-09 NOTE — Telephone Encounter (Signed)
Please contact pt to schedule.   

## 2019-11-10 ENCOUNTER — Other Ambulatory Visit: Payer: Self-pay

## 2019-11-11 ENCOUNTER — Encounter: Payer: Self-pay | Admitting: Family Medicine

## 2019-11-11 ENCOUNTER — Ambulatory Visit: Payer: 59 | Admitting: Family Medicine

## 2019-11-11 VITALS — BP 140/92 | HR 78 | Temp 98.2°F | Ht 64.0 in | Wt 196.6 lb

## 2019-11-11 DIAGNOSIS — L237 Allergic contact dermatitis due to plants, except food: Secondary | ICD-10-CM

## 2019-11-11 MED ORDER — PREDNISONE 20 MG PO TABS: ORAL_TABLET | ORAL | 0 refills | Status: DC

## 2019-11-11 MED ORDER — TRIAMCINOLONE ACETONIDE 0.1 % EX CREA: 1.0000 "application " | TOPICAL_CREAM | Freq: Two times a day (BID) | CUTANEOUS | 0 refills | Status: DC

## 2019-11-11 MED FILL — FLUOROURACIL 5 % CREA: 5 | 14 days supply | Qty: 40 | Fill #0

## 2019-11-11 MED FILL — predniSONE 20 MG TABS: 20 | 8 days supply | Qty: 13 | Fill #0

## 2019-11-11 MED FILL — TRIAMCINOLONE 0.1% CREAM: 0.1 | 10 days supply | Qty: 30 | Fill #0

## 2019-11-11 NOTE — Progress Notes (Signed)
Rhonda Ford is a 49 y.o. female  Chief Complaint  Patient presents with  . Acute Visit    Pt c/o posion ivy, starting 3 days ago and it is spreading, back of both legs and thighs, on stomach.      HPI: Rhonda Ford is a 49 y.o. female who complains of itchy red rash on legs, thighs, stomach. Son has poison ivy. Itching is worse at night.  No fever, chills. Pt notes when she scratches, clear fluid leaks out.   Past Medical History:  Diagnosis Date  . B12 deficiency   . Iron deficiency anemia due to chronic blood loss   . Melanoma ([place])   . Migraines   . Pulmonary emboli ([place]) 01/2018  . Skin cancer     Past Surgical History:  Procedure Laterality Date  . gyn surgeries    . melanoma surgery      leg    Social History   Socioeconomic History  . Marital status: Divorced    Spouse name: Not on file  . Number of children: 5  . Years of education: Not on file  . Highest education level: Not on file  Occupational History  . Occupation: nurse   Tobacco Use  . Smoking status: Never Smoker  . Smokeless tobacco: Never Used  Vaping Use  . Vaping Use: Never used  Substance and Sexual Activity  . Alcohol use: Not Currently  . Drug use: Not Currently  . Sexual activity: Not on file  Other Topics Concern  . Not on file  Social History Narrative  . Not on file   Social Determinants of Health   Financial Resource Strain:   . Difficulty of Paying Living Expenses:   Food Insecurity:   . Worried About Charity fundraiser in the Last Year:   . Arboriculturist in the Last Year:   Transportation Needs:   . Film/video editor (Medical):   [name] Lack of Transportation (Non-Medical):   Physical Activity:   . Days of Exercise per Week:   . Minutes of Exercise per Session:   Stress:   . Feeling of Stress :   Social Connections:   . Frequency of Communication with Friends and Family:   . Frequency of Social Gatherings with Friends and Family:   . Attends  Religious Services:   . Active Member of Clubs or Organizations:   . Attends Archivist Meetings:   [name] Marital Status:   Intimate Partner Violence:   . Fear of Current or Ex-Partner:   . Emotionally Abused:   [name] Physically Abused:   . Sexually Abused:     Family History  Problem Relation Age of Onset  . Cancer Maternal Aunt 60       breast cancer and NHL  . Cancer Maternal Grandfather        bone cancer  . Thrombosis Neg Hx      Immunization History  Administered Date(s) Administered  . Tdap 11/01/2019    Outpatient Encounter Medications as of 11/11/2019  Medication Sig  . apixaban (ELIQUIS) 5 MG TABS tablet Take 1 tablet (5 mg total) by mouth 2 (two) times daily.  . Ferrous Sulfate (SLOW FE PO) Take by mouth.  . fluorouracil (EFUDEX) 5 % cream Apply topically 2 (two) times daily.  [name] levothyroxine (SYNTHROID) 25 MCG tablet Take 1 tablet (25 mcg total) by mouth daily before breakfast.  . predniSONE (DELTASONE) 20 MG tablet 3 tabs po daily  x 2 days, 2 tabs po x 2 days, 1 tab po x 2 days, 1/2 tab po x 2 days  . triamcinolone cream (KENALOG) 0.1 % Apply 1 application topically 2 (two) times daily.  . [DISCONTINUED] amoxicillin (AMOXIL) 875 MG tablet Take 1 tablet (875 mg total) by mouth 2 (two) times daily.   No facility-administered encounter medications on file as of 11/11/2019.     ROS: Pertinent positives and negatives noted in HPI. Remainder of ROS non-contributory    No Known Allergies  BP (!) 140/92 (BP Location: Left Arm, Patient Position: Sitting, Cuff Size: Normal)   Pulse 78   Temp 98.2 F (36.8 C) (Temporal)   Ht 5\' 4"  (1.626 m)   Wt 196 lb 9.6 oz (89.2 kg)   LMP  (LMP Unknown)   SpO2 99%   BMI 33.75 kg/m   Physical Exam Constitutional:      General: She is not in acute distress.    Appearance: Normal appearance. She is not toxic-appearing.  Skin:    Findings: Rash present. Rash is papular and vesicular.     Comments: Scattered erythematous  papules and vesicles in linear fashion on lower leg, back of thighs, low and mid abdomen  Neurological:     Mental Status: She is alert and oriented to person, place, and time.  Psychiatric:        Behavior: Behavior normal.      A/P:  1. Contact dermatitis due to poison ivy - ok to use benadryl and/or benadryl cream Rx: - predniSONE (DELTASONE) 20 MG tablet; 3 tabs po daily x 2 days, 2 tabs po x 2 days, 1 tab po x 2 days, 1/2 tab po x 2 days  Dispense: 13 tablet; Refill: 0 - triamcinolone cream (KENALOG) 0.1 %; Apply 1 application topically 2 (two) times daily.  Dispense: 30 g; Refill: 0 - f/u PRN Discussed plan and reviewed medications with patient, including risks, benefits, and potential side effects. Pt expressed understand. All questions answered.   This visit occurred during the SARS-CoV-2 public health emergency.  Safety protocols were in place, including screening questions prior to the visit, additional usage of staff PPE, and extensive cleaning of exam room while observing appropriate contact time as indicated for disinfecting solutions.

## 2019-11-16 MED FILL — ELIQUIS 5 MG TABLET: 5 | 30 days supply | Qty: 60 | Fill #2

## 2019-12-04 ENCOUNTER — Inpatient Hospital Stay: Payer: 59 | Attending: Internal Medicine

## 2020-01-01 ENCOUNTER — Inpatient Hospital Stay: Payer: 59 | Attending: Internal Medicine

## 2020-01-07 ENCOUNTER — Telehealth: Payer: Self-pay | Admitting: Hematology

## 2020-01-07 NOTE — Telephone Encounter (Signed)
Called pt per 9/30 sch msg - no answer. Left message for patient to call back to reschedule apt

## 2020-01-08 MED FILL — ELIQUIS 5 MG TABLET: 5 | 30 days supply | Qty: 60 | Fill #0

## 2020-01-29 ENCOUNTER — Other Ambulatory Visit: Payer: Self-pay

## 2020-01-29 ENCOUNTER — Inpatient Hospital Stay: Payer: 59 | Attending: Internal Medicine

## 2020-01-29 DIAGNOSIS — E538 Deficiency of other specified B group vitamins: Secondary | ICD-10-CM

## 2020-01-29 DIAGNOSIS — D500 Iron deficiency anemia secondary to blood loss (chronic): Secondary | ICD-10-CM | POA: Insufficient documentation

## 2020-01-29 LAB — CBC WITH DIFFERENTIAL (CANCER CENTER ONLY)
Abs Immature Granulocytes: 0.01 10*3/uL (ref 0.00–0.07)
Basophils Absolute: 0 10*3/uL (ref 0.0–0.1)
Basophils Relative: 1 %
Eosinophils Absolute: 0.1 10*3/uL (ref 0.0–0.5)
Eosinophils Relative: 1 %
HCT: 38.1 % (ref 36.0–46.0)
Hemoglobin: 12.3 g/dL (ref 12.0–15.0)
Immature Granulocytes: 0 %
Lymphocytes Relative: 21 %
Lymphs Abs: 1.3 10*3/uL (ref 0.7–4.0)
MCH: 29.6 pg (ref 26.0–34.0)
MCHC: 32.3 g/dL (ref 30.0–36.0)
MCV: 91.8 fL (ref 80.0–100.0)
Monocytes Absolute: 0.5 10*3/uL (ref 0.1–1.0)
Monocytes Relative: 8 %
Neutro Abs: 4 10*3/uL (ref 1.7–7.7)
Neutrophils Relative %: 69 %
Platelet Count: 304 10*3/uL (ref 150–400)
RBC: 4.15 MIL/uL (ref 3.87–5.11)
RDW: 13.2 % (ref 11.5–15.5)
WBC Count: 5.8 10*3/uL (ref 4.0–10.5)
nRBC: 0 % (ref 0.0–0.2)

## 2020-01-29 LAB — RETIC PANEL
Immature Retic Fract: 14.9 % (ref 2.3–15.9)
RBC.: 4.05 MIL/uL (ref 3.87–5.11)
Retic Count, Absolute: 96 10*3/uL (ref 19.0–186.0)
Retic Ct Pct: 2.4 % (ref 0.4–3.1)
Reticulocyte Hemoglobin: 33 pg (ref >27.9)

## 2020-01-29 LAB — FERRITIN: Ferritin: 10 ng/mL — ABNORMAL LOW (ref 11–307)

## 2020-01-29 LAB — IRON AND TIBC
Iron: 86 ug/dL (ref 41–142)
Saturation Ratios: 26 % (ref 21–57)
TIBC: 329 ug/dL (ref 236–444)
UIBC: 242 ug/dL (ref 120–384)

## 2020-01-29 LAB — VITAMIN B12: Vitamin B-12: 187 pg/mL (ref 180–914)

## 2020-02-01 ENCOUNTER — Telehealth: Payer: Self-pay

## 2020-02-01 NOTE — Telephone Encounter (Signed)
Called pt left message to call back and review lab results and new recommendation Awaiting call back

## 2020-02-01 NOTE — Telephone Encounter (Signed)
-----   Message from Truitt Merle, MD sent at 01/29/2020 10:59 PM EDT ----- Please let pt know her iron and B12 levels are all low, she has not been compliant with lab and B12 injection monthly. Please schedule B12 injection weekly X4 and iv venofer 400mg  weeklyX3, if she agrees, thanks   [employer]

## 2020-02-09 ENCOUNTER — Encounter: Payer: Self-pay | Admitting: Hematology

## 2020-02-09 ENCOUNTER — Other Ambulatory Visit: Payer: Self-pay | Admitting: Nurse Practitioner

## 2020-02-09 MED ORDER — APIXABAN 5 MG PO TABS: 5.0000 mg | ORAL_TABLET | Freq: Two times a day (BID) | ORAL | 4 refills | Status: DC

## 2020-02-09 MED FILL — ELIQUIS 5 MG TABLET: 5 | 30 days supply | Qty: 60 | Fill #0

## 2020-02-10 ENCOUNTER — Telehealth: Payer: Self-pay | Admitting: Hematology

## 2020-02-10 ENCOUNTER — Telehealth: Payer: Self-pay

## 2020-02-10 NOTE — Telephone Encounter (Signed)
Called was able to speak with pt about lab results ; iron and B12 low per Dr. Burr Medico B12 injection weekly x4 and IV venofer 400mg  weekly x 3 pt is agreeable   Scheduling message sent

## 2020-02-10 NOTE — Telephone Encounter (Signed)
Called pt per 11/3 sch msg :  Scheduling Message Entered by Raynelle Bring on 02/10/2020 at 8:31 AM Priority: Routine EST PT 30  Department: [facility]  Provider: Truitt Merle, MD  Scheduling Notes:  per Dr. Burr Medico please schedule B12 injection weekly x 4 weeks and IV venofer 400mg  weekly x3 weeks   PLEASE call pt to schedule and confirm appts     Unable to reach pt . Left message for patient to call in to set up appts.

## 2020-02-26 ENCOUNTER — Telehealth: Payer: Self-pay | Admitting: Hematology

## 2020-02-26 NOTE — Telephone Encounter (Signed)
Rescheduled per 11/19 sch msg. Called and spoke with pt confirmed new appt time for 11/29

## 2020-02-26 NOTE — Telephone Encounter (Signed)
Scheduled per 11/19 sch msg. Called pt left a msg about appt time change on 11/22

## 2020-02-29 ENCOUNTER — Ambulatory Visit: Payer: 59

## 2020-02-29 ENCOUNTER — Inpatient Hospital Stay: Payer: 59

## 2020-02-29 NOTE — Progress Notes (Signed)
Rhonda Ford   [phone] [phone]   Clinic Follow up Note   Patient Care Team: Ronnald Nian, DO as PCP - General (Family Medicine)  Date of Service:  03/07/2020  CHIEF COMPLAINT: F/u of PEand iron deficient anemia   CURRENT THERAPY:  -IV Feraheme as neededsince 06/2018. Due to insurance she was switched to IV Venofer on 07/15/19. -oral iron and oral B12 -monthly B12 injection since 06/25/18. Changed to at home injections after 03/07/20.   INTERVAL HISTORY:  Rhonda Ford is here for a follow up of PE and IDA. She was last seen by me 6 months ago. She presents to the clinic alone. She notes she has been more fatigued. She notes she called for her 02/29/20 appointment but was told it was cancelled. Before that she was not able to connect to Mychart and missed those appointments. She now has access and plan to return as scheduled. She notes after her COVID vaccine she has pause in her menstrual cycle and neuropathy in her feet. Since September and October her period returned and very heavy.  She notes she is getting billed for a $8440 when she required visit to Symptom Management and Blood transfusion in 11/21/18 as her insurance said it was not medically necessary and the bill was sent to collections.  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 She notes skin itching of her body and on cream treatment for now.    REVIEW OF SYSTEMS:   Constitutional: Denies fevers, chills or abnormal weight loss Eyes: Denies blurriness of vision Ears, nose, mouth, throat, and face: Denies mucositis or sore throat Respiratory: Denies cough, dyspnea or wheezes Cardiovascular: Denies palpitation, chest discomfort or lower extremity swelling Gastrointestinal:  Denies nausea, heartburn or change in bowel habits Skin: Denies abnormal skin rashes Lymphatics: Denies new lymphadenopathy or easy bruising Neurological:Denies numbness, tingling  or new weaknesses Behavioral/Psych: Mood is stable, no new changes  All other systems were reviewed with the patient and are negative.  MEDICAL HISTORY:  Past Medical History:  Diagnosis Date  . B12 deficiency   . Iron deficiency anemia due to chronic blood loss   . Melanoma ([place])   . Migraines   . Pulmonary emboli ([place]) 01/2018  . Skin cancer     SURGICAL HISTORY: Past Surgical History:  Procedure Laterality Date  . gyn surgeries    . melanoma surgery      leg    I have reviewed the social history and family history with the patient and they are unchanged from previous note.  ALLERGIES:  has No Known Allergies.  MEDICATIONS:  Current Outpatient Medications  Medication Sig Dispense Refill  . apixaban (ELIQUIS) 5 MG TABS tablet Take 1 tablet (5 mg total) by mouth 2 (two) times daily. 60 tablet 4  . cyanocobalamin (,VITAMIN B-12,) 1000 MCG/ML injection Inject 1 mL (1,000 mcg total) into the muscle once for 1 dose. Weekly injection for 7 weeks then once every month 4 mL 2  . Ferrous Sulfate (SLOW FE PO) Take by mouth.    . fluorouracil (EFUDEX) 5 % cream Apply topically 2 (two) times daily.    [name] levothyroxine (SYNTHROID) 25 MCG tablet Take 1 tablet (25 mcg total) by mouth daily before breakfast. 90 tablet 3  . predniSONE (DELTASONE) 20 MG tablet 3 tabs po daily x 2 days, 2 tabs po x 2 days, 1 tab po x 2 days, 1/2 tab po x 2 days 13 tablet 0  .  triamcinolone cream (KENALOG) 0.1 % Apply 1 application topically 2 (two) times daily. 30 g 0   No current facility-administered medications for this visit.   Facility-Administered Medications Ordered in Other Visits  Medication Dose Route Frequency Provider Last Rate Last Admin  . 0.9 %  sodium chloride infusion   Intravenous Once Truitt Merle, MD      . cyanocobalamin ((VITAMIN B-12)) injection 1,000 mcg  1,000 mcg Intramuscular Once Truitt Merle, MD      . iron sucrose (VENOFER) 400 mg in sodium chloride 0.9 % 250 mL IVPB  400 mg  Intravenous Once Truitt Merle, MD      . loratadine (CLARITIN) tablet 10 mg  10 mg Oral Once Truitt Merle, MD        PHYSICAL EXAMINATION: ECOG PERFORMANCE STATUS: 1 - Symptomatic but completely ambulatory  Vitals:   03/07/20 0938  BP: (!) 141/82  Pulse: 81  Resp: 15  Temp: 97.6 F (36.4 C)  SpO2: 100%   Filed Weights   03/07/20 0938  Weight: 197 lb 12.8 oz (89.7 kg)    Due to COVID19 we will limit examination to appearance. Patient had no complaints.  GENERAL:alert, no distress and comfortable SKIN: skin color normal, no rashes or significant lesions EYES: normal, Conjunctiva are pink and non-injected, sclera clear  NEURO: alert & oriented x 3 with fluent speech   LABORATORY DATA:  I have reviewed the data as listed CBC Latest Ref Rng & Units 03/07/2020 01/29/2020 08/28/2019  WBC 4.0 - 10.5 K/uL 3.6(L) 5.8 4.5  Hemoglobin 12.0 - 15.0 g/dL 8.3(L) 12.3 11.9(L)  Hematocrit 36 - 46 % 27.5(L) 38.1 37.2  Platelets 150 - 400 K/uL 232 304 216     CMP Latest Ref Rng & Units 11/21/2018 08/08/2018 06/19/2018  Glucose 70 - 99 mg/dL 99 99 89  BUN 6 - 20 mg/dL 9 18 20   Creatinine 0.44 - 1.00 mg/dL 0.67 0.72 0.83  Sodium 135 - 145 mmol/L 140 139 139  Potassium 3.5 - 5.1 mmol/L 3.7 3.8 4.2  Chloride 98 - 111 mmol/L 107 107 107  CO2 22 - 32 mmol/L 24 24 22   Calcium 8.9 - 10.3 mg/dL 8.5(L) 8.5(L) 8.8(L)  Total Protein 6.5 - 8.1 g/dL 6.1(L) 6.8 7.4  Total Bilirubin 0.3 - 1.2 mg/dL 0.2(L) 0.3 0.5  Alkaline Phos 38 - 126 U/L 69 75 82  AST 15 - 41 U/L 14(L) 11(L) 15  ALT 0 - 44 U/L 15 15 14       RADIOGRAPHIC STUDIES: I have personally reviewed the radiological images as listed and agreed with the findings in the report. No results found.   ASSESSMENT & PLAN:  Rhonda Ford is a 49 y.o. female with    1. Iron deficiency anemia,B12 deficiency -Likely secondary to menorrhagia.Unclear the etiology about her B12 deficiency, her intrinsic factor was negative -Per patient she  has been anemic ranging from 7.5-10 since she was an adolescent with chronic fatigue. This had not been worked up until her PE in late 2019. -Workup done by Dr. Walden Field in 06/2018 is consistent with iron deficiency anemia.  -Her 01/2018 colonoscopy and EGD which was normal except stomach ulcer, no H. Pylorie. Shehas recently stoppedPPI -She has had long history of menorrhagia which is the likely cause of her anemia. Since 04/2018 her periods have been irregular and occasionally lighter. She feels she may be perimenopausal. -She was on IV Feraheme as needed since 06/2018. Due to insurance she was switched to IV Venofer on  07/15/19. Last dose given 09/02/19.  -She also has B12 deficiencybased on lab in early 2020. She has had no gastric or GI surgeries.She is currently on monthly B12 injectionssince 3/ 020but she missed many injections, she is also on oral B12.  -She has missed appointments since May 2021. She notes a few month without a period, but has returned with significant menorrhagia in 12/2019. She is now more fatigued. Labs today show WBC 3.6, Hg 8.3, immature 24.4. Ferritin and iron still pending but likely low.  -Will proceed with IV Venofer 400mg  weekly for [redacted] weeks along with B12 weekly for 8 weeks starting today. Will change to at home B12 injections after today.  -Monitor with monthly labs. After 8 weeks, return to B12 injection monthly, oral B12 and Oral iron. Continue IV Venofer as needed with goal ferritin above 100 -F/u in 6 months   2.H/oPulmonary Emboli, Unprovoked -Occurred in10/2019.No PE seen on 07/03/18 Angio CT chest. -She has been treated with Eliquis since3/2020. Given this was significant, unprovokedPEand no family history of PE, it has been recommended tocontinue indefinitely. -Shepreviouslyhadanother bout of menorrhagia but did not stop Eliquis given her concern for another PE.  -Given anemia from menorrhagia, I recommend she hold Eliquis for a few days during  her period. She is agreeable.  3. Melanomaand Squamous cell carcinoma.  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 She is followed by dermatologywho she saw when she lived in [place].  -She has recent recurrence of squamous cell (2021). She is currently being treated with topical cream by Dermatologist.   4. Financial assistance  -Pt notes she has been billed for a $8440 when she required visit to Symptom Management and Blood transfusion in 11/21/18. Her insurance initially approved and paid for this but reversed it as her insurance said it was not medically necessary. Her bill is now in collections under her name.  -I recommend she consult with Financial advocate Lenise White first   PLAN: -Proceed with Venofer and B12 injection today  -I called in B12 home injections (weekly for 7 weeks then change to monthly) -Continue oral B12 and oral iron  -IV Venofer 400mg  in 1 and 2 weeks -Lab monthly X6, with iv Venofer if ferritin<100.   -F/u in 6 months   No problem-specific Assessment & Plan notes found for this encounter.   No orders of the defined types were placed in this encounter.  All questions were answered. The patient knows to call the clinic with any problems, questions or concerns. No barriers to learning was detected. The total time spent in the appointment was 30 minutes.     Truitt Merle, MD 03/07/2020   I, Joslyn Devon, am acting as scribe for Truitt Merle, MD.   I have reviewed the above documentation for accuracy and completeness, and I agree with the above.

## 2020-03-04 ENCOUNTER — Ambulatory Visit: Payer: 59 | Admitting: Hematology

## 2020-03-04 ENCOUNTER — Other Ambulatory Visit: Payer: 59

## 2020-03-07 ENCOUNTER — Inpatient Hospital Stay: Payer: 59

## 2020-03-07 ENCOUNTER — Other Ambulatory Visit: Payer: Self-pay | Admitting: Hematology

## 2020-03-07 ENCOUNTER — Telehealth: Payer: Self-pay | Admitting: Hematology

## 2020-03-07 ENCOUNTER — Other Ambulatory Visit: Payer: Self-pay

## 2020-03-07 ENCOUNTER — Encounter: Payer: Self-pay | Admitting: Hematology

## 2020-03-07 ENCOUNTER — Inpatient Hospital Stay: Payer: 59 | Attending: Internal Medicine

## 2020-03-07 ENCOUNTER — Inpatient Hospital Stay (HOSPITAL_BASED_OUTPATIENT_CLINIC_OR_DEPARTMENT_OTHER): Payer: 59 | Admitting: Hematology

## 2020-03-07 VITALS — BP 140/85 | HR 82 | Temp 97.7°F | Resp 17

## 2020-03-07 VITALS — BP 141/82 | HR 81 | Temp 97.6°F | Resp 15 | Ht 64.0 in | Wt 197.8 lb

## 2020-03-07 DIAGNOSIS — D500 Iron deficiency anemia secondary to blood loss (chronic): Secondary | ICD-10-CM

## 2020-03-07 DIAGNOSIS — D509 Iron deficiency anemia, unspecified: Secondary | ICD-10-CM | POA: Diagnosis not present

## 2020-03-07 DIAGNOSIS — Z86711 Personal history of pulmonary embolism: Secondary | ICD-10-CM | POA: Insufficient documentation

## 2020-03-07 DIAGNOSIS — E538 Deficiency of other specified B group vitamins: Secondary | ICD-10-CM | POA: Diagnosis not present

## 2020-03-07 DIAGNOSIS — Z85820 Personal history of malignant melanoma of skin: Secondary | ICD-10-CM | POA: Diagnosis not present

## 2020-03-07 LAB — CBC WITH DIFFERENTIAL (CANCER CENTER ONLY)
Abs Immature Granulocytes: 0.01 10*3/uL (ref 0.00–0.07)
Basophils Absolute: 0 10*3/uL (ref 0.0–0.1)
Basophils Relative: 1 %
Eosinophils Absolute: 0.1 10*3/uL (ref 0.0–0.5)
Eosinophils Relative: 2 %
HCT: 27.5 % — ABNORMAL LOW (ref 36.0–46.0)
Hemoglobin: 8.3 g/dL — ABNORMAL LOW (ref 12.0–15.0)
Immature Granulocytes: 0 %
Lymphocytes Relative: 25 %
Lymphs Abs: 0.9 10*3/uL (ref 0.7–4.0)
MCH: 26 pg (ref 26.0–34.0)
MCHC: 30.2 g/dL (ref 30.0–36.0)
MCV: 86.2 fL (ref 80.0–100.0)
Monocytes Absolute: 0.3 10*3/uL (ref 0.1–1.0)
Monocytes Relative: 9 %
Neutro Abs: 2.3 10*3/uL (ref 1.7–7.7)
Neutrophils Relative %: 63 %
Platelet Count: 232 10*3/uL (ref 150–400)
RBC: 3.19 MIL/uL — ABNORMAL LOW (ref 3.87–5.11)
RDW: 15.1 % (ref 11.5–15.5)
WBC Count: 3.6 10*3/uL — ABNORMAL LOW (ref 4.0–10.5)
nRBC: 0 % (ref 0.0–0.2)

## 2020-03-07 LAB — IRON AND TIBC
Iron: 19 ug/dL — ABNORMAL LOW (ref 41–142)
Saturation Ratios: 6 % — ABNORMAL LOW (ref 21–57)
TIBC: 329 ug/dL (ref 236–444)
UIBC: 310 ug/dL (ref 120–384)

## 2020-03-07 LAB — RETIC PANEL
Immature Retic Fract: 24.4 % — ABNORMAL HIGH (ref 2.3–15.9)
RBC.: 3.17 MIL/uL — ABNORMAL LOW (ref 3.87–5.11)
Retic Count, Absolute: 96.4 10*3/uL (ref 19.0–186.0)
Retic Ct Pct: 3 % (ref 0.4–3.1)
Reticulocyte Hemoglobin: 20.3 pg — ABNORMAL LOW (ref >27.9)

## 2020-03-07 LAB — FERRITIN: Ferritin: <4 ng/mL — ABNORMAL LOW (ref 11–307)

## 2020-03-07 MED ORDER — LORATADINE 10 MG PO TABS
ORAL_TABLET | ORAL | Status: AC
  Filled 2020-03-07: qty 1

## 2020-03-07 MED ORDER — CYANOCOBALAMIN 1000 MCG/ML IJ SOLN
1000.0000 ug | Freq: Once | INTRAMUSCULAR | Status: AC
  Administered 2020-03-07: 1000 ug via INTRAMUSCULAR

## 2020-03-07 MED ORDER — CYANOCOBALAMIN 1000 MCG/ML IJ SOLN: 1000.0000 ug | Freq: Once | INTRAMUSCULAR | 2 refills | Status: DC

## 2020-03-07 MED ORDER — SODIUM CHLORIDE 0.9 % IV SOLN
400.0000 mg | Freq: Once | INTRAVENOUS | Status: AC
  Administered 2020-03-07: 400 mg via INTRAVENOUS
  Filled 2020-03-07: qty 20

## 2020-03-07 MED ORDER — SODIUM CHLORIDE 0.9 % IV SOLN
Freq: Once | INTRAVENOUS | Status: AC
  Filled 2020-03-07: qty 250

## 2020-03-07 MED ORDER — LORATADINE 10 MG PO TABS
10.0000 mg | ORAL_TABLET | Freq: Once | ORAL | Status: AC
  Administered 2020-03-07: 10 mg via ORAL

## 2020-03-07 MED ORDER — CYANOCOBALAMIN 1000 MCG/ML IJ SOLN
INTRAMUSCULAR | Status: AC
  Filled 2020-03-07: qty 1

## 2020-03-07 MED FILL — CYANOCOBALAMIN 1,000 MCG/ML: 1000 | 28 days supply | Qty: 4 | Fill #0

## 2020-03-07 NOTE — Telephone Encounter (Signed)
Scheduled per los. Patient declined printout  

## 2020-03-07 NOTE — Patient Instructions (Signed)

## 2020-03-07 NOTE — Progress Notes (Signed)
Patient did not wish to stay for 30 minute post-infusion wait. Patient was observed to be ambulatory with no complaints and vitals signs WDL.

## 2020-03-10 ENCOUNTER — Encounter: Payer: Self-pay | Admitting: Hematology

## 2020-03-11 ENCOUNTER — Ambulatory Visit: Payer: 59 | Admitting: Family Medicine

## 2020-03-14 ENCOUNTER — Other Ambulatory Visit: Payer: Self-pay

## 2020-03-14 ENCOUNTER — Inpatient Hospital Stay: Payer: 59 | Attending: Internal Medicine

## 2020-03-14 VITALS — BP 149/89 | HR 73 | Temp 98.2°F | Resp 18

## 2020-03-14 DIAGNOSIS — D500 Iron deficiency anemia secondary to blood loss (chronic): Secondary | ICD-10-CM

## 2020-03-14 DIAGNOSIS — D509 Iron deficiency anemia, unspecified: Secondary | ICD-10-CM | POA: Diagnosis not present

## 2020-03-14 DIAGNOSIS — E538 Deficiency of other specified B group vitamins: Secondary | ICD-10-CM | POA: Insufficient documentation

## 2020-03-14 MED ORDER — LORATADINE 10 MG PO TABS
ORAL_TABLET | ORAL | Status: AC
  Filled 2020-03-14: qty 1

## 2020-03-14 MED ORDER — SODIUM CHLORIDE 0.9 % IV SOLN
Freq: Once | INTRAVENOUS | Status: AC
  Filled 2020-03-14: qty 250

## 2020-03-14 MED ORDER — SODIUM CHLORIDE 0.9 % IV SOLN
400.0000 mg | Freq: Once | INTRAVENOUS | Status: AC
  Administered 2020-03-14: 400 mg via INTRAVENOUS
  Filled 2020-03-14: qty 20

## 2020-03-14 MED ORDER — LORATADINE 10 MG PO TABS
10.0000 mg | ORAL_TABLET | Freq: Once | ORAL | Status: AC
  Administered 2020-03-14: 10 mg via ORAL

## 2020-03-14 NOTE — Patient Instructions (Signed)

## 2020-03-14 NOTE — Progress Notes (Signed)
Patient discharged in stable condition.

## 2020-03-21 ENCOUNTER — Inpatient Hospital Stay: Payer: 59

## 2020-03-21 ENCOUNTER — Encounter: Payer: Self-pay | Admitting: Hematology

## 2020-03-21 ENCOUNTER — Other Ambulatory Visit: Payer: Self-pay

## 2020-03-21 VITALS — BP 133/77 | HR 73 | Temp 97.8°F | Resp 16

## 2020-03-21 DIAGNOSIS — D509 Iron deficiency anemia, unspecified: Secondary | ICD-10-CM | POA: Diagnosis not present

## 2020-03-21 DIAGNOSIS — E538 Deficiency of other specified B group vitamins: Secondary | ICD-10-CM | POA: Diagnosis not present

## 2020-03-21 DIAGNOSIS — D500 Iron deficiency anemia secondary to blood loss (chronic): Secondary | ICD-10-CM

## 2020-03-21 MED ORDER — SODIUM CHLORIDE 0.9 % IV SOLN
400.0000 mg | Freq: Once | INTRAVENOUS | Status: AC
  Administered 2020-03-21: 09:00:00 400 mg via INTRAVENOUS
  Filled 2020-03-21: qty 20

## 2020-03-21 MED ORDER — SODIUM CHLORIDE 0.9 % IV SOLN
INTRAVENOUS | Status: DC
  Filled 2020-03-21: qty 250

## 2020-03-21 NOTE — Progress Notes (Signed)
Pt tolerated treatment well.  VSS.  No complaints.  Refused to wait 30 minutes post observation.  Stable at discharge.  Ambulatory to lobby

## 2020-03-21 NOTE — Patient Instructions (Signed)

## 2020-03-21 NOTE — Progress Notes (Signed)
Patient called regarding insurance denial/appeal.  Staff message sent to coding, auth , denial teams as well as RN and physician for follow up.

## 2020-03-23 ENCOUNTER — Telehealth: Payer: Self-pay | Admitting: Family Medicine

## 2020-03-23 NOTE — Telephone Encounter (Signed)
Called patient to reschedule her appointment on 12/16 due to Dr. Bryan Lemma being out of the office. Informed patient that we had availability with our Nurse Practitioner Dutch Quint the same day and could get her in with her, but patient was unsure if she would be able to get off of work and would call me back. Called patient again on 12/15, but was unable to reach her.

## 2020-03-23 NOTE — Telephone Encounter (Signed)
[  9:56 AM] Rhonda Ford called patient and she is at work today and is unable to come in today and advised me that she has to work tomorrow as well so may have to cancel that appt but will call back later today to let us know.   Dm/cma

## 2020-03-24 ENCOUNTER — Ambulatory Visit: Payer: 59 | Admitting: Family Medicine

## 2020-03-24 DIAGNOSIS — Z0289 Encounter for other administrative examinations: Secondary | ICD-10-CM

## 2020-04-04 ENCOUNTER — Inpatient Hospital Stay: Payer: 59

## 2020-04-04 ENCOUNTER — Other Ambulatory Visit: Payer: Self-pay

## 2020-04-04 DIAGNOSIS — E538 Deficiency of other specified B group vitamins: Secondary | ICD-10-CM | POA: Diagnosis not present

## 2020-04-04 DIAGNOSIS — D509 Iron deficiency anemia, unspecified: Secondary | ICD-10-CM | POA: Diagnosis not present

## 2020-04-04 DIAGNOSIS — D500 Iron deficiency anemia secondary to blood loss (chronic): Secondary | ICD-10-CM

## 2020-04-04 LAB — CBC WITH DIFFERENTIAL (CANCER CENTER ONLY)
Abs Immature Granulocytes: 0.01 10*3/uL (ref 0.00–0.07)
Basophils Absolute: 0 10*3/uL (ref 0.0–0.1)
Basophils Relative: 1 %
Eosinophils Absolute: 0.1 10*3/uL (ref 0.0–0.5)
Eosinophils Relative: 2 %
HCT: 37.4 % (ref 36.0–46.0)
Hemoglobin: 11.6 g/dL — ABNORMAL LOW (ref 12.0–15.0)
Immature Granulocytes: 0 %
Lymphocytes Relative: 25 %
Lymphs Abs: 1.3 10*3/uL (ref 0.7–4.0)
MCH: 27.4 pg (ref 26.0–34.0)
MCHC: 31 g/dL (ref 30.0–36.0)
MCV: 88.4 fL (ref 80.0–100.0)
Monocytes Absolute: 0.4 10*3/uL (ref 0.1–1.0)
Monocytes Relative: 8 %
Neutro Abs: 3.2 10*3/uL (ref 1.7–7.7)
Neutrophils Relative %: 64 %
Platelet Count: 239 10*3/uL (ref 150–400)
RBC: 4.23 MIL/uL (ref 3.87–5.11)
RDW: 18.8 % — ABNORMAL HIGH (ref 11.5–15.5)
WBC Count: 5 10*3/uL (ref 4.0–10.5)
nRBC: 0 % (ref 0.0–0.2)

## 2020-04-04 LAB — RETIC PANEL
Immature Retic Fract: 16.7 % — ABNORMAL HIGH (ref 2.3–15.9)
RBC.: 4.21 MIL/uL (ref 3.87–5.11)
Retic Count, Absolute: 71.6 10*3/uL (ref 19.0–186.0)
Retic Ct Pct: 1.7 % (ref 0.4–3.1)
Reticulocyte Hemoglobin: 36.3 pg (ref >27.9)

## 2020-04-04 LAB — IRON AND TIBC
Iron: 49 ug/dL (ref 41–142)
Saturation Ratios: 18 % — ABNORMAL LOW (ref 21–57)
TIBC: 266 ug/dL (ref 236–444)
UIBC: 217 ug/dL (ref 120–384)

## 2020-04-04 LAB — FERRITIN: Ferritin: 97 ng/mL (ref 11–307)

## 2020-04-12 MED FILL — LEVOTHYROXINE SODIUM 25 MCG: 25 | 90 days supply | Qty: 90 | Fill #1

## 2020-04-12 MED FILL — ELIQUIS 5 MG TABLET: 5 | 30 days supply | Qty: 60 | Fill #1

## 2020-04-16 ENCOUNTER — Encounter: Payer: Self-pay | Admitting: Family Medicine

## 2020-04-16 ENCOUNTER — Encounter: Payer: Self-pay | Admitting: Hematology

## 2020-04-16 ENCOUNTER — Telehealth: Payer: Self-pay | Admitting: Family Medicine

## 2020-04-16 NOTE — Telephone Encounter (Signed)
Pt was no show for appt 03/24/2020 for acute visit. 2nd occurrence. 10/14/2019 & 03/24/2020 Letter mailed.  PCP,  Please reply back with corresponding letter matching appropriate follow up needs.  A - No follow up necessary B - Follow up urgent - locate patient immediately to schedule appointment. C - Follow up necessary. Contact patient and schedule visit w/in 7 days. D - Follow up necessary. Contact patient and schedule visit w/in 2-4 weeks.  E - Follow up necessary. Contact patient and schedule visit w/in 3 months.

## 2020-04-18 NOTE — Telephone Encounter (Signed)
A 

## 2020-05-06 ENCOUNTER — Other Ambulatory Visit: Payer: 59

## 2020-05-06 ENCOUNTER — Telehealth: Payer: Self-pay | Admitting: Hematology

## 2020-05-06 NOTE — Telephone Encounter (Signed)
Returned call to reschedule lab appointment. Left message to call back to reschedule. Cancelled appointment.

## 2020-05-30 MED FILL — ELIQUIS 5 MG TABLET: 5 | 30 days supply | Qty: 60 | Fill #2

## 2020-06-03 ENCOUNTER — Other Ambulatory Visit: Payer: Self-pay

## 2020-06-03 ENCOUNTER — Inpatient Hospital Stay: Payer: 59 | Attending: Internal Medicine

## 2020-06-03 DIAGNOSIS — E538 Deficiency of other specified B group vitamins: Secondary | ICD-10-CM

## 2020-06-03 DIAGNOSIS — D500 Iron deficiency anemia secondary to blood loss (chronic): Secondary | ICD-10-CM

## 2020-06-03 DIAGNOSIS — D509 Iron deficiency anemia, unspecified: Secondary | ICD-10-CM | POA: Insufficient documentation

## 2020-06-03 LAB — CBC WITH DIFFERENTIAL (CANCER CENTER ONLY)
Abs Immature Granulocytes: 0.01 10*3/uL (ref 0.00–0.07)
Basophils Absolute: 0 10*3/uL (ref 0.0–0.1)
Basophils Relative: 1 %
Eosinophils Absolute: 0.1 10*3/uL (ref 0.0–0.5)
Eosinophils Relative: 1 %
HCT: 38 % (ref 36.0–46.0)
Hemoglobin: 12.4 g/dL (ref 12.0–15.0)
Immature Granulocytes: 0 %
Lymphocytes Relative: 23 %
Lymphs Abs: 1.2 10*3/uL (ref 0.7–4.0)
MCH: 29.3 pg (ref 26.0–34.0)
MCHC: 32.6 g/dL (ref 30.0–36.0)
MCV: 89.8 fL (ref 80.0–100.0)
Monocytes Absolute: 0.5 10*3/uL (ref 0.1–1.0)
Monocytes Relative: 9 %
Neutro Abs: 3.5 10*3/uL (ref 1.7–7.7)
Neutrophils Relative %: 66 %
Platelet Count: 265 10*3/uL (ref 150–400)
RBC: 4.23 MIL/uL (ref 3.87–5.11)
RDW: 17.4 % — ABNORMAL HIGH (ref 11.5–15.5)
WBC Count: 5.3 10*3/uL (ref 4.0–10.5)
nRBC: 0 % (ref 0.0–0.2)

## 2020-06-03 LAB — IRON AND TIBC
Iron: 94 ug/dL (ref 41–142)
Saturation Ratios: 31 % (ref 21–57)
TIBC: 300 ug/dL (ref 236–444)
UIBC: 206 ug/dL (ref 120–384)

## 2020-06-03 LAB — RETIC PANEL
Immature Retic Fract: 13.3 % (ref 2.3–15.9)
RBC.: 4.22 MIL/uL (ref 3.87–5.11)
Retic Count, Absolute: 90.3 10*3/uL (ref 19.0–186.0)
Retic Ct Pct: 2.1 % (ref 0.4–3.1)
Reticulocyte Hemoglobin: 35.1 pg (ref >27.9)

## 2020-06-03 LAB — VITAMIN B12: Vitamin B-12: 293 pg/mL (ref 180–914)

## 2020-06-03 LAB — FERRITIN: Ferritin: 37 ng/mL (ref 11–307)

## 2020-06-30 ENCOUNTER — Other Ambulatory Visit (HOSPITAL_BASED_OUTPATIENT_CLINIC_OR_DEPARTMENT_OTHER): Payer: Self-pay

## 2020-07-07 ENCOUNTER — Inpatient Hospital Stay: Payer: 59 | Attending: Internal Medicine

## 2020-07-07 ENCOUNTER — Inpatient Hospital Stay: Payer: 59

## 2020-07-07 ENCOUNTER — Inpatient Hospital Stay (HOSPITAL_BASED_OUTPATIENT_CLINIC_OR_DEPARTMENT_OTHER): Payer: 59 | Admitting: Hematology

## 2020-07-07 ENCOUNTER — Other Ambulatory Visit: Payer: Self-pay

## 2020-07-07 ENCOUNTER — Telehealth: Payer: Self-pay

## 2020-07-07 ENCOUNTER — Encounter: Payer: Self-pay | Admitting: Hematology

## 2020-07-07 ENCOUNTER — Inpatient Hospital Stay: Payer: 59 | Admitting: Hematology

## 2020-07-07 VITALS — BP 136/79 | HR 73 | Temp 97.4°F | Resp 18 | Wt 196.1 lb

## 2020-07-07 DIAGNOSIS — Z86711 Personal history of pulmonary embolism: Secondary | ICD-10-CM | POA: Insufficient documentation

## 2020-07-07 DIAGNOSIS — Z79899 Other long term (current) drug therapy: Secondary | ICD-10-CM | POA: Diagnosis not present

## 2020-07-07 DIAGNOSIS — D509 Iron deficiency anemia, unspecified: Secondary | ICD-10-CM | POA: Diagnosis not present

## 2020-07-07 DIAGNOSIS — E538 Deficiency of other specified B group vitamins: Secondary | ICD-10-CM | POA: Diagnosis not present

## 2020-07-07 DIAGNOSIS — D649 Anemia, unspecified: Secondary | ICD-10-CM

## 2020-07-07 DIAGNOSIS — D500 Iron deficiency anemia secondary to blood loss (chronic): Secondary | ICD-10-CM

## 2020-07-07 DIAGNOSIS — Z7901 Long term (current) use of anticoagulants: Secondary | ICD-10-CM | POA: Insufficient documentation

## 2020-07-07 LAB — CBC WITH DIFFERENTIAL (CANCER CENTER ONLY)
Abs Immature Granulocytes: 0.01 10*3/uL (ref 0.00–0.07)
Basophils Absolute: 0 10*3/uL (ref 0.0–0.1)
Basophils Relative: 1 %
Eosinophils Absolute: 0.1 10*3/uL (ref 0.0–0.5)
Eosinophils Relative: 2 %
HCT: 19.8 % — ABNORMAL LOW (ref 36.0–46.0)
Hemoglobin: 6.2 g/dL — CL (ref 12.0–15.0)
Immature Granulocytes: 0 %
Lymphocytes Relative: 28 %
Lymphs Abs: 1.2 10*3/uL (ref 0.7–4.0)
MCH: 28.8 pg (ref 26.0–34.0)
MCHC: 31.3 g/dL (ref 30.0–36.0)
MCV: 92.1 fL (ref 80.0–100.0)
Monocytes Absolute: 0.4 10*3/uL (ref 0.1–1.0)
Monocytes Relative: 9 %
Neutro Abs: 2.5 10*3/uL (ref 1.7–7.7)
Neutrophils Relative %: 60 %
Platelet Count: 325 10*3/uL (ref 150–400)
RBC: 2.15 MIL/uL — ABNORMAL LOW (ref 3.87–5.11)
RDW: 14.1 % (ref 11.5–15.5)
WBC Count: 4.1 10*3/uL (ref 4.0–10.5)
nRBC: 0 % (ref 0.0–0.2)

## 2020-07-07 LAB — PREPARE RBC (CROSSMATCH)

## 2020-07-07 LAB — IRON AND TIBC
Iron: 16 ug/dL — ABNORMAL LOW (ref 41–142)
Saturation Ratios: 5 % — ABNORMAL LOW (ref 21–57)
TIBC: 311 ug/dL (ref 236–444)
UIBC: 295 ug/dL (ref 120–384)

## 2020-07-07 LAB — FERRITIN: Ferritin: 6 ng/mL — ABNORMAL LOW (ref 11–307)

## 2020-07-07 LAB — RETIC PANEL
Immature Retic Fract: 31 % — ABNORMAL HIGH (ref 2.3–15.9)
RBC.: 2.11 MIL/uL — ABNORMAL LOW (ref 3.87–5.11)
Retic Count, Absolute: 111.8 10*3/uL (ref 19.0–186.0)
Retic Ct Pct: 5.3 % — ABNORMAL HIGH (ref 0.4–3.1)
Reticulocyte Hemoglobin: 20.5 pg — ABNORMAL LOW (ref >27.9)

## 2020-07-07 LAB — VITAMIN B12: Vitamin B-12: 177 pg/mL — ABNORMAL LOW (ref 180–914)

## 2020-07-07 MED ORDER — LORATADINE 10 MG PO TABS
10.0000 mg | ORAL_TABLET | Freq: Once | ORAL | Status: AC
  Administered 2020-07-07: 10 mg via ORAL

## 2020-07-07 MED ORDER — ACETAMINOPHEN 325 MG PO TABS
ORAL_TABLET | ORAL | Status: AC
  Filled 2020-07-07: qty 2

## 2020-07-07 MED ORDER — CYANOCOBALAMIN 1000 MCG/ML IJ SOLN: 1000.0000 ug | INTRAMUSCULAR | 5 refills | Status: DC

## 2020-07-07 MED ORDER — CYANOCOBALAMIN 1000 MCG/ML IJ SOLN
1000.0000 ug | Freq: Once | INTRAMUSCULAR | Status: AC
  Administered 2020-07-07: 1000 ug via INTRAMUSCULAR

## 2020-07-07 MED ORDER — CYANOCOBALAMIN 1000 MCG/ML IJ SOLN
INTRAMUSCULAR | Status: AC
  Filled 2020-07-07: qty 1

## 2020-07-07 MED ORDER — LORATADINE 10 MG PO TABS
ORAL_TABLET | ORAL | Status: AC
  Filled 2020-07-07: qty 1

## 2020-07-07 MED ORDER — SODIUM CHLORIDE 0.9 % IV SOLN
INTRAVENOUS | Status: DC
  Filled 2020-07-07: qty 250

## 2020-07-07 MED ORDER — DIPHENHYDRAMINE HCL 25 MG PO CAPS
ORAL_CAPSULE | ORAL | Status: AC
  Filled 2020-07-07: qty 2

## 2020-07-07 MED ORDER — SODIUM CHLORIDE 0.9 % IV SOLN
400.0000 mg | Freq: Once | INTRAVENOUS | Status: AC
  Administered 2020-07-07: 400 mg via INTRAVENOUS
  Filled 2020-07-07: qty 20

## 2020-07-07 NOTE — Progress Notes (Signed)
Rhonda Ford   Telephone:([phone]) [phone] Fax:(336) [phone]   Clinic Follow up Note   Patient Care Team: Ronnald Nian, DO as PCP - General (Family Medicine)  Date of Service:  07/07/2020  CHIEF COMPLAINT: F/u of PEand iron deficient anemia   CURRENT THERAPY:  -IV Feraheme as neededsince 06/2018. Due to insurance she was switched to IV Venofer on 07/15/19. -oral iron and oral B12 -monthly B12 injection since 06/25/18. Changed to at home injections after 03/07/20.   INTERVAL HISTORY:  Rhonda Ford is here for a follow up of PE and IDA. She was last seen by me 4 months ago. She came in for iv iron and she was seen in infusion. She reports a menstrual period that has been ongoing for the last month. She denies bringing this to medical attention. She states she has stopped the Eliquis because of her period. She reports continuing at home B12 injections but only received 4 doses the last time she obtained it.   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   REVIEW OF SYSTEMS:   Constitutional: Denies fevers, chills or abnormal weight loss, (+) fatigue Eyes: Denies blurriness of vision Ears, nose, mouth, throat, and face: Denies mucositis or sore throat Respiratory: Denies cough, dyspnea or wheezes Cardiovascular: Denies palpitation, chest discomfort or lower extremity swelling Gastrointestinal:  Denies nausea, heartburn or change in bowel habits Skin: Denies abnormal skin rashes Lymphatics: Denies new lymphadenopathy or easy bruising, (+) mild swelling in ankles Neurological:Denies numbness, tingling or new weaknesses Behavioral/Psych: Mood is stable, no new changes  All other systems were reviewed with the patient and are negative.  MEDICAL HISTORY:  Past Medical History:  Diagnosis Date  . B12 deficiency   . Iron deficiency anemia due to chronic blood loss   . Melanoma ([place])   . Migraines   . Pulmonary emboli ([place]) 01/2018  .  Skin cancer     SURGICAL HISTORY: Past Surgical History:  Procedure Laterality Date  . gyn surgeries    . melanoma surgery      leg    I have reviewed the social history and family history with the patient and they are unchanged from previous note.  ALLERGIES:  has No Known Allergies.  MEDICATIONS:  Current Outpatient Medications  Medication Sig Dispense Refill  . cyanocobalamin (,VITAMIN B-12,) 1000 MCG/ML injection Inject 1 mL (1,000 mcg total) into the muscle every 30 (thirty) days. 1 mL 5  . apixaban (ELIQUIS) 5 MG TABS tablet Take 1 tablet (5 mg total) by mouth 2 (two) times daily. 60 tablet 4  . Ferrous Sulfate (SLOW FE PO) Take by mouth.    . fluorouracil (EFUDEX) 5 % cream Apply topically 2 (two) times daily.    [name] levothyroxine (SYNTHROID) 25 MCG tablet Take 1 tablet (25 mcg total) by mouth daily before breakfast. 90 tablet 3  . predniSONE (DELTASONE) 20 MG tablet 3 tabs po daily x 2 days, 2 tabs po x 2 days, 1 tab po x 2 days, 1/2 tab po x 2 days 13 tablet 0  . triamcinolone cream (KENALOG) 0.1 % Apply 1 application topically 2 (two) times daily. 30 g 0   No current facility-administered medications for this visit.   Facility-Administered Medications Ordered in Other Visits  Medication Dose Route Frequency Provider Last Rate Last Admin  . 0.9 %  sodium chloride infusion   Intravenous Continuous Truitt Merle, MD 20 mL/hr at 07/07/20 0354 New  Bag at 07/07/20 0906  . iron sucrose (VENOFER) 400 mg in sodium chloride 0.9 % 250 mL IVPB  400 mg Intravenous Once Truitt Merle, MD 108 mL/hr at 07/07/20 0932 400 mg at 07/07/20 0932    PHYSICAL EXAMINATION: ECOG PERFORMANCE STATUS: 1 - Symptomatic but completely ambulatory Blood pressure 136/88, heart rate of 77, respiratory rate 18, temperature 36.6, pulse ox 100% on RA  Due to COVID19 we will limit examination to appearance. Patient had no complaints.  GENERAL:alert, no distress and comfortable SKIN: skin color normal, no rashes or  significant lesions EYES: normal, Conjunctiva are pink and non-injected, sclera clear  NEURO: alert & oriented x 3 with fluent speech   LABORATORY DATA:  I have reviewed the data as listed CBC Latest Ref Rng & Units 07/07/2020 06/03/2020 04/04/2020  WBC 4.0 - 10.5 K/uL 4.1 5.3 5.0  Hemoglobin 12.0 - 15.0 g/dL 6.2(LL) 12.4 11.6(L)  Hematocrit 36.0 - 46.0 % 19.8(L) 38.0 37.4  Platelets 150 - 400 K/uL 325 265 239     CMP Latest Ref Rng & Units 11/21/2018 08/08/2018 06/19/2018  Glucose 70 - 99 mg/dL 99 99 89  BUN 6 - 20 mg/dL 9 18 20   Creatinine 0.44 - 1.00 mg/dL 0.67 0.72 0.83  Sodium 135 - 145 mmol/L 140 139 139  Potassium 3.5 - 5.1 mmol/L 3.7 3.8 4.2  Chloride 98 - 111 mmol/L 107 107 107  CO2 22 - 32 mmol/L 24 24 22   Calcium 8.9 - 10.3 mg/dL 8.5(L) 8.5(L) 8.8(L)  Total Protein 6.5 - 8.1 g/dL 6.1(L) 6.8 7.4  Total Bilirubin 0.3 - 1.2 mg/dL 0.2(L) 0.3 0.5  Alkaline Phos 38 - 126 U/L 69 75 82  AST 15 - 41 U/L 14(L) 11(L) 15  ALT 0 - 44 U/L 15 15 14       RADIOGRAPHIC STUDIES: I have personally reviewed the radiological images as listed and agreed with the findings in the report. No results found.   ASSESSMENT & PLAN:  Rhonda Ford is a 50 y.o. female with    1. Iron deficiency anemia,B12 deficiency -Likely secondary to menorrhagia.Unclear the etiology about her B12 deficiency, her intrinsic factor was negative -Per patient she has been anemic ranging from 7.5-10 since she was an adolescent with chronic fatigue. This had not been worked up until her PE in late 2019. -Workup done by Dr. Walden Field in 06/2018 is consistent with iron deficiency anemia.  -Her 01/2018 colonoscopy and EGD which was normal except stomach ulcer, no H. Pylorie. Shehas recently stoppedPPI -She has had long history of menorrhagia which is the likely cause of her anemia. Since 04/2018 her periods have been irregular and occasionally lighter. She feels she may be perimenopausal. -She was on IV Feraheme  as needed since 06/2018. Due to insurance she was switched to IV Venofer on 07/15/19.   -She also has B12 deficiencybased on lab in early 2020. She has had no gastric or GI surgeries.She is currently on monthly B12 injectionssince 3/2020but she missed many injections, she is also on oral B12.  -She has menstrual period that has been ongoing daily for the last month, has not seen GYN for this -Lab reviewed today, she has developed severe anemia with hemoglobin 6.2 today secondary to prolonged vaginal bleeding, iron and B12 deficiency. -Will proceed with IV Venofer 400mg  weekly for [redacted] weeks along with B12 injection today (07/07/20).  -She will continue at home B12 injections.  -Monitor with monthly labs. Continue B12 injection monthly, will consider weekly injection if  her level is low today, continue oral B12 and Oral iron. Continue IV Venofer as needed with goal ferritin above 100 -F/u in 6 months   2.H/oPulmonary Emboli, Unprovoked -Occurred in10/2019.No PE seen on 07/03/18 Angio CT chest. -She has been treated with Eliquis since3/2020. Given this was significant, unprovokedPEand no family history of PE, it has been recommended tocontinue indefinitely. -Continue to hold Eliquis given anemia from menorrhagia.  3. Melanomaand Squamous cell carcinoma.  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 She is followed by dermatologywho she saw when she lived in [place].  -She has recent recurrence of squamous cell (2021). She is currently being treated with topical cream by Dermatologist.    PLAN: -Proceed with Venofer400mg   and B12 injection today  -1u blood transfusion in next 3 days -Continue B12 home injections monthly, will set up weekly injections if B12 level low today  -Continue oral B12  -IV Venofer 400mg  07/09/20 and 07/12/20 -Lab monthly with iv Venofer if ferritin<100.   -F/u in 2 months -I encouraged her to f/u with GYN for prolonged vaginal  bleeding    No problem-specific Assessment & Plan notes found for this encounter.   Orders Placed This Encounter  Procedures  . Vitamin B12    Add to lab draw today    Standing Status:   Future    Standing Expiration Date:   07/07/2021   All questions were answered. The patient knows to call the clinic with any problems, questions or concerns. No barriers to learning was detected. The total time spent in the appointment was 30 minutes.     Truitt Merle, MD 07/07/2020   I, Wilburn Mylar, am acting as scribe for Truitt Merle, MD.   I have reviewed the above documentation for accuracy and completeness, and I agree with the above.

## 2020-07-07 NOTE — Telephone Encounter (Signed)
CRITICAL VALUE STICKER  CRITICAL VALUE: Hgb = 6.2  RECEIVER (on-site recipient of call): Rhonda Ford, [place] NOTIFIED: 07/07/2020 at 8:09am  MESSENGER (representative from lab): Pam  MD NOTIFIED: Dr. Burr Medico  TIME OF NOTIFICATION: 07/07/2020 at 8:12am  RESPONSE: Notification given to Tollie Pizza., RN for follow-up with provider and pt.

## 2020-07-07 NOTE — Progress Notes (Signed)
Dr Burr Medico states to give B12 shot today due to pt condition  Pt did not want to stay for 63minutes post observation, VSS, pt stable upon exiting clinic.

## 2020-07-07 NOTE — Patient Instructions (Addendum)
Iron Sucrose injection What is this medicine? IRON SUCROSE (AHY ern SOO krohs) is an iron complex. Iron is used to make healthy red blood cells, which carry oxygen and nutrients throughout the body. This medicine is used to treat iron deficiency anemia in people with chronic kidney disease. This medicine may be used for other purposes; ask your health care provider or pharmacist if you have questions. COMMON BRAND NAME(S): Venofer What should I tell my health care provider before I take this medicine? They need to know if you have any of these conditions:  anemia not caused by low iron levels  heart disease  high levels of iron in the blood  kidney disease  liver disease  an unusual or allergic reaction to iron, other medicines, foods, dyes, or preservatives  pregnant or trying to get pregnant  breast-feeding How should I use this medicine? This medicine is for infusion into a vein. It is given by a health care professional in a hospital or clinic setting. Talk to your pediatrician regarding the use of this medicine in children. While this drug may be prescribed for children as young as 2 years for selected conditions, precautions do apply. Overdosage: If you think you have taken too much of this medicine contact a poison control center or emergency room at once. NOTE: This medicine is only for you. Do not share this medicine with others. What if I miss a dose? It is important not to miss your dose. Call your doctor or health care professional if you are unable to keep an appointment. What may interact with this medicine? Do not take this medicine with any of the following medications:  deferoxamine  dimercaprol  other iron products This medicine may also interact with the following medications:  chloramphenicol  deferasirox This list may not describe all possible interactions. Give your health care provider a list of all the medicines, herbs, non-prescription drugs, or  dietary supplements you use. Also tell them if you smoke, drink alcohol, or use illegal drugs. Some items may interact with your medicine. What should I watch for while using this medicine? Visit your doctor or healthcare professional regularly. Tell your doctor or healthcare professional if your symptoms do not start to get better or if they get worse. You may need blood work done while you are taking this medicine. You may need to follow a special diet. Talk to your doctor. Foods that contain iron include: whole grains/cereals, dried fruits, beans, or peas, leafy green vegetables, and organ meats (liver, kidney). What side effects may I notice from receiving this medicine? Side effects that you should report to your doctor or health care professional as soon as possible:  allergic reactions like skin rash, itching or hives, swelling of the face, lips, or tongue  breathing problems  changes in blood pressure  cough  fast, irregular heartbeat  feeling faint or lightheaded, falls  fever or chills  flushing, sweating, or hot feelings  joint or muscle aches/pains  seizures  swelling of the ankles or feet  unusually weak or tired Side effects that usually do not require medical attention (report to your doctor or health care professional if they continue or are bothersome):  diarrhea  feeling achy  headache  irritation at site where injected  nausea, vomiting  stomach upset  tiredness This list may not describe all possible side effects. Call your doctor for medical advice about side effects. You may report side effects to FDA at 1-800-FDA-1088. Where should I keep   my medicine? This drug is given in a hospital or clinic and will not be stored at home. NOTE: This sheet is a summary. It may not cover all possible information. If you have questions about this medicine, talk to your doctor, pharmacist, or health care provider.  2021 Elsevier/Gold Standard (2011-01-04  17:14:35) Cyanocobalamin, Vitamin B12 injection What is this medicine? CYANOCOBALAMIN (sye an oh koe BAL a min) is a man made form of vitamin B12. Vitamin B12 is used in the growth of healthy blood cells, nerve cells, and proteins in the body. It also helps with the metabolism of fats and carbohydrates. This medicine is used to treat people who can not absorb vitamin B12. This medicine may be used for other purposes; ask your health care provider or pharmacist if you have questions. COMMON BRAND NAME(S): B-12 Compliance Kit, B-12 Injection Kit, Cyomin, LA-12, Nutri-Twelve, Physicians EZ Use B-12, Primabalt What should I tell my health care provider before I take this medicine? They need to know if you have any of these conditions:  kidney disease  Leber's disease  megaloblastic anemia  an unusual or allergic reaction to cyanocobalamin, cobalt, other medicines, foods, dyes, or preservatives  pregnant or trying to get pregnant  breast-feeding How should I use this medicine? This medicine is injected into a muscle or deeply under the skin. It is usually given by a health care professional in a clinic or doctor's office. However, your doctor may teach you how to inject yourself. Follow all instructions. Talk to your pediatrician regarding the use of this medicine in children. Special care may be needed. Overdosage: If you think you have taken too much of this medicine contact a poison control center or emergency room at once. NOTE: This medicine is only for you. Do not share this medicine with others. What if I miss a dose? If you are given your dose at a clinic or doctor's office, call to reschedule your appointment. If you give your own injections and you miss a dose, take it as soon as you can. If it is almost time for your next dose, take only that dose. Do not take double or extra doses. What may interact with this medicine?  colchicine  heavy alcohol intake This list may not  describe all possible interactions. Give your health care provider a list of all the medicines, herbs, non-prescription drugs, or dietary supplements you use. Also tell them if you smoke, drink alcohol, or use illegal drugs. Some items may interact with your medicine. What should I watch for while using this medicine? Visit your doctor or health care professional regularly. You may need blood work done while you are taking this medicine. You may need to follow a special diet. Talk to your doctor. Limit your alcohol intake and avoid smoking to get the best benefit. What side effects may I notice from receiving this medicine? Side effects that you should report to your doctor or health care professional as soon as possible:  allergic reactions like skin rash, itching or hives, swelling of the face, lips, or tongue  blue tint to skin  chest tightness, pain  difficulty breathing, wheezing  dizziness  red, swollen painful area on the leg Side effects that usually do not require medical attention (report to your doctor or health care professional if they continue or are bothersome):  diarrhea  headache This list may not describe all possible side effects. Call your doctor for medical advice about side effects. You may report side effects   to FDA at 1-800-FDA-1088. Where should I keep my medicine? Keep out of the reach of children. Store at room temperature between 15 and 30 degrees C (59 and 85 degrees F). Protect from light. Throw away any unused medicine after the expiration date. NOTE: This sheet is a summary. It may not cover all possible information. If you have questions about this medicine, talk to your doctor, pharmacist, or health care provider.  2021 Elsevier/Gold Standard (2007-07-07 22:10:20)  

## 2020-07-07 NOTE — Telephone Encounter (Signed)
Critical value: Hgb 6.2 Dr Burr Medico notified

## 2020-07-09 ENCOUNTER — Inpatient Hospital Stay: Payer: 59 | Attending: Internal Medicine

## 2020-07-09 ENCOUNTER — Other Ambulatory Visit: Payer: Self-pay

## 2020-07-09 DIAGNOSIS — D509 Iron deficiency anemia, unspecified: Secondary | ICD-10-CM | POA: Insufficient documentation

## 2020-07-09 DIAGNOSIS — E538 Deficiency of other specified B group vitamins: Secondary | ICD-10-CM | POA: Insufficient documentation

## 2020-07-09 DIAGNOSIS — D649 Anemia, unspecified: Secondary | ICD-10-CM

## 2020-07-09 MED ORDER — SODIUM CHLORIDE 0.9% IV SOLUTION
250.0000 mL | Freq: Once | INTRAVENOUS | Status: AC
  Administered 2020-07-09: 250 mL via INTRAVENOUS
  Filled 2020-07-09: qty 250

## 2020-07-09 NOTE — Patient Instructions (Signed)

## 2020-07-10 LAB — TYPE AND SCREEN
ABO/RH(D): O POS
Antibody Screen: NEGATIVE
Unit division: 0

## 2020-07-10 LAB — BPAM RBC
Blood Product Expiration Date: 202204272359
ISSUE DATE / TIME: 202204020934
Unit Type and Rh: 5100

## 2020-07-11 ENCOUNTER — Other Ambulatory Visit (HOSPITAL_COMMUNITY): Payer: Self-pay

## 2020-07-11 ENCOUNTER — Telehealth: Payer: Self-pay | Admitting: Hematology

## 2020-07-11 ENCOUNTER — Telehealth: Payer: Self-pay

## 2020-07-11 ENCOUNTER — Other Ambulatory Visit: Payer: Self-pay

## 2020-07-11 DIAGNOSIS — D649 Anemia, unspecified: Secondary | ICD-10-CM

## 2020-07-11 DIAGNOSIS — E538 Deficiency of other specified B group vitamins: Secondary | ICD-10-CM

## 2020-07-11 MED ORDER — "BD LUER-LOK SYRINGE 25G X 5/8"" 3 ML MISC"
4 refills | Status: DC
  Filled 2020-07-11: qty 4, fill #0
  Filled 2020-07-11 – 2020-07-22 (×2): qty 4, 28d supply, fill #0
  Filled 2020-10-19: qty 4, 28d supply, fill #1
  Filled 2021-05-03: qty 4, 28d supply, fill #2

## 2020-07-11 MED ORDER — CYANOCOBALAMIN 1000 MCG/ML IJ SOLN
INTRAMUSCULAR | 4 refills | Status: DC
  Filled 2020-07-11 – 2020-07-22 (×2): qty 4, 28d supply, fill #0
  Filled 2020-10-19: qty 4, 28d supply, fill #1
  Filled 2021-05-03: qty 1, 28d supply, fill #2

## 2020-07-11 NOTE — Telephone Encounter (Signed)
I spoke with Rhonda Ford she verbalized understanding.  She requested prescriptions be sent to [facility] outpatient pharmacy.  Prescriptions for B12 and syringes sent to [facility].

## 2020-07-11 NOTE — Telephone Encounter (Signed)
-----   Message from Truitt Merle, MD sent at 07/10/2020 10:25 AM EDT ----- Please let pt know her B12 level is low, I recommend B12 injection weekly X8 then resume monthly injection. She does injections at home, call in prescription if needed, thanks   Truitt Merle 07/10/2020

## 2020-07-11 NOTE — Telephone Encounter (Signed)
Scheduled follow-up appointment per 3/31 los. Patient is aware.

## 2020-07-12 ENCOUNTER — Inpatient Hospital Stay: Payer: 59

## 2020-07-12 ENCOUNTER — Other Ambulatory Visit: Payer: Self-pay

## 2020-07-12 VITALS — BP 135/83 | HR 75 | Temp 97.8°F | Resp 18

## 2020-07-12 DIAGNOSIS — D500 Iron deficiency anemia secondary to blood loss (chronic): Secondary | ICD-10-CM

## 2020-07-12 DIAGNOSIS — E538 Deficiency of other specified B group vitamins: Secondary | ICD-10-CM | POA: Diagnosis not present

## 2020-07-12 DIAGNOSIS — D509 Iron deficiency anemia, unspecified: Secondary | ICD-10-CM | POA: Diagnosis not present

## 2020-07-12 DIAGNOSIS — D649 Anemia, unspecified: Secondary | ICD-10-CM

## 2020-07-12 LAB — IRON AND TIBC
Iron: 39 ug/dL — ABNORMAL LOW (ref 41–142)
Saturation Ratios: 12 % — ABNORMAL LOW (ref 21–57)
TIBC: 314 ug/dL (ref 236–444)
UIBC: 275 ug/dL (ref 120–384)

## 2020-07-12 LAB — CBC WITH DIFFERENTIAL (CANCER CENTER ONLY)
Abs Immature Granulocytes: 0.04 10*3/uL (ref 0.00–0.07)
Basophils Absolute: 0 10*3/uL (ref 0.0–0.1)
Basophils Relative: 0 %
Eosinophils Absolute: 0.1 10*3/uL (ref 0.0–0.5)
Eosinophils Relative: 3 %
HCT: 26.6 % — ABNORMAL LOW (ref 36.0–46.0)
Hemoglobin: 8.6 g/dL — ABNORMAL LOW (ref 12.0–15.0)
Immature Granulocytes: 1 %
Lymphocytes Relative: 20 %
Lymphs Abs: 1.2 10*3/uL (ref 0.7–4.0)
MCH: 29.8 pg (ref 26.0–34.0)
MCHC: 32.3 g/dL (ref 30.0–36.0)
MCV: 92 fL (ref 80.0–100.0)
Monocytes Absolute: 0.4 10*3/uL (ref 0.1–1.0)
Monocytes Relative: 7 %
Neutro Abs: 3.9 10*3/uL (ref 1.7–7.7)
Neutrophils Relative %: 69 %
Platelet Count: 274 10*3/uL (ref 150–400)
RBC: 2.89 MIL/uL — ABNORMAL LOW (ref 3.87–5.11)
RDW: 16.2 % — ABNORMAL HIGH (ref 11.5–15.5)
WBC Count: 5.6 10*3/uL (ref 4.0–10.5)
nRBC: 0.5 % — ABNORMAL HIGH (ref 0.0–0.2)

## 2020-07-12 LAB — RETIC PANEL
Immature Retic Fract: 36.8 % — ABNORMAL HIGH (ref 2.3–15.9)
RBC.: 2.86 MIL/uL — ABNORMAL LOW (ref 3.87–5.11)
Retic Count, Absolute: 216.2 10*3/uL — ABNORMAL HIGH (ref 19.0–186.0)
Retic Ct Pct: 7.6 % — ABNORMAL HIGH (ref 0.4–3.1)
Reticulocyte Hemoglobin: 32.9 pg (ref >27.9)

## 2020-07-12 LAB — FERRITIN: Ferritin: 107 ng/mL (ref 11–307)

## 2020-07-12 LAB — SAMPLE TO BLOOD BANK

## 2020-07-12 MED ORDER — SODIUM CHLORIDE 0.9 % IV SOLN
INTRAVENOUS | Status: DC
  Filled 2020-07-12: qty 250

## 2020-07-12 MED ORDER — SODIUM CHLORIDE 0.9 % IV SOLN
400.0000 mg | Freq: Once | INTRAVENOUS | Status: AC
  Administered 2020-07-12: 400 mg via INTRAVENOUS
  Filled 2020-07-12: qty 20

## 2020-07-12 MED ORDER — CYANOCOBALAMIN 1000 MCG/ML IJ SOLN: 1000.0000 ug | Freq: Once | INTRAMUSCULAR | Status: DC

## 2020-07-12 NOTE — Progress Notes (Signed)
Patient decided to wait 30 minute post observation period. Vitals within normal limits. IV catheter removed and intact.

## 2020-07-12 NOTE — Patient Instructions (Signed)
Iron Sucrose injection What is this medicine? IRON SUCROSE (AHY ern SOO krohs) is an iron complex. Iron is used to make healthy red blood cells, which carry oxygen and nutrients throughout the body. This medicine is used to treat iron deficiency anemia in people with chronic kidney disease. This medicine may be used for other purposes; ask your health care provider or pharmacist if you have questions. COMMON BRAND NAME(S): Venofer What should I tell my health care provider before I take this medicine? They need to know if you have any of these conditions:  anemia not caused by low iron levels  heart disease  high levels of iron in the blood  kidney disease  liver disease  an unusual or allergic reaction to iron, other medicines, foods, dyes, or preservatives  pregnant or trying to get pregnant  breast-feeding How should I use this medicine? This medicine is for infusion into a vein. It is given by a health care professional in a hospital or clinic setting. Talk to your pediatrician regarding the use of this medicine in children. While this drug may be prescribed for children as young as 2 years for selected conditions, precautions do apply. Overdosage: If you think you have taken too much of this medicine contact a poison control center or emergency room at once. NOTE: This medicine is only for you. Do not share this medicine with others. What if I miss a dose? It is important not to miss your dose. Call your doctor or health care professional if you are unable to keep an appointment. What may interact with this medicine? Do not take this medicine with any of the following medications:  deferoxamine  dimercaprol  other iron products This medicine may also interact with the following medications:  chloramphenicol  deferasirox This list may not describe all possible interactions. Give your health care provider a list of all the medicines, herbs, non-prescription drugs, or  dietary supplements you use. Also tell them if you smoke, drink alcohol, or use illegal drugs. Some items may interact with your medicine. What should I watch for while using this medicine? Visit your doctor or healthcare professional regularly. Tell your doctor or healthcare professional if your symptoms do not start to get better or if they get worse. You may need blood work done while you are taking this medicine. You may need to follow a special diet. Talk to your doctor. Foods that contain iron include: whole grains/cereals, dried fruits, beans, or peas, leafy green vegetables, and organ meats (liver, kidney). What side effects may I notice from receiving this medicine? Side effects that you should report to your doctor or health care professional as soon as possible:  allergic reactions like skin rash, itching or hives, swelling of the face, lips, or tongue  breathing problems  changes in blood pressure  cough  fast, irregular heartbeat  feeling faint or lightheaded, falls  fever or chills  flushing, sweating, or hot feelings  joint or muscle aches/pains  seizures  swelling of the ankles or feet  unusually weak or tired Side effects that usually do not require medical attention (report to your doctor or health care professional if they continue or are bothersome):  diarrhea  feeling achy  headache  irritation at site where injected  nausea, vomiting  stomach upset  tiredness This list may not describe all possible side effects. Call your doctor for medical advice about side effects. You may report side effects to FDA at 1-800-FDA-1088. Where should I keep   my medicine? This drug is given in a hospital or clinic and will not be stored at home. NOTE: This sheet is a summary. It may not cover all possible information. If you have questions about this medicine, talk to your doctor, pharmacist, or health care provider.  2021 Elsevier/Gold Standard (2011-01-04  17:14:35) Cyanocobalamin, Vitamin B12 injection What is this medicine? CYANOCOBALAMIN (sye an oh koe BAL a min) is a man made form of vitamin B12. Vitamin B12 is used in the growth of healthy blood cells, nerve cells, and proteins in the body. It also helps with the metabolism of fats and carbohydrates. This medicine is used to treat people who can not absorb vitamin B12. This medicine may be used for other purposes; ask your health care provider or pharmacist if you have questions. COMMON BRAND NAME(S): B-12 Compliance Kit, B-12 Injection Kit, Cyomin, LA-12, Nutri-Twelve, Physicians EZ Use B-12, Primabalt What should I tell my health care provider before I take this medicine? They need to know if you have any of these conditions:  kidney disease  Leber's disease  megaloblastic anemia  an unusual or allergic reaction to cyanocobalamin, cobalt, other medicines, foods, dyes, or preservatives  pregnant or trying to get pregnant  breast-feeding How should I use this medicine? This medicine is injected into a muscle or deeply under the skin. It is usually given by a health care professional in a clinic or doctor's office. However, your doctor may teach you how to inject yourself. Follow all instructions. Talk to your pediatrician regarding the use of this medicine in children. Special care may be needed. Overdosage: If you think you have taken too much of this medicine contact a poison control center or emergency room at once. NOTE: This medicine is only for you. Do not share this medicine with others. What if I miss a dose? If you are given your dose at a clinic or doctor's office, call to reschedule your appointment. If you give your own injections and you miss a dose, take it as soon as you can. If it is almost time for your next dose, take only that dose. Do not take double or extra doses. What may interact with this medicine?  colchicine  heavy alcohol intake This list may not  describe all possible interactions. Give your health care provider a list of all the medicines, herbs, non-prescription drugs, or dietary supplements you use. Also tell them if you smoke, drink alcohol, or use illegal drugs. Some items may interact with your medicine. What should I watch for while using this medicine? Visit your doctor or health care professional regularly. You may need blood work done while you are taking this medicine. You may need to follow a special diet. Talk to your doctor. Limit your alcohol intake and avoid smoking to get the best benefit. What side effects may I notice from receiving this medicine? Side effects that you should report to your doctor or health care professional as soon as possible:  allergic reactions like skin rash, itching or hives, swelling of the face, lips, or tongue  blue tint to skin  chest tightness, pain  difficulty breathing, wheezing  dizziness  red, swollen painful area on the leg Side effects that usually do not require medical attention (report to your doctor or health care professional if they continue or are bothersome):  diarrhea  headache This list may not describe all possible side effects. Call your doctor for medical advice about side effects. You may report side effects   to FDA at 1-800-FDA-1088. Where should I keep my medicine? Keep out of the reach of children. Store at room temperature between 15 and 30 degrees C (59 and 85 degrees F). Protect from light. Throw away any unused medicine after the expiration date. NOTE: This sheet is a summary. It may not cover all possible information. If you have questions about this medicine, talk to your doctor, pharmacist, or health care provider.  2021 Elsevier/Gold Standard (2007-07-07 22:10:20)  

## 2020-07-14 ENCOUNTER — Other Ambulatory Visit (HOSPITAL_COMMUNITY): Payer: Self-pay

## 2020-07-19 ENCOUNTER — Other Ambulatory Visit: Payer: Self-pay

## 2020-07-19 DIAGNOSIS — D649 Anemia, unspecified: Secondary | ICD-10-CM

## 2020-07-21 ENCOUNTER — Ambulatory Visit: Payer: 59

## 2020-07-21 ENCOUNTER — Other Ambulatory Visit: Payer: 59

## 2020-07-22 ENCOUNTER — Encounter: Payer: Self-pay | Admitting: Hematology

## 2020-07-22 ENCOUNTER — Other Ambulatory Visit: Payer: Self-pay

## 2020-07-22 ENCOUNTER — Other Ambulatory Visit (HOSPITAL_COMMUNITY): Payer: Self-pay

## 2020-07-22 ENCOUNTER — Inpatient Hospital Stay: Payer: 59

## 2020-07-22 ENCOUNTER — Other Ambulatory Visit: Payer: Self-pay | Admitting: Family Medicine

## 2020-07-22 VITALS — BP 165/90 | HR 64 | Temp 97.9°F | Resp 17

## 2020-07-22 DIAGNOSIS — D500 Iron deficiency anemia secondary to blood loss (chronic): Secondary | ICD-10-CM

## 2020-07-22 DIAGNOSIS — E039 Hypothyroidism, unspecified: Secondary | ICD-10-CM

## 2020-07-22 DIAGNOSIS — D509 Iron deficiency anemia, unspecified: Secondary | ICD-10-CM | POA: Diagnosis not present

## 2020-07-22 DIAGNOSIS — E538 Deficiency of other specified B group vitamins: Secondary | ICD-10-CM | POA: Diagnosis not present

## 2020-07-22 LAB — IRON AND TIBC
Iron: 75 ug/dL (ref 41–142)
Saturation Ratios: 23 % (ref 21–57)
TIBC: 321 ug/dL (ref 236–444)
UIBC: 246 ug/dL (ref 120–384)

## 2020-07-22 LAB — CBC WITH DIFFERENTIAL (CANCER CENTER ONLY)
Abs Immature Granulocytes: 0.04 10*3/uL (ref 0.00–0.07)
Basophils Absolute: 0 10*3/uL (ref 0.0–0.1)
Basophils Relative: 1 %
Eosinophils Absolute: 0.1 10*3/uL (ref 0.0–0.5)
Eosinophils Relative: 2 %
HCT: 36.2 % (ref 36.0–46.0)
Hemoglobin: 11.2 g/dL — ABNORMAL LOW (ref 12.0–15.0)
Immature Granulocytes: 1 %
Lymphocytes Relative: 23 %
Lymphs Abs: 1 10*3/uL (ref 0.7–4.0)
MCH: 29.1 pg (ref 26.0–34.0)
MCHC: 30.9 g/dL (ref 30.0–36.0)
MCV: 94 fL (ref 80.0–100.0)
Monocytes Absolute: 0.4 10*3/uL (ref 0.1–1.0)
Monocytes Relative: 8 %
Neutro Abs: 2.8 10*3/uL (ref 1.7–7.7)
Neutrophils Relative %: 65 %
Platelet Count: 351 10*3/uL (ref 150–400)
RBC: 3.85 MIL/uL — ABNORMAL LOW (ref 3.87–5.11)
RDW: 16 % — ABNORMAL HIGH (ref 11.5–15.5)
WBC Count: 4.4 10*3/uL (ref 4.0–10.5)
nRBC: 0 % (ref 0.0–0.2)

## 2020-07-22 LAB — RETIC PANEL
Immature Retic Fract: 15.8 % (ref 2.3–15.9)
RBC.: 3.8 MIL/uL — ABNORMAL LOW (ref 3.87–5.11)
Retic Count, Absolute: 173.5 10*3/uL (ref 19.0–186.0)
Retic Ct Pct: 4.6 % — ABNORMAL HIGH (ref 0.4–3.1)
Reticulocyte Hemoglobin: 30.5 pg (ref >27.9)

## 2020-07-22 LAB — FERRITIN: Ferritin: 106 ng/mL (ref 11–307)

## 2020-07-22 MED ORDER — SODIUM CHLORIDE 0.9 % IV SOLN
400.0000 mg | Freq: Once | INTRAVENOUS | Status: AC
  Administered 2020-07-22: 400 mg via INTRAVENOUS
  Filled 2020-07-22: qty 20

## 2020-07-22 MED ORDER — SODIUM CHLORIDE 0.9 % IV SOLN
INTRAVENOUS | Status: DC
  Filled 2020-07-22: qty 250

## 2020-07-22 MED FILL — Apixaban Tab 5 MG: ORAL | 30 days supply | Qty: 60 | Fill #0 | Status: AC

## 2020-07-22 NOTE — Patient Instructions (Signed)

## 2020-07-25 ENCOUNTER — Other Ambulatory Visit (HOSPITAL_COMMUNITY): Payer: Self-pay

## 2020-07-25 ENCOUNTER — Telehealth: Payer: Self-pay | Admitting: Family Medicine

## 2020-07-25 MED ORDER — LEVOTHYROXINE SODIUM 25 MCG PO TABS
ORAL_TABLET | Freq: Every day | ORAL | 2 refills | Status: DC
  Filled 2020-07-25: qty 90, 90d supply, fill #0
  Filled 2020-10-19: qty 90, 90d supply, fill #1

## 2020-07-25 NOTE — Telephone Encounter (Signed)
Last OV 11/11/19 Last fill 05/20/19  #90/2 Next OV 08/17/20

## 2020-07-25 NOTE — Telephone Encounter (Signed)
Patient scheduled an appointment via MyChart for "cardiac symptoms such as tiredness, weight fluctuations, shortness of breath, weakness". Called and left a detailed message to contact the office as soon as possible to be further triaged.

## 2020-08-05 ENCOUNTER — Inpatient Hospital Stay: Payer: 59

## 2020-08-05 ENCOUNTER — Other Ambulatory Visit: Payer: Self-pay

## 2020-08-05 DIAGNOSIS — D509 Iron deficiency anemia, unspecified: Secondary | ICD-10-CM | POA: Diagnosis not present

## 2020-08-05 DIAGNOSIS — D500 Iron deficiency anemia secondary to blood loss (chronic): Secondary | ICD-10-CM

## 2020-08-05 DIAGNOSIS — E538 Deficiency of other specified B group vitamins: Secondary | ICD-10-CM | POA: Diagnosis not present

## 2020-08-05 LAB — CBC WITH DIFFERENTIAL (CANCER CENTER ONLY)
Abs Immature Granulocytes: 0.01 10*3/uL (ref 0.00–0.07)
Basophils Absolute: 0 10*3/uL (ref 0.0–0.1)
Basophils Relative: 1 %
Eosinophils Absolute: 0.1 10*3/uL (ref 0.0–0.5)
Eosinophils Relative: 2 %
HCT: 38.6 % (ref 36.0–46.0)
Hemoglobin: 12.2 g/dL (ref 12.0–15.0)
Immature Granulocytes: 0 %
Lymphocytes Relative: 29 %
Lymphs Abs: 1.2 10*3/uL (ref 0.7–4.0)
MCH: 29.5 pg (ref 26.0–34.0)
MCHC: 31.6 g/dL (ref 30.0–36.0)
MCV: 93.5 fL (ref 80.0–100.0)
Monocytes Absolute: 0.3 10*3/uL (ref 0.1–1.0)
Monocytes Relative: 6 %
Neutro Abs: 2.6 10*3/uL (ref 1.7–7.7)
Neutrophils Relative %: 62 %
Platelet Count: 213 10*3/uL (ref 150–400)
RBC: 4.13 MIL/uL (ref 3.87–5.11)
RDW: 14.8 % (ref 11.5–15.5)
WBC Count: 4.2 10*3/uL (ref 4.0–10.5)
nRBC: 0 % (ref 0.0–0.2)

## 2020-08-05 LAB — IRON AND TIBC
Iron: 102 ug/dL (ref 41–142)
Saturation Ratios: 38 % (ref 21–57)
TIBC: 273 ug/dL (ref 236–444)
UIBC: 170 ug/dL (ref 120–384)

## 2020-08-05 LAB — RETIC PANEL
Immature Retic Fract: 11.5 % (ref 2.3–15.9)
RBC.: 4.11 MIL/uL (ref 3.87–5.11)
Retic Count, Absolute: 63.7 10*3/uL (ref 19.0–186.0)
Retic Ct Pct: 1.6 % (ref 0.4–3.1)
Reticulocyte Hemoglobin: 33.7 pg (ref >27.9)

## 2020-08-05 LAB — FERRITIN: Ferritin: 116 ng/mL (ref 11–307)

## 2020-08-07 ENCOUNTER — Encounter: Payer: Self-pay | Admitting: Hematology

## 2020-08-16 ENCOUNTER — Telehealth: Payer: Self-pay | Admitting: Family Medicine

## 2020-08-16 NOTE — Telephone Encounter (Signed)
I read pt's My Chart appointment she made for 08/17/20 and called the Nurse Triage and transferred her over. Her notes were-Cardiac symptoms such as tiredness, weight fluctuations, shortness of breath, weakness. I left her appointment alone to see what NT might want to do.

## 2020-08-17 ENCOUNTER — Ambulatory Visit (INDEPENDENT_AMBULATORY_CARE_PROVIDER_SITE_OTHER): Payer: 59 | Admitting: Family Medicine

## 2020-08-17 ENCOUNTER — Encounter: Payer: Self-pay | Admitting: Family Medicine

## 2020-08-17 ENCOUNTER — Other Ambulatory Visit: Payer: Self-pay

## 2020-08-17 VITALS — BP 140/90 | HR 75 | Temp 97.9°F | Ht 64.0 in | Wt 196.8 lb

## 2020-08-17 DIAGNOSIS — R0789 Other chest pain: Secondary | ICD-10-CM

## 2020-08-17 DIAGNOSIS — R0600 Dyspnea, unspecified: Secondary | ICD-10-CM | POA: Diagnosis not present

## 2020-08-17 DIAGNOSIS — I10 Essential (primary) hypertension: Secondary | ICD-10-CM | POA: Diagnosis not present

## 2020-08-17 DIAGNOSIS — R0609 Other forms of dyspnea: Secondary | ICD-10-CM

## 2020-08-17 DIAGNOSIS — Z13220 Encounter for screening for lipoid disorders: Secondary | ICD-10-CM | POA: Diagnosis not present

## 2020-08-17 LAB — LIPID PANEL
Cholesterol: 201 mg/dL — ABNORMAL HIGH (ref 0–200)
HDL: 62.3 mg/dL (ref >39.00)
LDL Cholesterol: 125 mg/dL — ABNORMAL HIGH (ref 0–99)
NonHDL: 138.55
Total CHOL/HDL Ratio: 3
Triglycerides: 69 mg/dL (ref 0.0–149.0)
VLDL: 13.8 mg/dL (ref 0.0–40.0)

## 2020-08-17 LAB — BASIC METABOLIC PANEL
BUN: 15 mg/dL (ref 6–23)
CO2: 26 mEq/L (ref 19–32)
Calcium: 9.3 mg/dL (ref 8.4–10.5)
Chloride: 105 mEq/L (ref 96–112)
Creatinine, Ser: 0.54 mg/dL (ref 0.40–1.20)
GFR: 106.18 mL/min (ref >60.00)
Glucose, Bld: 95 mg/dL (ref 70–99)
Potassium: 3.8 mEq/L (ref 3.5–5.1)
Sodium: 140 mEq/L (ref 135–145)

## 2020-08-17 MED ORDER — LOSARTAN POTASSIUM 25 MG PO TABS
25.0000 mg | ORAL_TABLET | Freq: Every day | ORAL | 1 refills | Status: DC
Start: 2020-08-17 — End: 2021-01-19

## 2020-08-17 NOTE — Progress Notes (Signed)
Rhonda Ford is a 50 y.o. female  Chief Complaint  Patient presents with  . Acute Visit    Pt c/o, numbness, forgetfulness, tiredness, weight fluctuations, Pressure in chest, chest and back pain., weakness    HPI: Rhonda Ford is a 50 y.o. female patient who presents with multiple and various complaints. Symptoms x 6-7 mo on/off.  She complains of SOB with minimal exertion. She states BP has been fluctuating per her checking at home - 140-150's/80-90's. She gets "gripping"chest pain or "pressure" - can be anterior chest, Lt lateral chest. Symptoms can last hours to days. She notes associated nausea at times. She denies diaphoresis, increased SOB. She had an episode of chest pressure 2 nights ago that extended to her Lt chest and arm.     She also complains of fatigue, tiredness. Pt works 5-6 12hr shifts per week. MGM - MI at 66yo Mother - a-fib, CVA Maternal aunt with CVA   She has a h/o PE and is on eliquis. She is compliant with medication.  Pt with B12 deficiency and iron deficiency anemia for which she receives iron infusions. Last infusion in 07/2020. She follows with Dr. Truitt Merle. Pt is not a smoker.  No h/o COVID.  Past Medical History:  Diagnosis Date  . B12 deficiency   . Iron deficiency anemia due to chronic blood loss   . Melanoma ([place])   . Migraines   . Pulmonary emboli ([place]) 01/2018  . Skin cancer     Past Surgical History:  Procedure Laterality Date  . gyn surgeries    . melanoma surgery      leg    Social History   Socioeconomic History  . Marital status: Divorced    Spouse name: Not on file  . Number of children: 5  . Years of education: Not on file  . Highest education level: Not on file  Occupational History  . Occupation: nurse   Tobacco Use  . Smoking status: Never Smoker  . Smokeless tobacco: Never Used  Vaping Use  . Vaping Use: Never used  Substance and Sexual Activity  . Alcohol use: Not Currently  . Drug use: Not  Currently  . Sexual activity: Not on file  Other Topics Concern  . Not on file  Social History Narrative  . Not on file   Social Determinants of Health   Financial Resource Strain: Not on file  Food Insecurity: Not on file  Transportation Needs: Not on file  Physical Activity: Not on file  Stress: Not on file  Social Connections: Not on file  Intimate Partner Violence: Not on file    Family History  Problem Relation Age of Onset  . Cancer Maternal Aunt 60       breast cancer and NHL  . Cancer Maternal Grandfather        bone cancer  . Thrombosis Neg Hx      Immunization History  Administered Date(s) Administered  . Tdap 11/01/2019    Outpatient Encounter Medications as of 08/17/2020  Medication Sig  . apixaban (ELIQUIS) 5 MG TABS tablet TAKE 1 TABLET BY MOUTH TWICE DAILY  . cyanocobalamin (,VITAMIN B-12,) 1000 MCG/ML injection Inject 1 ml (1,000 mcg total) into the muscle once for 1 dose, inject weekly for 8 weeks then once every month as directed.  . Ferrous Sulfate (SLOW FE PO) Take by mouth.  . fluorouracil (EFUDEX) 5 % cream Apply topically 2 (two) times daily.  [name] levothyroxine (SYNTHROID) 25 MCG  tablet TAKE 1 TABLET (25 MCG TOTAL) BY MOUTH DAILY BEFORE BREAKFAST.  . SYRINGE-NEEDLE, DISP, 3 ML (B-D 3CC LUER-LOK SYR 25GX5/8") 25G X 5/8" 3 ML MISC use as directed with B12 injections (Patient taking differently: use as directed with B12 injections)  . triamcinolone cream (KENALOG) 0.1 % Apply 1 application topically 2 (two) times daily.  . predniSONE (DELTASONE) 20 MG tablet 3 tabs po daily x 2 days, 2 tabs po x 2 days, 1 tab po x 2 days, 1/2 tab po x 2 days   No facility-administered encounter medications on file as of 08/17/2020.     ROS: Pertinent positives and negatives noted in HPI. Remainder of ROS non-contributory   No Known Allergies  BP 140/90 (BP Location: Left Arm, Patient Position: Sitting, Cuff Size: Normal)   Pulse 75   Temp 97.9 F (36.6 C)  (Temporal)   Ht 5\' 4"  (1.626 m)   Wt 196 lb 12.8 oz (89.3 kg)   LMP 06/19/2020   SpO2 99%   BMI 33.78 kg/m   Wt Readings from Last 3 Encounters:  08/17/20 196 lb 12.8 oz (89.3 kg)  07/07/20 196 lb 1.9 oz (89 kg)  03/07/20 197 lb 12.8 oz (89.7 kg)   Temp Readings from Last 3 Encounters:  08/17/20 97.9 F (36.6 C) (Temporal)  07/22/20 97.9 F (36.6 C) (Oral)  07/12/20 97.8 F (36.6 C) (Oral)   BP Readings from Last 3 Encounters:  08/17/20 140/90  07/22/20 (!) 165/90  07/12/20 135/83   Pulse Readings from Last 3 Encounters:  08/17/20 75  07/22/20 64  07/12/20 75     Physical Exam Constitutional:      General: She is not in acute distress.    Appearance: She is not ill-appearing.  Cardiovascular:     Rate and Rhythm: Normal rate and regular rhythm.     Pulses: Normal pulses.  Pulmonary:     Effort: Pulmonary effort is normal.     Breath sounds: Normal breath sounds. No wheezing, rhonchi or rales.  Musculoskeletal:     Right lower leg: No edema.     Left lower leg: No edema.  Neurological:     Mental Status: She is alert and oriented to person, place, and time.  Psychiatric:        Mood and Affect: Mood normal.        Behavior: Behavior normal.      A/P:  1. Primary hypertension - uncontrolled, home readings 140-150s/80-90s Rx: - losartan (COZAAR) 25 MG tablet; Take 1 tablet (25 mg total) by mouth daily.  Dispense: 90 tablet; Refill: 1 - Basic metabolic panel - low salt diet  2. DOE (dyspnea on exertion) - ECHOCARDIOGRAM COMPLETE; Future - Ambulatory referral to Cardiology  3. Screening for lipid disorders - Lipid panel  4. Chest tightness - Ambulatory referral to Cardiology    This visit occurred during the SARS-CoV-2 public health emergency.  Safety protocols were in place, including screening questions prior to the visit, additional usage of staff PPE, and extensive cleaning of exam room while observing appropriate contact time as indicated for  disinfecting solutions.

## 2020-08-19 ENCOUNTER — Encounter: Payer: Self-pay | Admitting: Family Medicine

## 2020-08-19 ENCOUNTER — Other Ambulatory Visit: Payer: Self-pay

## 2020-08-19 ENCOUNTER — Ambulatory Visit (HOSPITAL_COMMUNITY): Payer: 59 | Attending: Cardiovascular Disease

## 2020-08-19 DIAGNOSIS — R0600 Dyspnea, unspecified: Secondary | ICD-10-CM | POA: Insufficient documentation

## 2020-08-19 DIAGNOSIS — R0609 Other forms of dyspnea: Secondary | ICD-10-CM

## 2020-08-19 LAB — ECHOCARDIOGRAM COMPLETE
Area-P 1/2: 3.21 cm2
S' Lateral: 3 cm

## 2020-08-22 NOTE — Telephone Encounter (Signed)
Please see message and advise.  Thank you. ° °

## 2020-08-29 NOTE — Progress Notes (Signed)
[place]   [phone] [phone]) [phone]   Clinic Follow up Note   Patient Care Team: Ronnald Nian, DO as PCP - General (Family Medicine)  Date of Service:  09/02/2020  CHIEF COMPLAINT: F/u of PEand iron deficient anemia   CURRENT THERAPY:  -IVFerahemeas neededsince 06/2018.Due to insurance she was switched to IV Venofer on 07/15/19. -oral iron and oral B12 -monthly B12 injection since 06/25/18. Changed to at home injections after 03/07/20.   INTERVAL HISTORY:  Rhonda Ford is here for a follow up of anemia. She was last seen by me 2 months ago. She presents to the clinic alone. She responded well to iv iron and felt better, but still has intermittent upper abdominal pain and dyspnea on exertion  She having left chest pain for past a few months, most after exertion, pain in left front chest and across to left back  Eating well, no weight loss  She had heavy menstrual period, last one started 7 days ago, lighter now    All other systems were reviewed with the patient and are negative.  MEDICAL HISTORY:  Past Medical History:  Diagnosis Date  . B12 deficiency   . Iron deficiency anemia due to chronic blood loss   . Melanoma ([place])   . Migraines   . Pulmonary emboli ([place]) 01/2018  . Skin cancer     SURGICAL HISTORY: Past Surgical History:  Procedure Laterality Date  . gyn surgeries    . melanoma surgery      leg    I have reviewed the social history and family history with the patient and they are unchanged from previous note.  ALLERGIES:  has No Known Allergies.  MEDICATIONS:  Current Outpatient Medications  Medication Sig Dispense Refill  . apixaban (ELIQUIS) 5 MG TABS tablet TAKE 1 TABLET BY MOUTH TWICE DAILY 60 tablet 4  . cyanocobalamin (,VITAMIN B-12,) 1000 MCG/ML injection Inject 1 ml (1,000 mcg total) into the muscle once for 1 dose, inject weekly for 8 weeks then once every month as directed. 4 mL 4  .  Ferrous Sulfate (SLOW FE PO) Take by mouth.    . fluorouracil (EFUDEX) 5 % cream Apply topically 2 (two) times daily.    [name] levothyroxine (SYNTHROID) 25 MCG tablet TAKE 1 TABLET (25 MCG TOTAL) BY MOUTH DAILY BEFORE BREAKFAST. 90 tablet 2  . losartan (COZAAR) 25 MG tablet Take 1 tablet (25 mg total) by mouth daily. 90 tablet 1  . predniSONE (DELTASONE) 20 MG tablet 3 tabs po daily x 2 days, 2 tabs po x 2 days, 1 tab po x 2 days, 1/2 tab po x 2 days 13 tablet 0  . SYRINGE-NEEDLE, DISP, 3 ML (B-D 3CC LUER-LOK SYR 25GX5/8") 25G X 5/8" 3 ML MISC use as directed with B12 injections (Patient taking differently: use as directed with B12 injections) 4 each 4  . triamcinolone cream (KENALOG) 0.1 % Apply 1 application topically 2 (two) times daily. 30 g 0   No current facility-administered medications for this visit.    PHYSICAL EXAMINATION: ECOG PERFORMANCE STATUS: 1 - Symptomatic but completely ambulatory  Vitals:   09/02/20 0806  BP: 124/73  Pulse: 80  Resp: 20  Temp: (!) 96.9 F (36.1 C)  SpO2: 100%   Filed Weights   09/02/20 0806  Weight: 200 lb 8 oz (90.9 kg)    GENERAL:alert, no distress and comfortable SKIN: skin color, texture, turgor are normal, no rashes or significant lesions EYES: normal, Conjunctiva  are pink and non-injected, sclera clear NECK: supple, thyroid normal size, non-tender, without nodularity LYMPH:  no palpable lymphadenopathy in the cervical, axillary  LUNGS: clear to auscultation and percussion with normal breathing effort HEART: regular rate & rhythm and no murmurs and no lower extremity edema ABDOMEN:abdomen soft, non-tender and normal bowel sounds Musculoskeletal:no cyanosis of digits and no clubbing  NEURO: alert & oriented x 3 with fluent speech, no focal motor/sensory deficits  LABORATORY DATA:  I have reviewed the data as listed CBC Latest Ref Rng & Units 09/02/2020 08/05/2020 07/22/2020  WBC 4.0 - 10.5 K/uL 5.8 4.2 4.4  Hemoglobin 12.0 - 15.0 g/dL  10.1(L) 12.2 11.2(L)  Hematocrit 36.0 - 46.0 % 31.1(L) 38.6 36.2  Platelets 150 - 400 K/uL 264 213 351     CMP Latest Ref Rng & Units 08/17/2020 11/21/2018 08/08/2018  Glucose 70 - 99 mg/dL 95 99 99  BUN 6 - 23 mg/dL 15 9 18   Creatinine 0.40 - 1.20 mg/dL 0.54 0.67 0.72  Sodium 135 - 145 mEq/L 140 140 139  Potassium 3.5 - 5.1 mEq/L 3.8 3.7 3.8  Chloride 96 - 112 mEq/L 105 107 107  CO2 19 - 32 mEq/L 26 24 24   Calcium 8.4 - 10.5 mg/dL 9.3 8.5(L) 8.5(L)  Total Protein 6.5 - 8.1 g/dL - 6.1(L) 6.8  Total Bilirubin 0.3 - 1.2 mg/dL - 0.2(L) 0.3  Alkaline Phos 38 - 126 U/L - 69 75  AST 15 - 41 U/L - 14(L) 11(L)  ALT 0 - 44 U/L - 15 15      RADIOGRAPHIC STUDIES: I have personally reviewed the radiological images as listed and agreed with the findings in the report. No results found.   ASSESSMENT & PLAN:  Rhonda Ford is a 50 y.o. female with   1. Iron deficiency anemia,B12 deficiency -Likely secondary to menorrhagia.Unclear the etiology about her B12 deficiency, her intrinsic factor was negative -Per patient she has been anemic ranging from 7.5-10 since she was an adolescent with chronic fatigue. This had not been worked up until her PE in late 2019. -Workup done by Dr. Walden Field in 06/2018 is consistent with iron deficiency anemia.  -Her 01/2018 colonoscopy and EGD which was normal except stomach ulcer, no H. Pylorie. Shehas recently stoppedPPI -She has had long history of menorrhagia which is the likely cause of her anemia. Since 04/2018 her periods have been irregular and occasionally lighter. She feels she may be perimenopausal. -She was on IV Feraheme as needed since 06/2018.Due to insurance she was switched to IV Venofer on 07/15/19.   -She also has B12 deficiencybased on lab in early 2020. She has had no gastric or GI surgeries.She is currently on monthly B12 injectionssince 3/2020but she missed many injections, she is also on oral B12.  -She has menstrual period that has  been ongoing daily for the last month, has not seen GYN for this -She required blood transfusion IV iron for severe anemia recently, responded well, anemia resolved after IV iron. -She developed anemia again, possibly from menstrual bleeding, and low iron again. -Will proceed with IV Venofer 400mg  weekly for 3 weeks in next 3-4 weeks   -Given her recurrent anemia, previous history of gastric ulcer, and nausea symptoms, I will refer her to [facility] for endoscopy.  We will obtain stool OB x3  2.H/oPulmonary Emboli, Unprovoked -Occurred in10/2019.No PE seen on 07/03/18 Angio CT chest. -She has been treated with Eliquis since3/2020. Given this was significant, unprovokedPEand no family history of PE,  it has been recommended tocontinue indefinitely. -Continue Eliquis, okay to hold 2 to 3 days during menstrual period due to menorrhagia.  3. Melanomaand Squamous cell carcinoma.  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 She is followed by dermatologywho she saw when she lived in [place]. -She has recent recurrence of squamous cell (2021). She is currently being treated with topical cream by Dermatologist.   4. Chest pain and upper abdominal pain -She was referred to cardiology by her primary care physician for her chest pain -She had a history of gastric ulcer in the past, was on Protonix, stopped due to interaction with oral iron.  I recommend her to restart -We will refer her to GI for endoscopy  -stool OB    PLAN: -Proceed with Venofer400mg  weekly X3 in next 3-4 weeks  -refer to [facility]  -stool OB X3 -lab in 2, 4 weeks -lab and f/u in 8 weeks    No problem-specific Assessment & Plan notes found for this encounter.   Orders Placed This Encounter  Procedures  . Occult blood card to lab, stool    Standing Status:   Future    Standing Expiration Date:   09/02/2021  . Occult blood card to lab, stool    Standing Status:   Future     Standing Expiration Date:   09/02/2021  . Occult blood card to lab, stool    Standing Status:   Future    Standing Expiration Date:   09/02/2021  . Ambulatory referral to Gastroenterology    Referral Priority:   Elective    Referral Type:   Consultation    Referral Reason:   Specialty Services Required    Number of Visits Requested:   1   All questions were answered. The patient knows to call the clinic with any problems, questions or concerns. No barriers to learning was detected. The total time spent in the appointment was 30 minutes.     Truitt Merle, MD 09/02/2020   I, Joslyn Devon, am acting as scribe for Truitt Merle, MD.   I have reviewed the above documentation for accuracy and completeness, and I agree with the above.

## 2020-09-01 ENCOUNTER — Other Ambulatory Visit: Payer: Self-pay

## 2020-09-01 DIAGNOSIS — D500 Iron deficiency anemia secondary to blood loss (chronic): Secondary | ICD-10-CM

## 2020-09-01 DIAGNOSIS — E538 Deficiency of other specified B group vitamins: Secondary | ICD-10-CM

## 2020-09-02 ENCOUNTER — Encounter: Payer: Self-pay | Admitting: Internal Medicine

## 2020-09-02 ENCOUNTER — Other Ambulatory Visit (HOSPITAL_COMMUNITY): Payer: Self-pay

## 2020-09-02 ENCOUNTER — Inpatient Hospital Stay: Payer: 59

## 2020-09-02 ENCOUNTER — Encounter: Payer: Self-pay | Admitting: Hematology

## 2020-09-02 ENCOUNTER — Other Ambulatory Visit: Payer: Self-pay

## 2020-09-02 ENCOUNTER — Inpatient Hospital Stay: Payer: 59 | Attending: Internal Medicine | Admitting: Hematology

## 2020-09-02 VITALS — BP 124/73 | HR 80 | Temp 96.9°F | Resp 20 | Ht 64.0 in | Wt 200.5 lb

## 2020-09-02 DIAGNOSIS — N920 Excessive and frequent menstruation with regular cycle: Secondary | ICD-10-CM | POA: Insufficient documentation

## 2020-09-02 DIAGNOSIS — Z7901 Long term (current) use of anticoagulants: Secondary | ICD-10-CM | POA: Diagnosis not present

## 2020-09-02 DIAGNOSIS — Z86711 Personal history of pulmonary embolism: Secondary | ICD-10-CM | POA: Insufficient documentation

## 2020-09-02 DIAGNOSIS — Z7952 Long term (current) use of systemic steroids: Secondary | ICD-10-CM | POA: Diagnosis not present

## 2020-09-02 DIAGNOSIS — R079 Chest pain, unspecified: Secondary | ICD-10-CM | POA: Insufficient documentation

## 2020-09-02 DIAGNOSIS — R5382 Chronic fatigue, unspecified: Secondary | ICD-10-CM | POA: Diagnosis not present

## 2020-09-02 DIAGNOSIS — D500 Iron deficiency anemia secondary to blood loss (chronic): Secondary | ICD-10-CM | POA: Diagnosis not present

## 2020-09-02 DIAGNOSIS — D509 Iron deficiency anemia, unspecified: Secondary | ICD-10-CM | POA: Diagnosis not present

## 2020-09-02 DIAGNOSIS — R0609 Other forms of dyspnea: Secondary | ICD-10-CM | POA: Insufficient documentation

## 2020-09-02 DIAGNOSIS — E538 Deficiency of other specified B group vitamins: Secondary | ICD-10-CM

## 2020-09-02 DIAGNOSIS — Z85820 Personal history of malignant melanoma of skin: Secondary | ICD-10-CM | POA: Diagnosis not present

## 2020-09-02 DIAGNOSIS — Z79899 Other long term (current) drug therapy: Secondary | ICD-10-CM | POA: Insufficient documentation

## 2020-09-02 DIAGNOSIS — R1010 Upper abdominal pain, unspecified: Secondary | ICD-10-CM | POA: Diagnosis not present

## 2020-09-02 LAB — RETIC PANEL
Immature Retic Fract: 32.2 % — ABNORMAL HIGH (ref 2.3–15.9)
RBC.: 3.3 MIL/uL — ABNORMAL LOW (ref 3.87–5.11)
Retic Count, Absolute: 99 10*3/uL (ref 19.0–186.0)
Retic Ct Pct: 3 % (ref 0.4–3.1)
Reticulocyte Hemoglobin: 35.1 pg (ref >27.9)

## 2020-09-02 LAB — CBC WITH DIFFERENTIAL (CANCER CENTER ONLY)
Abs Immature Granulocytes: 0.04 10*3/uL (ref 0.00–0.07)
Basophils Absolute: 0 10*3/uL (ref 0.0–0.1)
Basophils Relative: 0 %
Eosinophils Absolute: 0.1 10*3/uL (ref 0.0–0.5)
Eosinophils Relative: 2 %
HCT: 31.1 % — ABNORMAL LOW (ref 36.0–46.0)
Hemoglobin: 10.1 g/dL — ABNORMAL LOW (ref 12.0–15.0)
Immature Granulocytes: 1 %
Lymphocytes Relative: 22 %
Lymphs Abs: 1.3 10*3/uL (ref 0.7–4.0)
MCH: 30.3 pg (ref 26.0–34.0)
MCHC: 32.5 g/dL (ref 30.0–36.0)
MCV: 93.4 fL (ref 80.0–100.0)
Monocytes Absolute: 0.3 10*3/uL (ref 0.1–1.0)
Monocytes Relative: 5 %
Neutro Abs: 4.1 10*3/uL (ref 1.7–7.7)
Neutrophils Relative %: 70 %
Platelet Count: 264 10*3/uL (ref 150–400)
RBC: 3.33 MIL/uL — ABNORMAL LOW (ref 3.87–5.11)
RDW: 14.9 % (ref 11.5–15.5)
WBC Count: 5.8 10*3/uL (ref 4.0–10.5)
nRBC: 0 % (ref 0.0–0.2)

## 2020-09-02 LAB — IRON AND TIBC
Iron: 59 ug/dL (ref 41–142)
Saturation Ratios: 22 % (ref 21–57)
TIBC: 261 ug/dL (ref 236–444)
UIBC: 203 ug/dL (ref 120–384)

## 2020-09-02 LAB — VITAMIN B12: Vitamin B-12: 342 pg/mL (ref 180–914)

## 2020-09-02 LAB — FERRITIN: Ferritin: 60 ng/mL (ref 11–307)

## 2020-09-02 MED ORDER — FLUOROURACIL 5 % EX CREA
TOPICAL_CREAM | CUTANEOUS | 1 refills | Status: DC
  Filled 2020-09-02: qty 40, 30d supply, fill #0

## 2020-09-02 MED FILL — Apixaban Tab 5 MG: ORAL | 30 days supply | Qty: 60 | Fill #1 | Status: AC

## 2020-09-07 ENCOUNTER — Telehealth: Payer: Self-pay | Admitting: Hematology

## 2020-09-07 NOTE — Telephone Encounter (Signed)
Scheduled follow-up appointment per 5/27 los and informed patient of possibility of scheduling infusions at different location. Patient is aware.

## 2020-09-07 NOTE — Telephone Encounter (Signed)
Called to inform patient of upcoming appointments per 5/27 los. Patient is aware.

## 2020-09-11 IMAGING — CT CT ANGIOGRAPHY CHEST
2 of 7 series · 18 of 46 positions shown · IV contrast (OMNIPAQUE)
Comparison: None.

CLINICAL DATA: Shortness of breath. History of pulmonary embolus.
History of melanoma

EXAM:
CT ANGIOGRAPHY CHEST WITH CONTRAST
TECHNIQUE: Multidetector CT imaging of the chest was performed using the
standard protocol during bolus administration of intravenous
contrast. Multiplanar CT image reconstructions and MIPs were
obtained to evaluate the vascular anatomy.
CONTRAST:  58mL OMNIPAQUE IOHEXOL 350 MG/ML SOLN

[Series 5: thins · axial · 0.68mm/px · z∈[+81,+316]mm · 16 of 267 slices shown]
[im 16/267  lung]
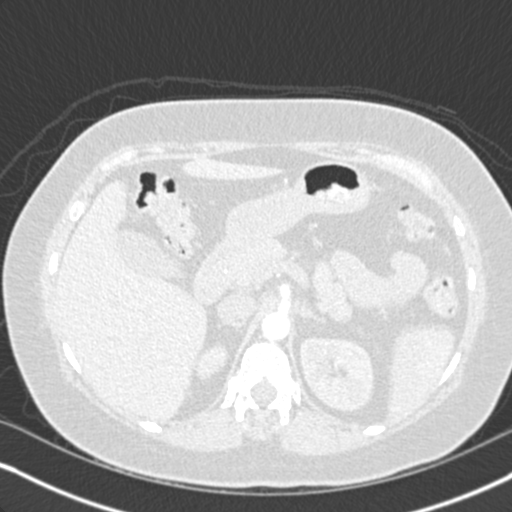
[im 32/267  soft-tissue]
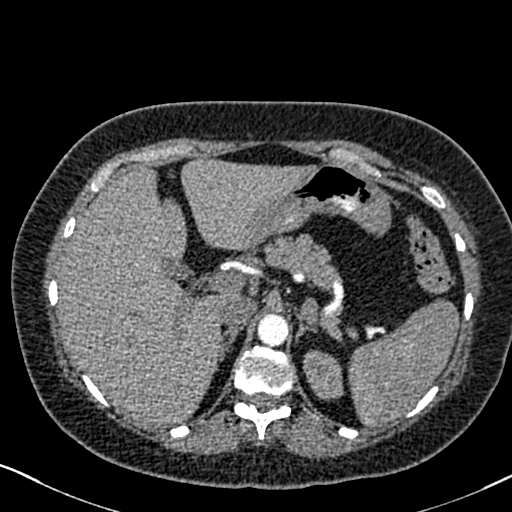
[im 47/267  lung]
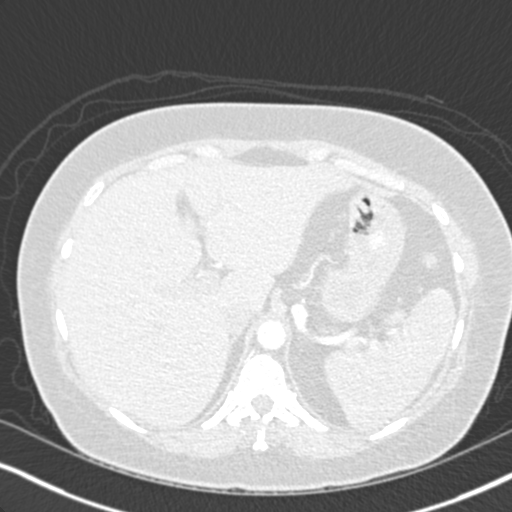
[im 63/267  soft-tissue]
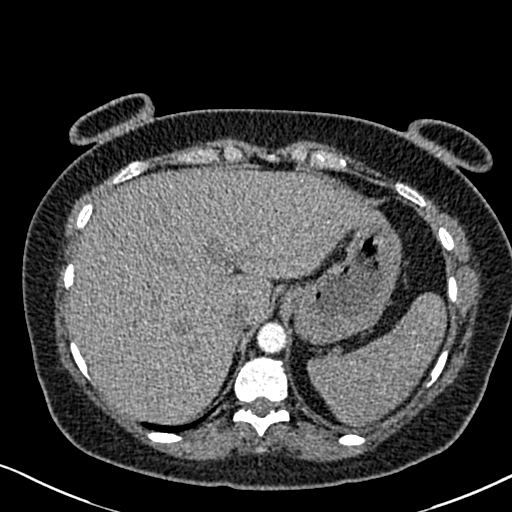
[im 79/267  lung]
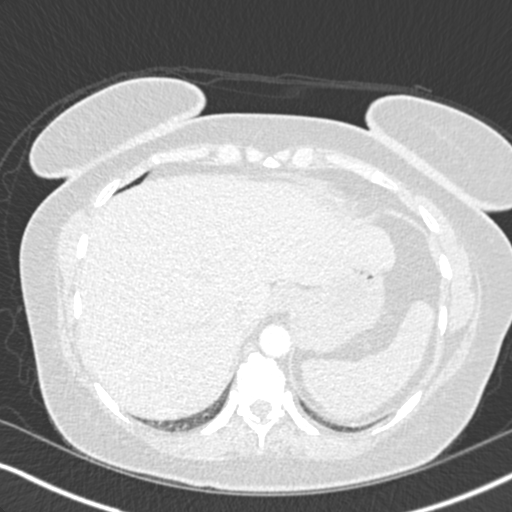
[im 94/267  soft-tissue]
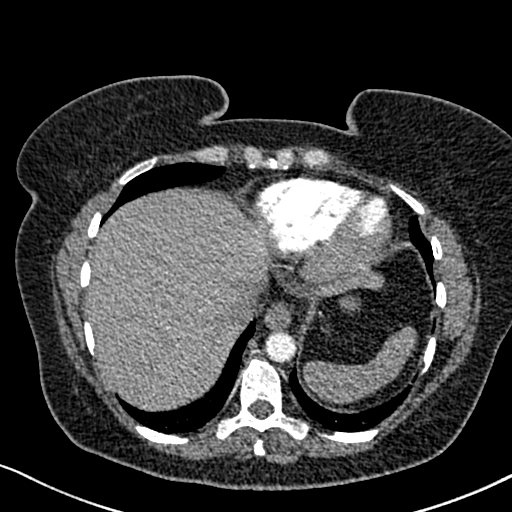
[im 110/267  lung]
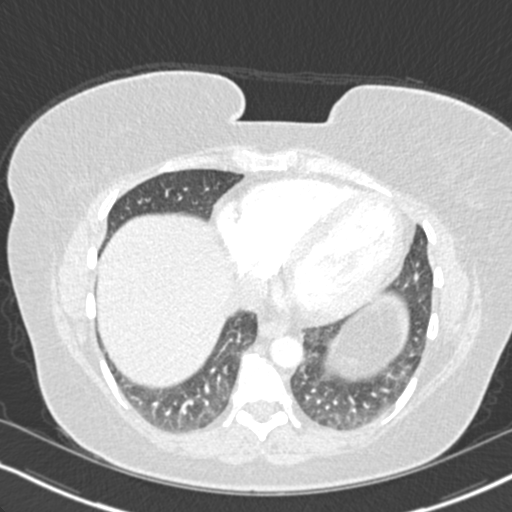
[im 126/267  soft-tissue]
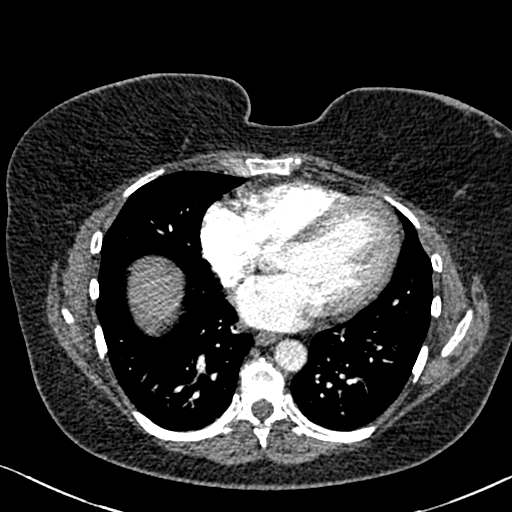
[im 141/267  lung]
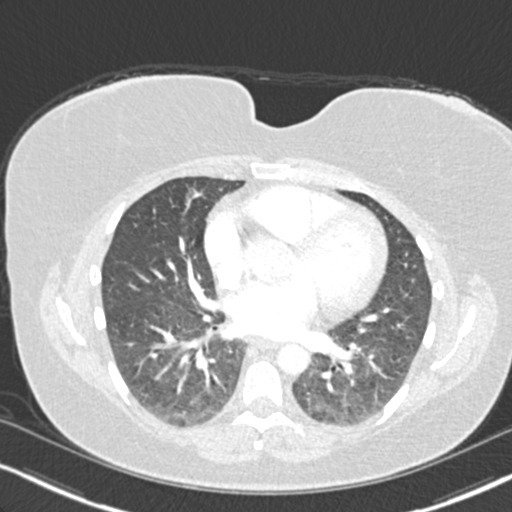
[im 157/267  soft-tissue]
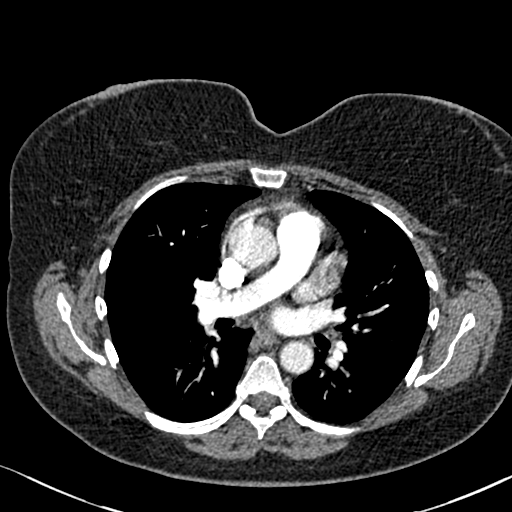
[im 173/267  lung]
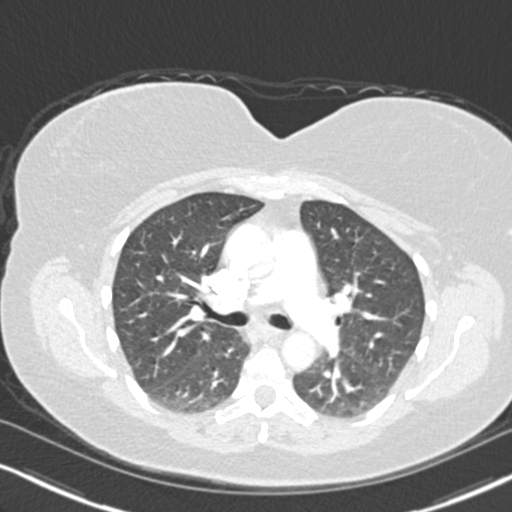
[im 188/267  soft-tissue]
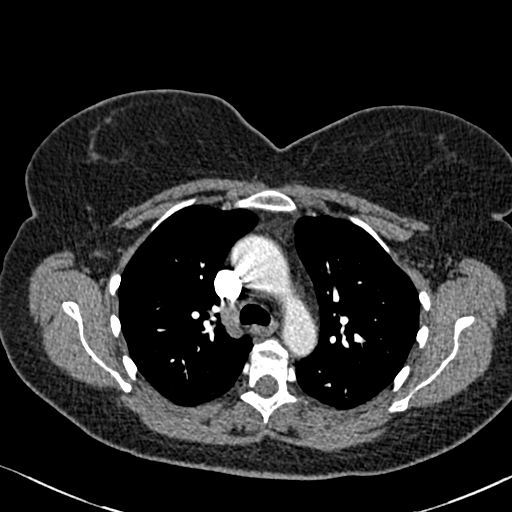
[im 204/267  lung]
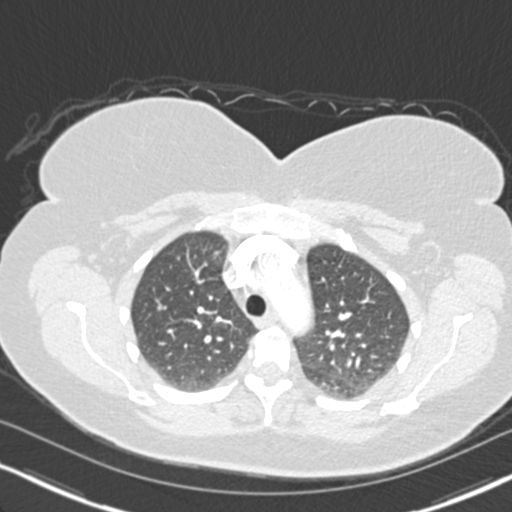
[im 220/267  soft-tissue]
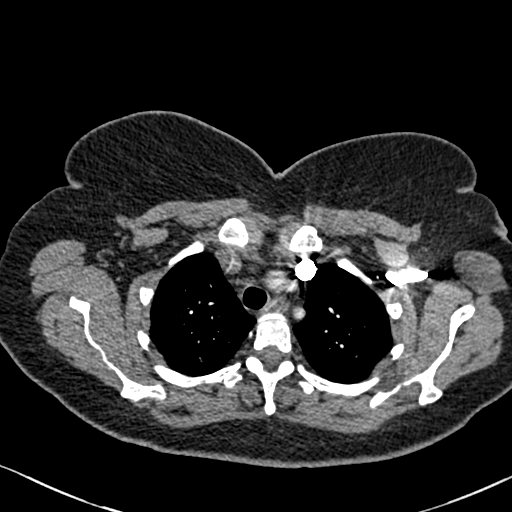
[im 235/267  lung]
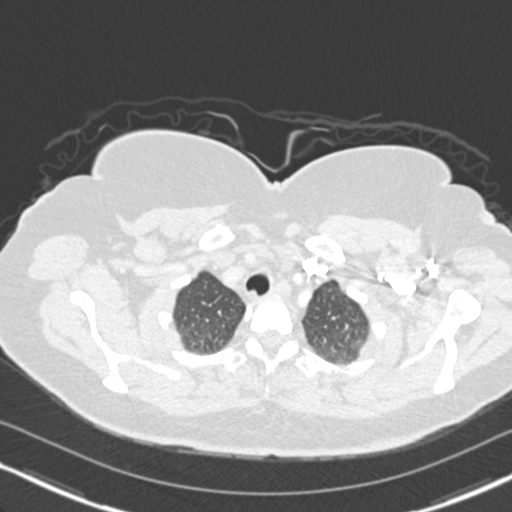
[im 251/267  soft-tissue]
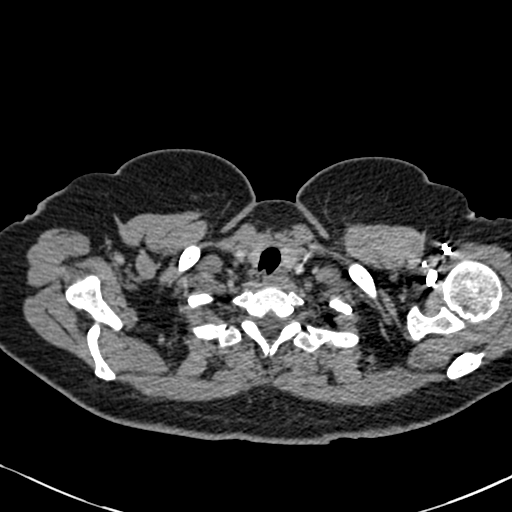

[Series 6: coronal mpr · coronal · 0.59mm/px · 2 of 73 slices shown]
[im 25/73  soft-tissue]
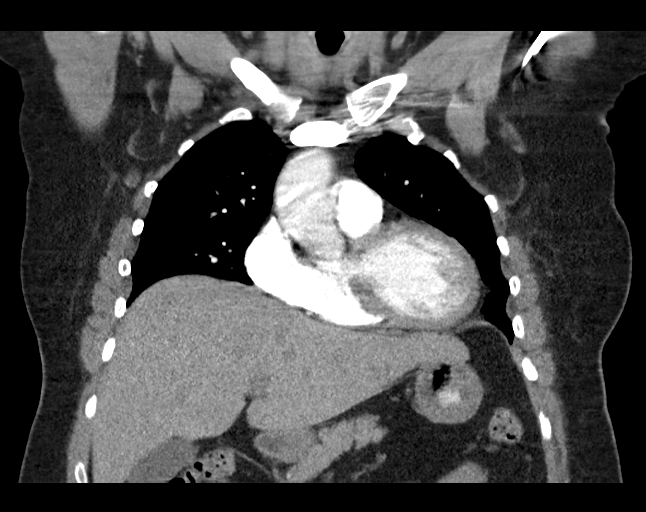
[im 49/73  soft-tissue]
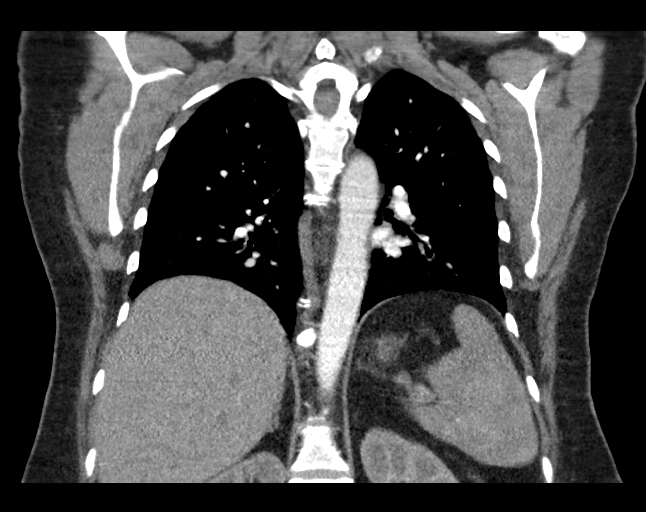

[18 of 46 positions shown; findings below may reference images not displayed]

FINDINGS: Cardiovascular: There is no demonstrable pulmonary embolus. There is
no thoracic aortic aneurysm or dissection. The visualized great
vessels appear unremarkable. There is no pericardial effusion or
pericardial thickening.

Mediastinum/Nodes: Visualized thyroid appears unremarkable. There is
no thoracic adenopathy. No esophageal lesions are evident.

Lungs/Pleura: There is no edema or consolidation. No pleural
effusion or pleural thickening evident.

Upper Abdomen: Visualized upper abdominal structures appear
unremarkable.

Musculoskeletal: There are no blastic or lytic bone lesions. No
chest wall lesions are appreciable.

Review of the MIP images confirms the above findings.
IMPRESSION: 1. No demonstrable pulmonary embolus. No thoracic aortic aneurysm or
dissection.

2.  Lungs clear.

3. No demonstrable thoracic adenopathy. No evident neoplastic focus
in the chest.

## 2020-09-16 ENCOUNTER — Other Ambulatory Visit: Payer: Self-pay

## 2020-09-16 ENCOUNTER — Inpatient Hospital Stay: Payer: 59

## 2020-09-16 ENCOUNTER — Inpatient Hospital Stay: Payer: 59 | Attending: Internal Medicine

## 2020-09-16 VITALS — BP 134/91 | HR 80 | Temp 98.2°F | Resp 20

## 2020-09-16 DIAGNOSIS — D500 Iron deficiency anemia secondary to blood loss (chronic): Secondary | ICD-10-CM

## 2020-09-16 DIAGNOSIS — D509 Iron deficiency anemia, unspecified: Secondary | ICD-10-CM | POA: Insufficient documentation

## 2020-09-16 DIAGNOSIS — E538 Deficiency of other specified B group vitamins: Secondary | ICD-10-CM

## 2020-09-16 LAB — CBC WITH DIFFERENTIAL (CANCER CENTER ONLY)
Abs Immature Granulocytes: 0.04 10*3/uL (ref 0.00–0.07)
Basophils Absolute: 0 10*3/uL (ref 0.0–0.1)
Basophils Relative: 1 %
Eosinophils Absolute: 0.1 10*3/uL (ref 0.0–0.5)
Eosinophils Relative: 2 %
HCT: 36.3 % (ref 36.0–46.0)
Hemoglobin: 11.9 g/dL — ABNORMAL LOW (ref 12.0–15.0)
Immature Granulocytes: 1 %
Lymphocytes Relative: 26 %
Lymphs Abs: 1.3 10*3/uL (ref 0.7–4.0)
MCH: 29.6 pg (ref 26.0–34.0)
MCHC: 32.8 g/dL (ref 30.0–36.0)
MCV: 90.3 fL (ref 80.0–100.0)
Monocytes Absolute: 0.4 10*3/uL (ref 0.1–1.0)
Monocytes Relative: 8 %
Neutro Abs: 3.3 10*3/uL (ref 1.7–7.7)
Neutrophils Relative %: 62 %
Platelet Count: 304 10*3/uL (ref 150–400)
RBC: 4.02 MIL/uL (ref 3.87–5.11)
RDW: 14.4 % (ref 11.5–15.5)
WBC Count: 5.2 10*3/uL (ref 4.0–10.5)
nRBC: 0 % (ref 0.0–0.2)

## 2020-09-16 LAB — RETIC PANEL
Immature Retic Fract: 22.6 % — ABNORMAL HIGH (ref 2.3–15.9)
RBC.: 3.94 MIL/uL (ref 3.87–5.11)
Retic Count, Absolute: 112.7 10*3/uL (ref 19.0–186.0)
Retic Ct Pct: 2.9 % (ref 0.4–3.1)
Reticulocyte Hemoglobin: 32.9 pg (ref >27.9)

## 2020-09-16 LAB — VITAMIN B12: Vitamin B-12: 306 pg/mL (ref 180–914)

## 2020-09-16 MED ORDER — SODIUM CHLORIDE 0.9 % IV SOLN
400.0000 mg | Freq: Once | INTRAVENOUS | Status: AC
  Administered 2020-09-16: 400 mg via INTRAVENOUS
  Filled 2020-09-16: qty 20

## 2020-09-16 MED ORDER — SODIUM CHLORIDE 0.9 % IV SOLN
Freq: Once | INTRAVENOUS | Status: AC
Start: 2020-09-16 — End: 2020-09-16
  Filled 2020-09-16: qty 250

## 2020-09-16 NOTE — Patient Instructions (Signed)

## 2020-09-19 ENCOUNTER — Telehealth: Payer: Self-pay | Admitting: Hematology

## 2020-09-19 ENCOUNTER — Encounter: Payer: Self-pay | Admitting: Hematology

## 2020-09-19 ENCOUNTER — Telehealth: Payer: Self-pay

## 2020-09-19 LAB — IRON AND TIBC
Iron: 64 ug/dL (ref 41–142)
Saturation Ratios: 21 % (ref 21–57)
TIBC: 309 ug/dL (ref 236–444)
UIBC: 245 ug/dL (ref 120–384)

## 2020-09-19 LAB — FERRITIN: Ferritin: 46 ng/mL (ref 11–307)

## 2020-09-19 NOTE — Telephone Encounter (Signed)
This nurse spoke with patient and made aware of Dr. Lewayne Bunting recommendations.  Patient is agreement with receiving injections.  Made this nurse aware that she needs to reschedule 6/17 appts related to going out of town.  This nurse advised the schedulers will reach out to her to move the appts.  Patient knows to call clinic with any problems, questons or concerns.

## 2020-09-19 NOTE — Telephone Encounter (Signed)
Cancelled appointment per patient request, she will be out of town. Will call back to rescheduled.

## 2020-09-19 NOTE — Telephone Encounter (Signed)
-----   Message from Truitt Merle, MD sent at 09/16/2020 11:24 PM EDT ----- Please let pt know her lab results, and add B12 injection on next appointment in a week and July, thanks   Truitt Merle  09/16/2020

## 2020-09-21 DIAGNOSIS — Z08 Encounter for follow-up examination after completed treatment for malignant neoplasm: Secondary | ICD-10-CM | POA: Diagnosis not present

## 2020-09-21 DIAGNOSIS — L821 Other seborrheic keratosis: Secondary | ICD-10-CM | POA: Diagnosis not present

## 2020-09-21 DIAGNOSIS — Z1283 Encounter for screening for malignant neoplasm of skin: Secondary | ICD-10-CM | POA: Diagnosis not present

## 2020-09-21 DIAGNOSIS — Z85820 Personal history of malignant melanoma of skin: Secondary | ICD-10-CM | POA: Diagnosis not present

## 2020-09-23 ENCOUNTER — Ambulatory Visit: Payer: 59

## 2020-09-28 ENCOUNTER — Inpatient Hospital Stay: Payer: 59

## 2020-09-28 ENCOUNTER — Other Ambulatory Visit: Payer: Self-pay

## 2020-09-28 VITALS — BP 128/63 | HR 70 | Temp 98.1°F | Resp 16

## 2020-09-28 DIAGNOSIS — D500 Iron deficiency anemia secondary to blood loss (chronic): Secondary | ICD-10-CM

## 2020-09-28 DIAGNOSIS — D509 Iron deficiency anemia, unspecified: Secondary | ICD-10-CM | POA: Diagnosis not present

## 2020-09-28 DIAGNOSIS — E538 Deficiency of other specified B group vitamins: Secondary | ICD-10-CM

## 2020-09-28 LAB — CBC WITH DIFFERENTIAL (CANCER CENTER ONLY)
Abs Immature Granulocytes: 0.07 10*3/uL (ref 0.00–0.07)
Basophils Absolute: 0 10*3/uL (ref 0.0–0.1)
Basophils Relative: 0 %
Eosinophils Absolute: 0.1 10*3/uL (ref 0.0–0.5)
Eosinophils Relative: 2 %
HCT: 36.4 % (ref 36.0–46.0)
Hemoglobin: 12 g/dL (ref 12.0–15.0)
Immature Granulocytes: 1 %
Lymphocytes Relative: 19 %
Lymphs Abs: 1.4 10*3/uL (ref 0.7–4.0)
MCH: 30.4 pg (ref 26.0–34.0)
MCHC: 33 g/dL (ref 30.0–36.0)
MCV: 92.2 fL (ref 80.0–100.0)
Monocytes Absolute: 0.5 10*3/uL (ref 0.1–1.0)
Monocytes Relative: 7 %
Neutro Abs: 5.1 10*3/uL (ref 1.7–7.7)
Neutrophils Relative %: 71 %
Platelet Count: 256 10*3/uL (ref 150–400)
RBC: 3.95 MIL/uL (ref 3.87–5.11)
RDW: 14.7 % (ref 11.5–15.5)
WBC Count: 7.1 10*3/uL (ref 4.0–10.5)
nRBC: 0 % (ref 0.0–0.2)

## 2020-09-28 LAB — RETIC PANEL
Immature Retic Fract: 18 % — ABNORMAL HIGH (ref 2.3–15.9)
RBC.: 3.94 MIL/uL (ref 3.87–5.11)
Retic Count, Absolute: 109.1 10*3/uL (ref 19.0–186.0)
Retic Ct Pct: 2.8 % (ref 0.4–3.1)
Reticulocyte Hemoglobin: 35.4 pg (ref >27.9)

## 2020-09-28 LAB — VITAMIN B12: Vitamin B-12: 295 pg/mL (ref 180–914)

## 2020-09-28 MED ORDER — CYANOCOBALAMIN 1000 MCG/ML IJ SOLN
INTRAMUSCULAR | Status: AC
  Filled 2020-09-28: qty 1

## 2020-09-28 MED ORDER — CYANOCOBALAMIN 1000 MCG/ML IJ SOLN: 1000.0000 ug | Freq: Once | INTRAMUSCULAR | Status: DC

## 2020-09-28 MED ORDER — SODIUM CHLORIDE 0.9 % IV SOLN
INTRAVENOUS | Status: DC
  Filled 2020-09-28 (×2): qty 250

## 2020-09-28 MED ORDER — SODIUM CHLORIDE 0.9 % IV SOLN
400.0000 mg | Freq: Once | INTRAVENOUS | Status: AC
  Administered 2020-09-28: 400 mg via INTRAVENOUS
  Filled 2020-09-28: qty 10

## 2020-09-28 NOTE — Patient Instructions (Signed)

## 2020-09-28 NOTE — Progress Notes (Signed)
Pt. refused to wait 30 min post infusion. Released stable and ASX. 

## 2020-09-29 LAB — IRON AND TIBC
Iron: 85 ug/dL (ref 41–142)
Saturation Ratios: 29 % (ref 21–57)
TIBC: 291 ug/dL (ref 236–444)
UIBC: 206 ug/dL (ref 120–384)

## 2020-09-29 LAB — FERRITIN: Ferritin: 133 ng/mL (ref 11–307)

## 2020-10-05 ENCOUNTER — Ambulatory Visit: Payer: 59

## 2020-10-08 ENCOUNTER — Encounter: Payer: Self-pay | Admitting: Hematology

## 2020-10-16 NOTE — Progress Notes (Signed)
Cardiology Office Note:   Date:  10/19/2020  NAME:  Rhonda Ford    MRN: [id number] DOB:  [birth year]   PCP:  Ronnald Nian, DO  Cardiologist:  None  Electrophysiologist:  None   Referring MD: Ronnald Nian, DO   Chief Complaint  Patient presents with   Chest Pain    History of Present Illness:   Rhonda Ford is a 50 y.o. female with a hx of IDA, HTN who is being seen today for the evaluation of chest pain/SOB at the request of Ronnald Nian, DO. Echo by PCP normal.  She reports for the last 8 months she is become short of breath.  This occurs with activity.  She also describes low energy.  She has recently been diagnosed with hypertension.  She was started on medication and BP is well controlled.  She reports that she is always short of breath when she is anemic.  She does suffer from iron deficiency anemia.  Most recent hemoglobin value normal.  Her primary care physician wanted a full cardiac work-up.  Echocardiogram was normal.  She does snore.  She is tired all the time.  She may have sleep apnea.  She also reports symptoms of chest pain.  They occur intermittently.  She describes an episode several months ago when she fell asleep with tightness in her chest.  She can also periodically have tightness in her left chest with activity.  Can also get pressure.  Can occur with activity or at rest.  She does work as a Marine scientist at [employer].  She has 5 children.  She still has 4 teenagers in the home.  She reports significant stress.  3 of her children will enlist in the [employer] in the next few months.  She reports this is stressful as well.  She had a CT scan in 2020 that showed no coronary calcium.  She is a great candidate for coronary CTA.  She reports that she is concerned she may have heart disease.  I have reassured her that this is likely not her heart.  Her EKG shows normal sinus rhythm with no acute ischemic changes or evidence of infarction.  She is a never  smoker.  She does not drink alcohol.  No drug use is reported.  She is divorced.  She reports no more stress than a mother of 5 children.  She does have hypothyroidism and takes Synthroid.  Needs an updated TSH.  She overall appears to be doing well.  She is just concerned about her intermittent symptoms that have occurred for the last few months.  She does have a family history of heart disease in her mother and grandmother on mom side.  Father side is more consistent with cancer.  CT PE Study 07/03/2018 -> 0 CAC T chol 201, HDL 63, LDL 125, TG 69  Problem List Iron Deficiency anemia  HTN PE on AC  Past Medical History: Past Medical History:  Diagnosis Date   B12 deficiency    Iron deficiency anemia due to chronic blood loss    Melanoma (HCC)    Migraines    Pulmonary emboli (HCC) 01/2018   Skin cancer     Past Surgical History: Past Surgical History:  Procedure Laterality Date   gyn surgeries     melanoma surgery      leg    Current Medications: Current Meds  Medication Sig   apixaban (ELIQUIS) 5 MG TABS tablet TAKE 1 TABLET BY  MOUTH TWICE DAILY   cyanocobalamin (,VITAMIN B-12,) 1000 MCG/ML injection Inject 1 ml (1,000 mcg total) into the muscle once for 1 dose, inject weekly for 8 weeks then once every month as directed.   Ferrous Sulfate (SLOW FE PO) Take by mouth.   fluorouracil (EFUDEX) 5 % cream Apply topically 2 (two) times daily.   levothyroxine (SYNTHROID) 25 MCG tablet TAKE 1 TABLET (25 MCG TOTAL) BY MOUTH DAILY BEFORE BREAKFAST.   losartan (COZAAR) 25 MG tablet Take 1 tablet (25 mg total) by mouth daily.   metoprolol tartrate (LOPRESSOR) 100 MG tablet Take 1 tablet by mouth once for procedure.   SYRINGE-NEEDLE, DISP, 3 ML (B-D 3CC LUER-LOK SYR 25GX5/8") 25G X 5/8" 3 ML MISC use as directed with B12 injections (Patient taking differently: use as directed with B12 injections)   [DISCONTINUED] fluorouracil (EFUDEX) 5 % cream Apply to affected areas 2 times a day for 2  weeks     Allergies:    Patient has no known allergies.   Social History: Social History   Socioeconomic History   Marital status: Divorced    Spouse name: Not on file   Number of children: 4   Years of education: Not on file   Highest education level: Not on file  Occupational History   Occupation: nurse   Tobacco Use   Smoking status: Never   Smokeless tobacco: Never  Vaping Use   Vaping Use: Never used  Substance and Sexual Activity   Alcohol use: Not Currently   Drug use: Not Currently   Sexual activity: Not on file  Other Topics Concern   Not on file  Social History Narrative   Works as a Marine scientist    Social Determinants of Radio broadcast assistant Strain: Not on Art therapist Insecurity: Not on file  Transportation Needs: Not on file  Physical Activity: Not on file  Stress: Not on file  Social Connections: Not on file     Family History: The patient's family history includes Cancer in her maternal grandfather; Cancer (age of onset: 73) in her maternal aunt; Heart attack in her maternal grandmother; Heart disease in her mother. There is no history of Thrombosis.  ROS:   All other ROS reviewed and negative. Pertinent positives noted in the HPI.     EKGs/Labs/Other Studies Reviewed:   The following studies were personally reviewed by me today:  EKG:  EKG is ordered today.  The ekg ordered today demonstrates normal sinus rhythm heart rate 82, LVH by voltage, and was personally reviewed by me.   TTE 08/19/2020    1. Left ventricular ejection fraction, by estimation, is 60 to 65%. The  left ventricle has normal function. The left ventricle has no regional  wall motion abnormalities. Left ventricular diastolic parameters were  normal.   2. Right ventricular systolic function is normal. The right ventricular  size is normal. There is normal pulmonary artery systolic pressure.   3. The mitral valve is normal in structure. Trivial mitral valve  regurgitation. No  evidence of mitral stenosis.   4. The aortic valve is tricuspid. Aortic valve regurgitation is not  visualized. No aortic stenosis is present.   5. The inferior vena cava is normal in size with greater than 50%  respiratory variability, suggesting right atrial pressure of 3 mmHg.  Recent Labs: 08/17/2020: BUN 15; Creatinine, Ser 0.54; Potassium 3.8; Sodium 140 09/28/2020: Hemoglobin 12.0; Platelet Count 256   Recent Lipid Panel    Component Value Date/Time  CHOL 201 (H) 08/17/2020 1122   TRIG 69.0 08/17/2020 1122   HDL 62.30 08/17/2020 1122   CHOLHDL 3 08/17/2020 1122   VLDL 13.8 08/17/2020 1122   LDLCALC 125 (H) 08/17/2020 1122    Physical Exam:   VS:  BP 118/86   Pulse 82   Resp 18   Ht 5\' 3"  (1.6 m)   Wt 202 lb 3.2 oz (91.7 kg)   SpO2 99%   BMI 35.82 kg/m    Wt Readings from Last 3 Encounters:  10/19/20 202 lb 3.2 oz (91.7 kg)  09/02/20 200 lb 8 oz (90.9 kg)  08/17/20 196 lb 12.8 oz (89.3 kg)    General: Well nourished, well developed, in no acute distress Head: Atraumatic, normal size  Eyes: PEERLA, EOMI  Neck: Supple, no JVD Endocrine: No thryomegaly Cardiac: Normal S1, S2; RRR; no murmurs, rubs, or gallops Lungs: Clear to auscultation bilaterally, no wheezing, rhonchi or rales  Abd: Soft, nontender, no hepatomegaly  Ext: No edema, pulses 2+ Musculoskeletal: No deformities, BUE and BLE strength normal and equal Skin: Warm and dry, no rashes   Neuro: Alert and oriented to person, place, time, and situation, CNII-XII grossly intact, no focal deficits  Psych: Normal mood and affect   ASSESSMENT:   Rhonda Ford is a 50 y.o. female who presents for the following: 1. Chest pain, unspecified type   2. SOB (shortness of breath) on exertion   3. Obesity (BMI 30-39.9)   4. Snoring   5. Chronic fatigue     PLAN:   1. Chest pain, unspecified type 2. SOB (shortness of breath) on exertion -Atypical chest pain.  Occurs intermittently.  Can occur with  exertion or at rest.  Described as sharp.  EKG with no ischemic changes or evidence of infarction.  She reports low energy and fatigue.  Her symptoms could be related sleep apnea.  I recommended a home sleep study.  She also needs updated TSH and free T4.  Recent echocardiogram normal.  Cardiovascular examination normal without murmurs rubs or gallops.  I would like to check a BNP to effectively exclude heart failure.  Her chest pain is atypical.  I would like to proceed with a coronary CTA.  We will check a BMP today.  She will take 100 mg of metoprolol tartrate 2 hours before the scan.  Given that she is a young woman I like to proceed with a flash protocol if we are able to.  I think she would be a great candidate for this.  This will be the effective radiation dose of a chest x-ray.  Overall I have a low suspicion for coronary disease.  We will exclude this.  I think sleep apnea or thyroid could be the bigger issue here.  She has no evidence of heart failure.  3. Obesity (BMI 30-39.9) -Diet and exercise recommended.  4. Snoring 5. Chronic fatigue -Home sleep study recommended.  She snores.  She falls asleep easily.  She is tired.  She screens positive for this.  Disposition: Return if symptoms worsen or fail to improve.  Medication Adjustments/Labs and Tests Ordered: Current medicines are reviewed at length with the patient today.  Concerns regarding medicines are outlined above.  Orders Placed This Encounter  Procedures   CT CORONARY MORPH W/CTA COR W/SCORE W/CA W/CM &/OR WO/CM   Basic metabolic panel   TSH   T4, free   Brain natriuretic peptide   EKG 12-Lead   Home sleep test  Meds ordered this encounter  Medications   metoprolol tartrate (LOPRESSOR) 100 MG tablet    Sig: Take 1 tablet by mouth once for procedure.    Dispense:  1 tablet    Refill:  0     Patient Instructions  Medication Instructions:  Take Metoprolol 100 mg two hours before the CT when scheduled.   *If  you need a refill on your cardiac medications before your next appointment, please call your pharmacy*   Lab Work: TSH, Free T4, BNP, BMET today   If you have labs (blood work) drawn today and your tests are completely normal, you will receive your results only by: [place] (if you have MyChart) OR A paper copy in the mail If you have any lab test that is abnormal or we need to change your treatment, we will call you to review the results.   Testing/Procedures: Your physician has requested that you have cardiac CT. Cardiac computed tomography (CT) is a painless test that uses an x-ray machine to take clear, detailed pictures of your heart. For further information please visit [email or web address].[email or web address]. Please follow instruction sheet as given.  Your physician has recommended that you have a sleep study. This test records several body functions during sleep, including: brain activity, eye movement, oxygen and carbon dioxide blood levels, heart rate and rhythm, breathing rate and rhythm, the flow of air through your mouth and nose, snoring, body muscle movements, and chest and belly movement.    Follow-Up: At [facility], you and your health needs are our priority.  As part of our continuing mission to provide you with exceptional heart care, we have created designated Provider Care Teams.  These Care Teams include your primary Cardiologist (physician) and Advanced Practice Providers (APPs -  Physician Assistants and Nurse Practitioners) who all work together to provide you with the care you need, when you need it.  We recommend signing up for the patient portal called "MyChart".  Sign up information is provided on this After Visit Summary.  MyChart is used to connect with patients for Virtual Visits (Telemedicine).  Patients are able to view lab/test results, encounter notes, upcoming appointments, etc.  Non-urgent messages can be sent to your provider as well.   To learn more about what  you can do with MyChart, go to [email or web address].    Your next appointment:   As needed  The format for your next appointment:   In Person  Provider:   Eleonore Chiquito, MD   Other Instructions   Your cardiac CT will be scheduled at one of the below locations:   [facility] [address] [place], [place] [address] [phone]  [place] [address] [place], [place] [address] [phone]  If scheduled at [facility], please arrive at the [facility] main entrance (entrance A) of [facility] 30 minutes prior to test start time. Proceed to the [facility] Radiology Department (first floor) to check-in and test prep.  If scheduled at [facility], please arrive 15 mins early for check-in and test prep.  Please follow these instructions carefully (unless otherwise directed):  Hold all erectile dysfunction medications at least 3 days (72 hrs) prior to test.  On the Night Before the Test: Be sure to Drink plenty of water. Do not consume any caffeinated/decaffeinated beverages or chocolate 12 hours prior to your test. Do not take any antihistamines 12 hours prior to your test.  On the Day of the Test: Drink plenty of water until 1 hour prior to the test. Do not eat any food 4 hours prior to the test. You may take your regular medications prior to the test.  Take metoprolol (Lopressor) two hours prior to test. HOLD Furosemide/Hydrochlorothiazide morning of the test. FEMALES- please wear underwire-free bra if available, avoid dresses & tight clothing    After the Test: Drink plenty of water. After receiving IV contrast, you may experience a mild flushed feeling. This is normal. On occasion, you may experience a mild rash up to 24 hours after the test. This is not dangerous. If this occurs, you can take Benadryl 25 mg and increase your fluid intake. If you  experience trouble breathing, this can be serious. If it is severe call 911 IMMEDIATELY. If it is mild, please call our office. If you take any of these medications: Glipizide/Metformin, Avandament, Glucavance, please do not take 48 hours after completing test unless otherwise instructed.   Once we have confirmed authorization from your insurance company, we will call you to set up a date and time for your test. Based on how quickly your insurance processes prior authorizations requests, please allow up to 4 weeks to be contacted for scheduling your Cardiac CT appointment. Be advised that routine Cardiac CT appointments could be scheduled as many as 8 weeks after your provider has ordered it.  For non-scheduling related questions, please contact the cardiac imaging nurse navigator should you have any questions/concerns: Marchia Bond, Cardiac Imaging Nurse Navigator Gordy Clement, Cardiac Imaging Nurse Navigator  Heart and Vascular Services Direct Office Dial: [phone]   For scheduling needs, including cancellations and rescheduling, please call [place], [phone].    Signed, Addison Naegeli. Audie Box, MD, [place]  [address], [place], [place] [address] [phone]  10/19/2020 9:10 AM

## 2020-10-19 ENCOUNTER — Other Ambulatory Visit (HOSPITAL_COMMUNITY): Payer: Self-pay

## 2020-10-19 ENCOUNTER — Encounter: Payer: Self-pay | Admitting: Internal Medicine

## 2020-10-19 ENCOUNTER — Encounter: Payer: Self-pay | Admitting: Cardiovascular Disease

## 2020-10-19 ENCOUNTER — Ambulatory Visit (INDEPENDENT_AMBULATORY_CARE_PROVIDER_SITE_OTHER): Payer: 59 | Admitting: Cardiovascular Disease

## 2020-10-19 ENCOUNTER — Other Ambulatory Visit: Payer: Self-pay

## 2020-10-19 ENCOUNTER — Other Ambulatory Visit: Payer: Self-pay | Admitting: Hematology

## 2020-10-19 VITALS — BP 118/86 | HR 82 | Resp 18 | Ht 63.0 in | Wt 202.2 lb

## 2020-10-19 DIAGNOSIS — R5382 Chronic fatigue, unspecified: Secondary | ICD-10-CM

## 2020-10-19 DIAGNOSIS — R0602 Shortness of breath: Secondary | ICD-10-CM

## 2020-10-19 DIAGNOSIS — R079 Chest pain, unspecified: Secondary | ICD-10-CM | POA: Diagnosis not present

## 2020-10-19 DIAGNOSIS — E669 Obesity, unspecified: Secondary | ICD-10-CM

## 2020-10-19 DIAGNOSIS — R0683 Snoring: Secondary | ICD-10-CM | POA: Diagnosis not present

## 2020-10-19 LAB — T3: T3, Total: 103 ng/dL (ref 71–180)

## 2020-10-19 MED ORDER — METOPROLOL TARTRATE 100 MG PO TABS: ORAL_TABLET | ORAL | 0 refills | Status: DC

## 2020-10-19 NOTE — Patient Instructions (Signed)
Medication Instructions:  Take Metoprolol 100 mg two hours before the CT when scheduled.   *If you need a refill on your cardiac medications before your next appointment, please call your pharmacy*   Lab Work: TSH, Free T4, BNP, BMET today   If you have labs (blood work) drawn today and your tests are completely normal, you will receive your results only by: [place] (if you have MyChart) OR A paper copy in the mail If you have any lab test that is abnormal or we need to change your treatment, we will call you to review the results.   Testing/Procedures: Your physician has requested that you have cardiac CT. Cardiac computed tomography (CT) is a painless test that uses an x-ray machine to take clear, detailed pictures of your heart. For further information please visit [email or web address].[email or web address]. Please follow instruction sheet as given.  Your physician has recommended that you have a sleep study. This test records several body functions during sleep, including: brain activity, eye movement, oxygen and carbon dioxide blood levels, heart rate and rhythm, breathing rate and rhythm, the flow of air through your mouth and nose, snoring, body muscle movements, and chest and belly movement.    Follow-Up: At [facility], you and your health needs are our priority.  As part of our continuing mission to provide you with exceptional heart care, we have created designated Provider Care Teams.  These Care Teams include your primary Cardiologist (physician) and Advanced Practice Providers (APPs -  Physician Assistants and Nurse Practitioners) who all work together to provide you with the care you need, when you need it.  We recommend signing up for the patient portal called "MyChart".  Sign up information is provided on this After Visit Summary.  MyChart is used to connect with patients for Virtual Visits (Telemedicine).  Patients are able to view lab/test results, encounter notes, upcoming  appointments, etc.  Non-urgent messages can be sent to your provider as well.   To learn more about what you can do with MyChart, go to [email or web address].    Your next appointment:   As needed  The format for your next appointment:   In Person  Provider:   Eleonore Chiquito, MD   Other Instructions   Your cardiac CT will be scheduled at one of the below locations:   [facility] [address] [place], [place] [address] [phone]  [place] [address] [place], [place] [address] [phone]  If scheduled at [facility], please arrive at the [facility] main entrance (entrance A) of [facility] 30 minutes prior to test start time. Proceed to the [facility] Radiology Department (first floor) to check-in and test prep.  If scheduled at [facility], please arrive 15 mins early for check-in and test prep.  Please follow these instructions carefully (unless otherwise directed):  Hold all erectile dysfunction medications at least 3 days (72 hrs) prior to test.  On the Night Before the Test: Be sure to Drink plenty of water. Do not consume any caffeinated/decaffeinated beverages or chocolate 12 hours prior to your test. Do not take any antihistamines 12 hours prior to your test.  On the Day of the Test: Drink plenty of water until 1 hour prior to the test. Do not eat any food 4 hours prior to the test. You may take your regular medications prior to the test.  Take metoprolol (Lopressor) two hours prior to test.  HOLD Furosemide/Hydrochlorothiazide morning of the test. FEMALES- please wear underwire-free bra if available, avoid dresses & tight clothing    After the Test: Drink plenty of water. After receiving IV contrast, you may experience a mild flushed feeling. This is normal. On occasion, you may experience a mild rash up to 24 hours after the test. This  is not dangerous. If this occurs, you can take Benadryl 25 mg and increase your fluid intake. If you experience trouble breathing, this can be serious. If it is severe call 911 IMMEDIATELY. If it is mild, please call our office. If you take any of these medications: Glipizide/Metformin, Avandament, Glucavance, please do not take 48 hours after completing test unless otherwise instructed.   Once we have confirmed authorization from your insurance company, we will call you to set up a date and time for your test. Based on how quickly your insurance processes prior authorizations requests, please allow up to 4 weeks to be contacted for scheduling your Cardiac CT appointment. Be advised that routine Cardiac CT appointments could be scheduled as many as 8 weeks after your provider has ordered it.  For non-scheduling related questions, please contact the cardiac imaging nurse navigator should you have any questions/concerns: Marchia Bond, Cardiac Imaging Nurse Navigator Gordy Clement, Cardiac Imaging Nurse Navigator [place] [facility] Direct Office Dial: [phone]   For scheduling needs, including cancellations and rescheduling, please call [place], [phone].

## 2020-10-20 LAB — BASIC METABOLIC PANEL
BUN/Creatinine Ratio: 38 — ABNORMAL HIGH (ref 9–23)
BUN: 22 mg/dL (ref 6–24)
CO2: 22 mmol/L (ref 20–29)
Calcium: 9.2 mg/dL (ref 8.7–10.2)
Chloride: 103 mmol/L (ref 96–106)
Creatinine, Ser: 0.58 mg/dL (ref 0.57–1.00)
Glucose: 110 mg/dL — ABNORMAL HIGH (ref 65–99)
Potassium: 4.2 mmol/L (ref 3.5–5.2)
Sodium: 139 mmol/L (ref 134–144)
eGFR: 109 mL/min/{1.73_m2} (ref >59)

## 2020-10-20 LAB — BRAIN NATRIURETIC PEPTIDE: BNP: 27.4 pg/mL (ref 0.0–100.0)

## 2020-10-20 LAB — T4, FREE: Free T4: 1.03 ng/dL (ref 0.82–1.77)

## 2020-10-20 LAB — TSH: TSH: 1.64 u[IU]/mL (ref 0.450–4.500)

## 2020-10-23 MED ORDER — APIXABAN 5 MG PO TABS
ORAL_TABLET | Freq: Two times a day (BID) | ORAL | 4 refills | Status: DC
  Filled 2020-10-23: qty 60, 30d supply, fill #0
  Filled 2020-12-15: qty 60, 30d supply, fill #1
  Filled 2021-01-25: qty 60, 30d supply, fill #2
  Filled 2021-03-06: qty 60, 30d supply, fill #3
  Filled 2021-05-03: qty 60, 30d supply, fill #4

## 2020-10-24 ENCOUNTER — Other Ambulatory Visit (HOSPITAL_COMMUNITY): Payer: Self-pay

## 2020-10-28 ENCOUNTER — Ambulatory Visit: Payer: 59

## 2020-10-28 ENCOUNTER — Ambulatory Visit: Payer: 59 | Admitting: Hematology

## 2020-10-28 ENCOUNTER — Telehealth: Payer: Self-pay | Admitting: Hematology

## 2020-10-28 ENCOUNTER — Other Ambulatory Visit: Payer: 59

## 2020-10-28 NOTE — Telephone Encounter (Signed)
Rescheduled missed appointment per 7/22 secure chat. Patient is aware of changes.

## 2020-10-31 ENCOUNTER — Telehealth (HOSPITAL_COMMUNITY): Payer: Self-pay | Admitting: *Deleted

## 2020-10-31 NOTE — Telephone Encounter (Signed)
Attempted to call patient regarding upcoming cardiac CT appointment. °Left message on voicemail with name and callback number ° °Jadriel Saxer RN Navigator Cardiac Imaging °[place] [facility] °[phone] Office °[phone] Cell ° °

## 2020-11-02 ENCOUNTER — Ambulatory Visit (HOSPITAL_COMMUNITY)
Admission: RE | Admit: 2020-11-02 | Discharge: 2020-11-02 | Disposition: A | Discharge status: Home / Self Care | Payer: 59 | Source: Ambulatory Visit | Attending: Cardiovascular Disease | Admitting: Cardiovascular Disease

## 2020-11-02 ENCOUNTER — Encounter (HOSPITAL_COMMUNITY): Payer: Self-pay

## 2020-11-02 ENCOUNTER — Other Ambulatory Visit: Payer: Self-pay

## 2020-11-02 DIAGNOSIS — R079 Chest pain, unspecified: Secondary | ICD-10-CM

## 2020-11-02 MED ORDER — IOHEXOL 350 MG/ML SOLN
95.0000 mL | Freq: Once | INTRAVENOUS | Status: AC | PRN
  Administered 2020-11-02: 95 mL via INTRAVENOUS

## 2020-11-02 MED ORDER — NITROGLYCERIN 0.4 MG SL SUBL
SUBLINGUAL_TABLET | SUBLINGUAL | Status: AC
  Administered 2020-11-02: 0.8 mg via SUBLINGUAL
  Filled 2020-11-02: qty 2

## 2020-11-02 MED ORDER — NITROGLYCERIN 0.4 MG SL SUBL: 0.8000 mg | SUBLINGUAL_TABLET | Freq: Once | SUBLINGUAL | Status: AC

## 2020-11-03 ENCOUNTER — Other Ambulatory Visit: Payer: Self-pay

## 2020-11-03 MED ORDER — ATORVASTATIN CALCIUM 20 MG PO TABS
20.0000 mg | ORAL_TABLET | Freq: Every day | ORAL | 4 refills | Status: DC
  Filled 2021-01-19: qty 90, 90d supply, fill #0

## 2020-11-11 ENCOUNTER — Other Ambulatory Visit (HOSPITAL_COMMUNITY): Payer: Self-pay

## 2020-11-11 ENCOUNTER — Other Ambulatory Visit: Payer: Self-pay

## 2020-11-11 ENCOUNTER — Inpatient Hospital Stay: Payer: 59

## 2020-11-11 ENCOUNTER — Inpatient Hospital Stay (HOSPITAL_BASED_OUTPATIENT_CLINIC_OR_DEPARTMENT_OTHER): Payer: 59 | Admitting: Hematology

## 2020-11-11 ENCOUNTER — Other Ambulatory Visit: Payer: Self-pay | Admitting: Hematology

## 2020-11-11 ENCOUNTER — Telehealth: Payer: Self-pay | Admitting: *Deleted

## 2020-11-11 ENCOUNTER — Inpatient Hospital Stay: Payer: 59 | Attending: Internal Medicine

## 2020-11-11 VITALS — BP 134/89 | HR 77 | Temp 98.3°F | Resp 18 | Ht 63.0 in | Wt 200.2 lb

## 2020-11-11 DIAGNOSIS — E538 Deficiency of other specified B group vitamins: Secondary | ICD-10-CM | POA: Insufficient documentation

## 2020-11-11 DIAGNOSIS — N920 Excessive and frequent menstruation with regular cycle: Secondary | ICD-10-CM | POA: Diagnosis not present

## 2020-11-11 DIAGNOSIS — Z86711 Personal history of pulmonary embolism: Secondary | ICD-10-CM | POA: Diagnosis not present

## 2020-11-11 DIAGNOSIS — Z7901 Long term (current) use of anticoagulants: Secondary | ICD-10-CM | POA: Diagnosis not present

## 2020-11-11 DIAGNOSIS — R079 Chest pain, unspecified: Secondary | ICD-10-CM | POA: Insufficient documentation

## 2020-11-11 DIAGNOSIS — D500 Iron deficiency anemia secondary to blood loss (chronic): Secondary | ICD-10-CM

## 2020-11-11 DIAGNOSIS — D509 Iron deficiency anemia, unspecified: Secondary | ICD-10-CM | POA: Diagnosis not present

## 2020-11-11 DIAGNOSIS — Z85820 Personal history of malignant melanoma of skin: Secondary | ICD-10-CM | POA: Diagnosis not present

## 2020-11-11 DIAGNOSIS — R1010 Upper abdominal pain, unspecified: Secondary | ICD-10-CM | POA: Insufficient documentation

## 2020-11-11 DIAGNOSIS — K219 Gastro-esophageal reflux disease without esophagitis: Secondary | ICD-10-CM | POA: Insufficient documentation

## 2020-11-11 LAB — IRON AND TIBC
Iron: 95 ug/dL (ref 41–142)
Saturation Ratios: 36 % (ref 21–57)
TIBC: 265 ug/dL (ref 236–444)
UIBC: 170 ug/dL (ref 120–384)

## 2020-11-11 LAB — RETIC PANEL
Immature Retic Fract: 14.2 % (ref 2.3–15.9)
RBC.: 4.15 MIL/uL (ref 3.87–5.11)
Retic Count, Absolute: 78.9 10*3/uL (ref 19.0–186.0)
Retic Ct Pct: 1.9 % (ref 0.4–3.1)
Reticulocyte Hemoglobin: 34.4 pg (ref >27.9)

## 2020-11-11 LAB — CBC WITH DIFFERENTIAL (CANCER CENTER ONLY)
Abs Immature Granulocytes: 0.02 10*3/uL (ref 0.00–0.07)
Basophils Absolute: 0 10*3/uL (ref 0.0–0.1)
Basophils Relative: 1 %
Eosinophils Absolute: 0.1 10*3/uL (ref 0.0–0.5)
Eosinophils Relative: 2 %
HCT: 37.9 % (ref 36.0–46.0)
Hemoglobin: 12.8 g/dL (ref 12.0–15.0)
Immature Granulocytes: 0 %
Lymphocytes Relative: 28 %
Lymphs Abs: 1.4 10*3/uL (ref 0.7–4.0)
MCH: 30.8 pg (ref 26.0–34.0)
MCHC: 33.8 g/dL (ref 30.0–36.0)
MCV: 91.3 fL (ref 80.0–100.0)
Monocytes Absolute: 0.3 10*3/uL (ref 0.1–1.0)
Monocytes Relative: 7 %
Neutro Abs: 3.1 10*3/uL (ref 1.7–7.7)
Neutrophils Relative %: 62 %
Platelet Count: 249 10*3/uL (ref 150–400)
RBC: 4.15 MIL/uL (ref 3.87–5.11)
RDW: 13.4 % (ref 11.5–15.5)
WBC Count: 5.1 10*3/uL (ref 4.0–10.5)
nRBC: 0 % (ref 0.0–0.2)

## 2020-11-11 LAB — FERRITIN: Ferritin: 100 ng/mL (ref 11–307)

## 2020-11-11 LAB — VITAMIN B12: Vitamin B-12: 391 pg/mL (ref 180–914)

## 2020-11-11 MED ORDER — PANTOPRAZOLE SODIUM 20 MG PO TBEC
20.0000 mg | DELAYED_RELEASE_TABLET | Freq: Every day | ORAL | 2 refills | Status: DC
  Filled 2020-11-11: qty 30, 30d supply, fill #0
  Filled 2021-05-03: qty 30, 30d supply, fill #1

## 2020-11-11 NOTE — Progress Notes (Addendum)
[place]   Telephone:([phone]) [phone] Fax:(336) [phone]   Clinic Follow up Note   Patient Care Team: Ronnald Nian, DO as PCP - General (Family Medicine)  Date of Service:  11/11/2020  CHIEF COMPLAINT: f/u of PE and iron deficient anemia   CURRENT THERAPY:  -IV Feraheme as needed since 06/2018. Due to insurance she was switched to IV Venofer on 07/15/19 -oral iron and oral B12 -monthly B12 injection since 06/25/18. Changed to at home injections after 03/07/20.   INTERVAL HISTORY:  Rhonda Ford is here for a follow up of PE and iron deficient anemia. She was last seen by me on 09/02/20. She presents to the clinic alone. She reports doing well overall. She reports she underwent cardiac work up recently and is scheduled to proceed with sleep study to evaluate for sleep apnea. She notes stress, mainly from just normal life things. She has 5 children, one of which is already off to college. She notes she will be sending off 3 more at the end of this school year.   All other systems were reviewed with the patient and are negative.  MEDICAL HISTORY:  Past Medical History:  Diagnosis Date   B12 deficiency    Iron deficiency anemia due to chronic blood loss    Melanoma ([place])    Migraines    Pulmonary emboli ([place]) 01/2018   Skin cancer     SURGICAL HISTORY: Past Surgical History:  Procedure Laterality Date   gyn surgeries     melanoma surgery      leg    I have reviewed the social history and family history with the patient and they are unchanged from previous note.  ALLERGIES:  has No Known Allergies.  MEDICATIONS:  Current Outpatient Medications  Medication Sig Dispense Refill   apixaban (ELIQUIS) 5 MG TABS tablet TAKE 1 TABLET BY MOUTH TWICE DAILY 60 tablet 4   atorvastatin (LIPITOR) 20 MG tablet Take 1 tablet (20 mg total) by mouth daily. 90 tablet 3   cyanocobalamin (,VITAMIN B-12,) 1000 MCG/ML injection Inject 1 ml (1,000 mcg total) into the  muscle once for 1 dose, inject weekly for 8 weeks then once every month as directed. 4 mL 4   Ferrous Sulfate (SLOW FE PO) Take by mouth.     fluorouracil (EFUDEX) 5 % cream Apply topically 2 (two) times daily.     levothyroxine (SYNTHROID) 25 MCG tablet TAKE 1 TABLET (25 MCG TOTAL) BY MOUTH DAILY BEFORE BREAKFAST. 90 tablet 2   losartan (COZAAR) 25 MG tablet Take 1 tablet (25 mg total) by mouth daily. 90 tablet 1   metoprolol tartrate (LOPRESSOR) 100 MG tablet Take 1 tablet by mouth once for procedure. 1 tablet 0   SYRINGE-NEEDLE, DISP, 3 ML (B-D 3CC LUER-LOK SYR 25GX5/8") 25G X 5/8" 3 ML MISC use as directed with B12 injections (Patient taking differently: use as directed with B12 injections) 4 each 4   No current facility-administered medications for this visit.    PHYSICAL EXAMINATION: ECOG PERFORMANCE STATUS: 1 - Symptomatic but completely ambulatory  There were no vitals filed for this visit. Wt Readings from Last 3 Encounters:  10/19/20 202 lb 3.2 oz (91.7 kg)  09/02/20 200 lb 8 oz (90.9 kg)  08/17/20 196 lb 12.8 oz (89.3 kg)    GENERAL:alert, no distress and comfortable SKIN: skin color normal, no rashes or significant lesions EYES: normal, Conjunctiva are pink and non-injected, sclera clear  NEURO: alert & oriented x 3 with  fluent speech  LABORATORY DATA:  I have reviewed the data as listed CBC Latest Ref Rng & Units 09/28/2020 09/16/2020 09/02/2020  WBC 4.0 - 10.5 K/uL 7.1 5.2 5.8  Hemoglobin 12.0 - 15.0 g/dL 12.0 11.9(L) 10.1(L)  Hematocrit 36.0 - 46.0 % 36.4 36.3 31.1(L)  Platelets 150 - 400 K/uL 256 304 264     CMP Latest Ref Rng & Units 10/19/2020 08/17/2020 11/21/2018  Glucose 65 - 99 mg/dL 110(H) 95 99  BUN 6 - 24 mg/dL '22 15 9  '$ Creatinine 0.57 - 1.00 mg/dL 0.58 0.54 0.67  Sodium 134 - 144 mmol/L 139 140 140  Potassium 3.5 - 5.2 mmol/L 4.2 3.8 3.7  Chloride 96 - 106 mmol/L 103 105 107  CO2 20 - 29 mmol/L '22 26 24  '$ Calcium 8.7 - 10.2 mg/dL 9.2 9.3 8.5(L)  Total  Protein 6.5 - 8.1 g/dL - - 6.1(L)  Total Bilirubin 0.3 - 1.2 mg/dL - - 0.2(L)  Alkaline Phos 38 - 126 U/L - - 69  AST 15 - 41 U/L - - 14(L)  ALT 0 - 44 U/L - - 15      RADIOGRAPHIC STUDIES: I have personally reviewed the radiological images as listed and agreed with the findings in the report. No results found.   ASSESSMENT & PLAN:  [name] is a 50 y.o. female with   1. Iron deficiency anemia, B12 deficiency -Likely secondary to menorrhagia. Unclear the etiology about her B12 deficiency, her intrinsic factor was negative -Per patient she has been anemic ranging from 7.5-10 since she was an adolescent with chronic fatigue. This had not been worked up until her PE in late 2019.  -Workup done by Dr. Walden Field in 06/2018 is consistent with iron deficiency anemia. -Her 01/2018 colonoscopy and EGD which was normal except stomach ulcer, no H. Pylorie. She has recently stopped PPI -She has had long history of menorrhagia which is the likely cause of her anemia. Since 04/2018 her periods have been irregular and occasionally lighter. She notes 11/2020 that she is having periods about every other month now. -She was on IV Feraheme as needed since 06/2018. Due to insurance she was switched to IV Venofer on 07/15/19.   -She also has B12 deficiency based on lab in early 2020. She has had no gastric or GI surgeries. She was on monthly B12 injections since 06/2018 but she takes oral B12 for convenience. I recommend she either receive an injection here or give herself one at home every 2-3 months. She is agreeable to at-home injections. -She required blood transfusion 07/12/20, and most recent IV iron in 09/2020. -Her iron panel and B12 today are pending. I will call her with the results to determine if she needs IV iron. -based on her previous ned for IV iron, I recommend iv venofer '400mg'$  every 2 months (based on last lab results) to avoid severe anemia, she is agreeable    2. H/o Pulmonary Emboli,  Unprovoked  -Occurred in 01/2018. No PE seen on 07/03/18 Angio CT chest.  -She has been treated with Eliquis since 06/2018. Given this was significant, unprovoked PE and no family history of PE, it has been recommended to continue indefinitely.  -Continue Eliquis, okay to hold 2 to 3 days during menstrual period due to menorrhagia.   3. Melanoma and Squamous cell carcinoma. 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 She is followed by dermatology who she saw when she lived in [place].  -  She has recent recurrence of squamous cell (2021). She is currently being treated with topical cream by Dermatologist.    4. Chest pain and upper abdominal pain, reflux, possible sleep apnea -She was referred to cardiology by her primary care physician for her chest pain. Work up has been normal so far. There is concern for stress-related chest pains. -She had a history of gastric ulcer in the past, was on Protonix, stopped due to interaction with oral iron. We will restart for her today. -We will refer her to back to GI for further evaluation. Last colonoscopy 01/2018.      PLAN:  -I refilled Protonix for her today -f/u with GI -lab and IV Venofer '400mg'$  every 2 months (unless last ferritin>300) -continue home B12 injection every 2 months  -f/u in 6 months    No problem-specific Assessment & Plan notes found for this encounter.   No orders of the defined types were placed in this encounter.  All questions were answered. The patient knows to call the clinic with any problems, questions or concerns. No barriers to learning was detected. The total time spent in the appointment was 20 minutes.     Truitt Merle, MD 11/11/2020   I, Wilburn Mylar, am acting as scribe for Truitt Merle, MD.   I have reviewed the above documentation for accuracy and completeness, and I agree with the above.

## 2020-11-11 NOTE — Telephone Encounter (Signed)
Per staff message (4) Venofer for 01/13/21 & 03/17/21

## 2020-11-12 ENCOUNTER — Encounter: Payer: Self-pay | Admitting: Internal Medicine

## 2020-11-12 ENCOUNTER — Encounter: Payer: Self-pay | Admitting: Hematology

## 2020-11-29 ENCOUNTER — Telehealth: Payer: Self-pay | Admitting: *Deleted

## 2020-11-29 NOTE — Telephone Encounter (Signed)
Patient notified of HST appointment details. °

## 2020-12-15 ENCOUNTER — Other Ambulatory Visit (HOSPITAL_COMMUNITY): Payer: Self-pay

## 2020-12-27 ENCOUNTER — Encounter: Payer: Self-pay | Admitting: Internal Medicine

## 2021-01-03 ENCOUNTER — Other Ambulatory Visit: Payer: Self-pay

## 2021-01-03 ENCOUNTER — Ambulatory Visit (HOSPITAL_BASED_OUTPATIENT_CLINIC_OR_DEPARTMENT_OTHER): Payer: 59 | Attending: Cardiovascular Disease | Admitting: Cardiovascular Disease

## 2021-01-03 DIAGNOSIS — R5382 Chronic fatigue, unspecified: Secondary | ICD-10-CM | POA: Insufficient documentation

## 2021-01-03 DIAGNOSIS — R0683 Snoring: Secondary | ICD-10-CM | POA: Diagnosis not present

## 2021-01-03 DIAGNOSIS — G4733 Obstructive sleep apnea (adult) (pediatric): Secondary | ICD-10-CM | POA: Insufficient documentation

## 2021-01-13 ENCOUNTER — Encounter (HOSPITAL_BASED_OUTPATIENT_CLINIC_OR_DEPARTMENT_OTHER): Payer: Self-pay | Admitting: Cardiovascular Disease

## 2021-01-13 ENCOUNTER — Other Ambulatory Visit: Payer: Self-pay

## 2021-01-13 ENCOUNTER — Inpatient Hospital Stay: Payer: 59 | Attending: Internal Medicine

## 2021-01-13 VITALS — BP 139/70 | HR 81 | Temp 98.0°F | Resp 17

## 2021-01-13 DIAGNOSIS — D509 Iron deficiency anemia, unspecified: Secondary | ICD-10-CM | POA: Insufficient documentation

## 2021-01-13 DIAGNOSIS — D500 Iron deficiency anemia secondary to blood loss (chronic): Secondary | ICD-10-CM

## 2021-01-13 MED ORDER — SODIUM CHLORIDE 0.9 % IV SOLN
400.0000 mg | Freq: Once | INTRAVENOUS | Status: AC
  Administered 2021-01-13: 400 mg via INTRAVENOUS
  Filled 2021-01-13: qty 20

## 2021-01-13 MED ORDER — SODIUM CHLORIDE 0.9 % IV SOLN: INTRAVENOUS | Status: DC

## 2021-01-13 NOTE — Procedures (Signed)
     Patient Name: Naz, Denunzio Date: 01/03/2021 Gender: Female D.O.B: 29-Nov-1970 Age (years): 50 Referring Provider: Cassie Freer ONeal Height (inches): 63 Interpreting Physician: Shelva Majestic MD, ABSM Weight (lbs): 202 RPSGT: Jacolyn Reedy BMI: 36 MRN: [id number] Neck Size: <br>  CLINICAL INFORMATION Sleep Study Type: HST  Indication for sleep study: snoring, chronic fatigue  Epworth Sleepiness Score: 7  SLEEP STUDY TECHNIQUE A multi-channel overnight portable sleep study was performed. The channels recorded were: nasal airflow, thoracic respiratory movement, and oxygen saturation with a pulse oximetry. Snoring was also monitored.  MEDICATIONS apixaban (ELIQUIS) 5 MG TABS tablet atorvastatin (LIPITOR) 20 MG tablet cyanocobalamin (,VITAMIN B-12,) 1000 MCG/ML injection Ferrous Sulfate (SLOW FE PO) fluorouracil (EFUDEX) 5 % cream levothyroxine (SYNTHROID) 25 MCG tablet losartan (COZAAR) 25 MG tablet metoprolol tartrate (LOPRESSOR) 100 MG tablet pantoprazole (PROTONIX) 20 MG tablet Patient self administered medications include: N/A.  SLEEP ARCHITECTURE Patient was studied for 377.9 minutes. The sleep efficiency was 99.5 % and the patient was supine for 98.9%. The arousal index was 0.0 per hour.  RESPIRATORY PARAMETERS The overall AHI was 11.7 per hour, with a central apnea index of 0 per hour.   The oxygen nadir was 87% during sleep.  CARDIAC DATA Mean heart rate during sleep was 68.8 bpm.  IMPRESSIONS - Mild obstructive sleep apnea occurred during this study (AHI 11.7/h). - Mild oxygen desaturation to a nadir of 87%. - Patient snored 6.0% during the sleep.  DIAGNOSIS - Obstructive Sleep Apnea (G47.33)  RECOMMENDATIONS - Therapeutic CPAP titration to determine optimal pressure required to alleviate sleep disordered breathing. Initiate Auto-PAP with EPR of 3 and 6 - 16 cm of water. - Effort should be made to optimize nasal and  oropharyngeal patency.  - If patient is against CPAP initiation can consider alternative therapy with a customized oral appliance. - Avoid alcohol, sedatives and other CNS depressants that may worsen sleep apnea and disrupt normal sleep architecture. - Sleep hygiene should be reviewed to assess factors that may improve sleep quality. - Weight management (BMI 36) and regular exercise should be initiated or continued. - Recommend a download and sleep clinic evaluation after one month of therapy.    [Electronically signed] 01/13/2021 09:37 AM  Shelva Majestic MD, [facility], [place], American Board of Sleep Medicine   NPI: [id number]  [place] PH: [phone]   FX: [phone] [place]

## 2021-01-13 NOTE — Patient Instructions (Signed)

## 2021-01-18 ENCOUNTER — Other Ambulatory Visit (HOSPITAL_COMMUNITY): Payer: Self-pay

## 2021-01-18 NOTE — Telephone Encounter (Signed)
Pt is returning call regarding when should she receive her CPAP Machine and supplies. Please advise pt further

## 2021-01-19 ENCOUNTER — Telehealth: Payer: Self-pay | Admitting: Cardiovascular Disease

## 2021-01-19 ENCOUNTER — Other Ambulatory Visit (HOSPITAL_COMMUNITY): Payer: Self-pay

## 2021-01-19 DIAGNOSIS — I10 Essential (primary) hypertension: Secondary | ICD-10-CM

## 2021-01-19 MED ORDER — LOSARTAN POTASSIUM 25 MG PO TABS
25.0000 mg | ORAL_TABLET | Freq: Every day | ORAL | 1 refills | Status: DC
  Filled 2021-01-19: qty 90, 90d supply, fill #0
  Filled 2021-05-03: qty 30, 30d supply, fill #1
  Filled 2021-08-16: qty 30, 30d supply, fill #2
  Filled 2021-10-03: qty 30, 30d supply, fill #3

## 2021-01-19 MED ORDER — ATORVASTATIN CALCIUM 20 MG PO TABS
ORAL_TABLET | ORAL | 3 refills | Status: DC
  Filled 2021-01-19: qty 90, 90d supply, fill #0
  Filled 2021-05-03: qty 30, 30d supply, fill #1
  Filled 2021-08-16: qty 30, 30d supply, fill #2
  Filled 2021-10-03: qty 30, 30d supply, fill #3
  Filled 2021-11-02: qty 30, 30d supply, fill #4

## 2021-01-19 NOTE — Telephone Encounter (Signed)
*  STAT* If patient is at the pharmacy, call can be transferred to refill team.   1. Which medications need to be refilled? (please list name of each medication and dose if known)  losartan (COZAAR) 25 MG tablet   2. Which pharmacy/location (including street and city if local pharmacy) is medication to be sent to?  [facility]  3. Do they need a 30 day or 90 day supply?  90 day supply

## 2021-01-20 ENCOUNTER — Telehealth: Payer: Self-pay | Admitting: *Deleted

## 2021-01-20 NOTE — Telephone Encounter (Signed)
CPAP order placed to [facility] via [facility] web portal.

## 2021-01-23 ENCOUNTER — Other Ambulatory Visit (HOSPITAL_COMMUNITY): Payer: Self-pay

## 2021-01-25 ENCOUNTER — Other Ambulatory Visit (HOSPITAL_COMMUNITY): Payer: Self-pay

## 2021-01-25 NOTE — Telephone Encounter (Signed)
Patient notified that her CPAP order was just placed on 01/20/21. There's a Tree surgeon of machines and it can take anywhere from 4-6 months for her to receive a machine.

## 2021-01-26 ENCOUNTER — Other Ambulatory Visit (HOSPITAL_COMMUNITY): Payer: Self-pay

## 2021-01-31 ENCOUNTER — Other Ambulatory Visit (HOSPITAL_COMMUNITY): Payer: Self-pay

## 2021-02-01 ENCOUNTER — Encounter: Payer: Self-pay | Admitting: Internal Medicine

## 2021-02-01 ENCOUNTER — Ambulatory Visit (INDEPENDENT_AMBULATORY_CARE_PROVIDER_SITE_OTHER): Payer: 59 | Admitting: Gastroenterology

## 2021-02-01 ENCOUNTER — Encounter: Payer: Self-pay | Admitting: Gastroenterology

## 2021-02-01 VITALS — BP 124/74 | HR 84 | Ht 63.0 in | Wt 206.1 lb

## 2021-02-01 DIAGNOSIS — D500 Iron deficiency anemia secondary to blood loss (chronic): Secondary | ICD-10-CM

## 2021-02-01 DIAGNOSIS — R079 Chest pain, unspecified: Secondary | ICD-10-CM | POA: Diagnosis not present

## 2021-02-01 DIAGNOSIS — K219 Gastro-esophageal reflux disease without esophagitis: Secondary | ICD-10-CM

## 2021-02-01 NOTE — Progress Notes (Signed)
History of Present Illness: This is a 50 year old female referred by Ronnald Nian, DO for the evaluation of IDA, GERD, chest pain.  She has a history of iron deficiency anemia felt secondary to menorrhagia.  She is followed by hematology and has received iron infusions.  She also has B12 deficiency.  Colonoscopy and EGD were performed in [place] in October 2019.  The colonoscopy report is available and reveals a normal exam to the cecum with a good bowel prep.  EGD report is not available.  The patient relates a gastric ulcer was found.  She states she has very heavy menstrual flow.  She has long-term GERD that has been well controlled on daily pantoprazole.  Recently she has had intermittent vague right-sided chest pain but does not appear to be related to meals or exertion.  She was evaluated by cardiology. Denies weight loss, abdominal pain, constipation, diarrhea, change in stool caliber, melena, hematochezia, nausea, vomiting, dysphagia.    No Known Allergies Outpatient Medications Prior to Visit  Medication Sig Dispense Refill   apixaban (ELIQUIS) 5 MG TABS tablet TAKE 1 TABLET BY MOUTH TWICE DAILY 60 tablet 4   atorvastatin (LIPITOR) 20 MG tablet Take 1 tablet by mouth daily 90 tablet 3   cyanocobalamin (,VITAMIN B-12,) 1000 MCG/ML injection Inject 1 ml (1,000 mcg total) into the muscle once for 1 dose, inject weekly for 8 weeks then once every month as directed. 4 mL 4   Ferrous Sulfate (SLOW FE PO) Take 1 tablet by mouth daily.     fluorouracil (EFUDEX) 5 % cream Apply topically 2 (two) times daily.     levothyroxine (SYNTHROID) 25 MCG tablet TAKE 1 TABLET (25 MCG TOTAL) BY MOUTH DAILY BEFORE BREAKFAST. 90 tablet 2   losartan (COZAAR) 25 MG tablet Take 1 tablet (25 mg total) by mouth daily. 90 tablet 1   pantoprazole (PROTONIX) 20 MG tablet Take 1 tablet (20 mg total) by mouth daily. 30 tablet 2   SYRINGE-NEEDLE, DISP, 3 ML (B-D 3CC LUER-LOK SYR 25GX5/8") 25G X 5/8" 3 ML MISC use  as directed with B12 injections (Patient taking differently: use as directed with B12 injections) 4 each 4   metoprolol tartrate (LOPRESSOR) 100 MG tablet Take 1 tablet by mouth once for procedure. (Patient not taking: Reported on 02/01/2021) 1 tablet 0   No facility-administered medications prior to visit.   Past Medical History:  Diagnosis Date   B12 deficiency    Iron deficiency anemia due to chronic blood loss    Melanoma ([facility])    Migraines    Pulmonary emboli ([place]) 01/2018   Skin cancer    Sleep apnea    Past Surgical History:  Procedure Laterality Date   gyn surgeries     melanoma surgery      leg   Social History   Socioeconomic History   Marital status: Divorced    Spouse name: Not on file   Number of children: 4   Years of education: Not on file   Highest education level: Not on file  Occupational History   Occupation: nurse   Tobacco Use   Smoking status: Never   Smokeless tobacco: Never  Vaping Use   Vaping Use: Never used  Substance and Sexual Activity   Alcohol use: Never   Drug use: Never   Sexual activity: Not on file  Other Topics Concern   Not on file  Social History Narrative   Works as a Building services engineer  Determinants of Health   Financial Resource Strain: Not on file  Food Insecurity: Not on file  Transportation Needs: Not on file  Physical Activity: Not on file  Stress: Not on file  Social Connections: Not on file   Family History  Problem Relation Age of Onset   Heart disease Mother    Stroke Mother    Skin cancer Father    Cancer Maternal Aunt 68       breast cancer and NHL   Heart attack Maternal Grandmother    Cancer Maternal Grandfather        bone cancer   Colon cancer Paternal Grandmother    Thrombosis Neg Hx    Esophageal cancer Neg Hx    Pancreatic cancer Neg Hx    Stomach cancer Neg Hx    Diabetes Neg Hx        Review of Systems: Pertinent positive and negative review of systems were noted in the above HPI section.  All other review of systems were otherwise negative.   Physical Exam: General: Well developed, well nourished, no acute distress Head: Normocephalic and atraumatic Eyes: Sclerae anicteric, EOMI Ears: Normal auditory acuity Mouth: Not examined, mask on during Covid-19 pandemic Neck: Supple, no masses or thyromegaly Lungs: Clear throughout to auscultation Heart: Regular rate and rhythm; no murmurs, rubs or bruits Abdomen: Soft, non tender and non distended. No masses, hepatosplenomegaly or hernias noted. Normal Bowel sounds Rectal: Not done, sending Hemoccults  Musculoskeletal: Symmetrical with no gross deformities  Skin: No lesions on visible extremities Pulses:  Normal pulses noted Extremities: No clubbing, cyanosis, edema or deformities noted Neurological: Alert oriented x 4, grossly nonfocal Cervical Nodes:  No significant cervical adenopathy Inguinal Nodes: No significant inguinal adenopathy Psychological:  Alert and cooperative. Normal mood and affect   Assessment and Recommendations:  IDA felt secondary to menorrhagia.  Request records from prior EGD report to review.  Send stool Hemoccults.  With shared decision making we developed the following plan.  If Hemoccult negative we will not plan to repeat her GI evaluation at this time.  If Hemoccult positive recommend colonoscopy and EGD and if both are negative consider VCE.  Continue follow-up and treatment with hematology and gynecology. GERD.  History of gastric ulcer.  Follow antireflux measures and continue pantoprazole 20 mg daily. History of PE maintained on Eliquis. Right-sided chest pain.  No features to suggest a gastrointestinal cause.  Further follow-up with her PCP.   cc: Ronnald Nian, DO No address on file

## 2021-02-01 NOTE — Patient Instructions (Signed)
Follow the instructions on the Hemoccult cards and mail them back to [place] when you are finished or you may take them directly to the lab in the basement of the [place] building. We will call you with the results.   You will be due for a recall colonoscopy in 01/2028. We will send you a reminder in the mail when it gets closer to that time.  The [facility] providers would like to encourage you to use [facility] to communicate with providers for non-urgent requests or questions.  Due to long hold times on the telephone, sending your provider a message by [facility] may be a faster and more efficient way to get a response.  Please allow 48 business hours for a response.  Please remember that this is for non-urgent requests.   Due to recent changes in healthcare laws, you may see the results of your imaging and laboratory studies on MyChart before your provider has had a chance to review them.  We understand that in some cases there may be results that are confusing or concerning to you. Not all laboratory results come back in the same time frame and the provider may be waiting for multiple results in order to interpret others.  Please give [place] 48 hours in order for your provider to thoroughly review all the results before contacting the office for clarification of your results.   Thank you for choosing me and [facility].  Pricilla Riffle. Dagoberto Ligas., MD., Marval Regal

## 2021-02-03 ENCOUNTER — Other Ambulatory Visit (HOSPITAL_COMMUNITY): Payer: Self-pay

## 2021-02-07 ENCOUNTER — Other Ambulatory Visit (HOSPITAL_COMMUNITY): Payer: Self-pay

## 2021-02-07 ENCOUNTER — Other Ambulatory Visit: Payer: Self-pay

## 2021-02-07 MED ORDER — FLUOROURACIL 5 % EX CREA
TOPICAL_CREAM | CUTANEOUS | 3 refills | Status: AC
  Filled 2021-02-07: qty 40, 14d supply, fill #0
  Filled 2021-08-16: qty 40, 14d supply, fill #1
  Filled 2021-11-02: qty 40, 14d supply, fill #2

## 2021-02-09 DIAGNOSIS — K259 Gastric ulcer, unspecified as acute or chronic, without hemorrhage or perforation: Secondary | ICD-10-CM | POA: Insufficient documentation

## 2021-02-09 DIAGNOSIS — H9190 Unspecified hearing loss, unspecified ear: Secondary | ICD-10-CM | POA: Insufficient documentation

## 2021-02-09 DIAGNOSIS — IMO0001 Reserved for inherently not codable concepts without codable children: Secondary | ICD-10-CM | POA: Insufficient documentation

## 2021-02-10 ENCOUNTER — Ambulatory Visit (INDEPENDENT_AMBULATORY_CARE_PROVIDER_SITE_OTHER): Payer: 59 | Admitting: Family Medicine

## 2021-02-10 ENCOUNTER — Other Ambulatory Visit: Payer: Self-pay

## 2021-02-10 ENCOUNTER — Encounter: Payer: Self-pay | Admitting: Family Medicine

## 2021-02-10 VITALS — BP 130/74 | HR 85 | Temp 97.5°F | Ht 63.0 in | Wt 206.4 lb

## 2021-02-10 DIAGNOSIS — E538 Deficiency of other specified B group vitamins: Secondary | ICD-10-CM | POA: Insufficient documentation

## 2021-02-10 DIAGNOSIS — G4733 Obstructive sleep apnea (adult) (pediatric): Secondary | ICD-10-CM | POA: Diagnosis not present

## 2021-02-10 DIAGNOSIS — D500 Iron deficiency anemia secondary to blood loss (chronic): Secondary | ICD-10-CM

## 2021-02-10 DIAGNOSIS — Z86711 Personal history of pulmonary embolism: Secondary | ICD-10-CM | POA: Insufficient documentation

## 2021-02-10 DIAGNOSIS — E039 Hypothyroidism, unspecified: Secondary | ICD-10-CM

## 2021-02-10 DIAGNOSIS — E782 Mixed hyperlipidemia: Secondary | ICD-10-CM | POA: Diagnosis not present

## 2021-02-10 DIAGNOSIS — I10 Essential (primary) hypertension: Secondary | ICD-10-CM

## 2021-02-10 DIAGNOSIS — E669 Obesity, unspecified: Secondary | ICD-10-CM

## 2021-02-10 DIAGNOSIS — Z1231 Encounter for screening mammogram for malignant neoplasm of breast: Secondary | ICD-10-CM | POA: Diagnosis not present

## 2021-02-10 DIAGNOSIS — Z8711 Personal history of peptic ulcer disease: Secondary | ICD-10-CM | POA: Insufficient documentation

## 2021-02-10 DIAGNOSIS — E785 Hyperlipidemia, unspecified: Secondary | ICD-10-CM | POA: Insufficient documentation

## 2021-02-10 DIAGNOSIS — E6609 Other obesity due to excess calories: Secondary | ICD-10-CM | POA: Insufficient documentation

## 2021-02-10 DIAGNOSIS — C4492 Squamous cell carcinoma of skin, unspecified: Secondary | ICD-10-CM | POA: Insufficient documentation

## 2021-02-10 DIAGNOSIS — Z87410 Personal history of cervical dysplasia: Secondary | ICD-10-CM | POA: Insufficient documentation

## 2021-02-10 NOTE — Progress Notes (Signed)
[facility] [address] [place] [address] Dept: [phone] Dept Fax: [phone]  Transfer of Care Office Visit  Subjective:    Patient ID: Rhonda Ford, female    DOB: January 12, 1971, 50 y.o..   MRN: [id number]  Chief Complaint  Patient presents with   Establish Care    [facility]- establish care.  No concerns.      History of Present Illness:  Patient is in today to establish care. Rhonda Ford was born in [place], [place]. She moved to [place] in 2019. She is divorced, but has been in her current relationship for 4 years. She has 5 children (18-63 years old). 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 She denies use of alcohol, drugs, or tobacco.  Rhonda Ford has a history of hypertension and is managed on losartan.  Rhonda Ford notes she was diagnosed with sleep apnea. She has been unable to obtain a CPAP unit yet, but feels this likely will improve issues she has with fatigue and lack of energy.   Rhonda Ford has a history of hypothyroidism and is managed on levothyroxine.  Rhonda Ford has a history of recurrent, severe iron-deficiency anemia. She notes this has been related to having heavy periods. She has had evaluation of her bleeding by Dr. Ulanda Edison (GYN). She apparently is only having sporadic menses over the past year, so may be approaching menopause. She is followed as well by Dr. Burr Medico (hematology). She is being treated with both oral iron supplements, but iron infusion about every 3 months. Additionally, she has had past Vitamin B12 deficiency, so takes B 12 injections.  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 She is followed by Dr. Leonie Douglas (dermatology). She has Efudex cream available to treat these issues when they arise.  Rhonda Ford has a history of a prior  spontaneous pulmonary embolus. She notes that there may be a link to her severe anemia as well. She has been recommended to stay on Eliquis indefinitely.  Rhonda Ford has a history of hyperlipidemia and was started on atorvastatin earlier this year.  Rhonda Ford also has struggled with obesity. At some point, she would like to consider medical weight management.  Past Medical History: Patient Active Problem List   Diagnosis Date Noted   History of cervical dysplasia 02/10/2021   Squamous cell carcinoma of multiple sites 02/10/2021   History of pulmonary embolus (PE) 02/10/2021   Hyperlipidemia 02/10/2021   Obstructive sleep apnea 02/10/2021   Obesity (BMI 35.0-39.9 without comorbidity) 02/10/2021   Vitamin B12 deficiency 02/10/2021   Essential hypertension 02/10/2021   History of stomach ulcers 02/10/2021   Hearing impaired 02/09/2021   Gastric ulcer 02/09/2021   Hypothyroidism 11/24/2018   Iron deficiency anemia due to chronic blood loss 06/19/2018   Melanoma ([place]) 04/09/2016   Past Surgical History:  Procedure Laterality Date   CESAREAN SECTION     x 4   melanoma surgery      leg   TUBAL LIGATION     Family History  Problem Relation Age of Onset   Hypertension Mother    Heart disease Mother    Stroke Mother    Cancer Father        Skin   Stroke Maternal Aunt    Cancer Maternal Aunt 21       breast cancer and NHL  Cancer Paternal Uncle        Melanoma   Stroke Maternal Grandmother    Heart disease Maternal Grandmother    Heart attack Maternal Grandmother    Cancer Maternal Grandfather        bone cancer   Cancer Paternal Grandmother        colon, melanoma   Colon cancer Paternal Grandmother    Thrombosis Neg Hx    Esophageal cancer Neg Hx    Pancreatic cancer Neg Hx    Stomach cancer Neg Hx    Diabetes Neg Hx    Outpatient Medications Prior to Visit  Medication Sig Dispense Refill   apixaban (ELIQUIS) 5 MG TABS tablet TAKE 1 TABLET BY MOUTH TWICE  DAILY 60 tablet 4   atorvastatin (LIPITOR) 20 MG tablet Take 1 tablet by mouth daily 90 tablet 3   cyanocobalamin (,VITAMIN B-12,) 1000 MCG/ML injection Inject 1 ml (1,000 mcg total) into the muscle once for 1 dose, inject weekly for 8 weeks then once every month as directed. 4 mL 4   Ferrous Sulfate (SLOW FE PO) Take 1 tablet by mouth daily.     fluorouracil (EFUDEX) 5 % cream Apply topically to the affected areas 2 times a day for 2 weeks 40 g 3   levothyroxine (SYNTHROID) 25 MCG tablet TAKE 1 TABLET (25 MCG TOTAL) BY MOUTH DAILY BEFORE BREAKFAST. 90 tablet 2   losartan (COZAAR) 25 MG tablet Take 1 tablet (25 mg total) by mouth daily. 90 tablet 1   pantoprazole (PROTONIX) 20 MG tablet Take 1 tablet (20 mg total) by mouth daily. 30 tablet 2   SYRINGE-NEEDLE, DISP, 3 ML (B-D 3CC LUER-LOK SYR 25GX5/8") 25G X 5/8" 3 ML MISC use as directed with B12 injections (Patient taking differently: use as directed with B12 injections) 4 each 4   fluorouracil (EFUDEX) 5 % cream Apply topically 2 (two) times daily.     No facility-administered medications prior to visit.   No Known Allergies    Objective:   Today's Vitals   02/10/21 1038  BP: 130/74  Pulse: 85  Temp: (!) 97.5 F (36.4 C)  TempSrc: Temporal  SpO2: 98%  Weight: 206 lb 6.4 oz (93.6 kg)  Height: 5\' 3"  (1.6 m)   Body mass index is 36.56 kg/m.   General: Well developed, well nourished. No acute distress. Psych: Alert and oriented. Normal mood and affect.  Health Maintenance Due  Topic Date Due   Pneumococcal Vaccine 29-41 Years old (1 - PCV) Never done   HIV Screening  Never done   Hepatitis C Screening  Never done   Zoster Vaccines- Shingrix (1 of 2) Never done   PAP SMEAR-Modifier  Never done   MAMMOGRAM  Never done   COVID-19 Vaccine (3 - Mixed Product risk series) 06/05/2019    Lab Results: CBC Latest Ref Rng & Units 11/11/2020 09/28/2020 09/16/2020  WBC 4.0 - 10.5 K/uL 5.1 7.1 5.2  Hemoglobin 12.0 - 15.0 g/dL 12.8 12.0  11.9(L)  Hematocrit 36.0 - 46.0 % 37.9 36.4 36.3  Platelets 150 - 400 K/uL 249 256 304   Lab Results  Component Value Date   IRON 95 11/11/2020   TIBC 265 11/11/2020   FERRITIN 100 11/11/2020    Lab Results  Component Value Date   CHOL 201 (H) 08/17/2020   HDL 62.30 08/17/2020   LDLCALC 125 (H) 08/17/2020   TRIG 69.0 08/17/2020   CHOLHDL 3 08/17/2020   CMP Latest Ref Rng & Units 10/19/2020  08/17/2020 11/21/2018  Glucose 65 - 99 mg/dL 110(H) 95 99  BUN 6 - 24 mg/dL 22 15 9   Creatinine 0.57 - 1.00 mg/dL 0.58 0.54 0.67  Sodium 134 - 144 mmol/L 139 140 140  Potassium 3.5 - 5.2 mmol/L 4.2 3.8 3.7  Chloride 96 - 106 mmol/L 103 105 107  CO2 20 - 29 mmol/L 22 26 24   Calcium 8.7 - 10.2 mg/dL 9.2 9.3 8.5(L)  Total Protein 6.5 - 8.1 g/dL - - 6.1(L)  Total Bilirubin 0.3 - 1.2 mg/dL - - 0.2(L)  Alkaline Phos 38 - 126 U/L - - 69  AST 15 - 41 U/L - - 14(L)  ALT 0 - 44 U/L - - 15   Lab Results  Component Value Date   TSH 1.640 10/19/2020    Assessment & Plan:   1. Iron deficiency anemia due to chronic blood loss Rhonda Ford latest iron profile was normal and she was not anemic. It appears to quarterly iron infusions are helping to prevent a recurrent issue. She will continue to follow with Dr. Burr Medico.  2. Essential hypertension Blood pressure is at goal today. Continue losartan.  3. Obstructive sleep apnea I agree with Rhonda Ford that her fatigue symptoms will likely improve once she can get a CPAP machine and start to use this regularly.  4. Hypothyroidism, unspecified type Last TSH was at goal. Not due for repeat until her next visit in 3 months.  5. Mixed hyperlipidemia Due for reassessment of lipids now that she is on atorvastatin. I will have her return for fasting labs.  - Lipid panel; Future  6. Obesity (BMI 35.0-39.9 without comorbidity) Weight is up 18 lbs in the past 19 months. I will check an A1c to assess for prediabetes/diabetes. A GLP-1 agonist could may be  an option to assist with weight loss.  - Hemoglobin A1c; Future  Haydee Salter, MD

## 2021-02-10 NOTE — Addendum Note (Signed)
Addended by: Haydee Salter on: 02/10/2021 03:39 PM   Modules accepted: Orders

## 2021-02-14 ENCOUNTER — Other Ambulatory Visit: Payer: Self-pay

## 2021-02-14 ENCOUNTER — Other Ambulatory Visit (INDEPENDENT_AMBULATORY_CARE_PROVIDER_SITE_OTHER): Payer: 59

## 2021-02-14 DIAGNOSIS — E782 Mixed hyperlipidemia: Secondary | ICD-10-CM | POA: Diagnosis not present

## 2021-02-14 DIAGNOSIS — E669 Obesity, unspecified: Secondary | ICD-10-CM | POA: Diagnosis not present

## 2021-02-14 LAB — LIPID PANEL
Cholesterol: 174 mg/dL (ref 0–200)
HDL: 64.2 mg/dL (ref >39.00)
LDL Cholesterol: 93 mg/dL (ref 0–99)
NonHDL: 109.79
Total CHOL/HDL Ratio: 3
Triglycerides: 82 mg/dL (ref 0.0–149.0)
VLDL: 16.4 mg/dL (ref 0.0–40.0)

## 2021-02-14 LAB — HEMOGLOBIN A1C: Hgb A1c MFr Bld: 5.2 % (ref 4.6–6.5)

## 2021-03-06 ENCOUNTER — Other Ambulatory Visit (HOSPITAL_COMMUNITY): Payer: Self-pay

## 2021-03-17 ENCOUNTER — Ambulatory Visit: Payer: 59

## 2021-04-24 ENCOUNTER — Other Ambulatory Visit (HOSPITAL_COMMUNITY): Payer: Self-pay

## 2021-05-03 ENCOUNTER — Other Ambulatory Visit (HOSPITAL_COMMUNITY): Payer: Self-pay

## 2021-05-03 ENCOUNTER — Encounter: Payer: Self-pay | Admitting: Internal Medicine

## 2021-05-08 ENCOUNTER — Other Ambulatory Visit (HOSPITAL_COMMUNITY): Payer: Self-pay

## 2021-05-10 ENCOUNTER — Telehealth: Payer: Self-pay

## 2021-05-10 ENCOUNTER — Ambulatory Visit: Payer: 59 | Admitting: Family Medicine

## 2021-05-10 ENCOUNTER — Other Ambulatory Visit: Payer: Self-pay

## 2021-05-10 VITALS — BP 118/76 | HR 77 | Temp 97.6°F | Ht 63.0 in | Wt 205.8 lb

## 2021-05-10 DIAGNOSIS — I10 Essential (primary) hypertension: Secondary | ICD-10-CM | POA: Diagnosis not present

## 2021-05-10 DIAGNOSIS — Z86711 Personal history of pulmonary embolism: Secondary | ICD-10-CM

## 2021-05-10 DIAGNOSIS — Z1159 Encounter for screening for other viral diseases: Secondary | ICD-10-CM

## 2021-05-10 DIAGNOSIS — E039 Hypothyroidism, unspecified: Secondary | ICD-10-CM | POA: Diagnosis not present

## 2021-05-10 DIAGNOSIS — E538 Deficiency of other specified B group vitamins: Secondary | ICD-10-CM | POA: Diagnosis not present

## 2021-05-10 DIAGNOSIS — D500 Iron deficiency anemia secondary to blood loss (chronic): Secondary | ICD-10-CM

## 2021-05-10 DIAGNOSIS — Z114 Encounter for screening for human immunodeficiency virus [HIV]: Secondary | ICD-10-CM

## 2021-05-10 DIAGNOSIS — E782 Mixed hyperlipidemia: Secondary | ICD-10-CM

## 2021-05-10 LAB — TSH: TSH: 1.99 u[IU]/mL (ref 0.35–5.50)

## 2021-05-10 NOTE — Telephone Encounter (Signed)
Do you have a number to where to call and I will call her to advise.

## 2021-05-10 NOTE — Progress Notes (Signed)
[facility] [address] [place] [address] Dept: [phone] Dept Fax: [phone]  Chronic Care Office Visit  Subjective:    Patient ID: Rhonda Ford, female    DOB: 03/25/1971, 51 y.o..   MRN: [id number]  Chief Complaint  Patient presents with   Follow-up    3 month f/u .  No concerns.       History of Present Illness:  Patient is in today for reassessment of chronic medical issues.  Ms. Pitz has a history of hypertension and is managed on losartan.   Ms. Shively has sleep apnea. She has been unable to obtain a CPAP unit yet, but feels this likely will improve issues she has with fatigue and lack of energy.    Ms. Bier has a history of hypothyroidism and is managed on levothyroxine.   Ms. Albano has a history of recurrent, severe iron-deficiency anemia related to having heavy periods. She is followed as well by Dr. Burr Medico (hematology). She is being treated with both oral iron supplements, but iron infusion about every 3 months. Additionally, she has had past Vitamin B12 deficiency, so takes B 12 injections. She will see Dr. Burr Medico on 2/10 for her next infusion.   Ms. Soller has a history of a prior spontaneous pulmonary embolus. She notes that there may be a link to her severe anemia as well. She has been recommended to stay on Eliquis indefinitely.   Ms. Mckesson has a history of hyperlipidemia and was started on atorvastatin earlier this year.  Past Medical History: Patient Active Problem List   Diagnosis Date Noted   History of cervical dysplasia 02/10/2021   Squamous cell carcinoma of multiple sites 02/10/2021   History of pulmonary embolus (PE) 02/10/2021   Hyperlipidemia 02/10/2021   Obstructive sleep apnea 02/10/2021   Obesity (BMI 35.0-39.9 without comorbidity) 02/10/2021   Vitamin B12 deficiency 02/10/2021   Essential hypertension 02/10/2021   History of stomach ulcers  02/10/2021   Hearing impaired 02/09/2021   Hypothyroidism 11/24/2018   Iron deficiency anemia due to chronic blood loss 06/19/2018   Melanoma ([place]) 04/09/2016   Past Surgical History:  Procedure Laterality Date   CESAREAN SECTION     x 4   melanoma surgery      leg   TUBAL LIGATION     Family History  Problem Relation Age of Onset   Hypertension Mother    Heart disease Mother    Stroke Mother    Cancer Father        Skin   Stroke Maternal Aunt    Cancer Maternal Aunt 46       breast cancer and NHL   Cancer Paternal Uncle        Melanoma   Stroke Maternal Grandmother    Heart disease Maternal Grandmother    Heart attack Maternal Grandmother    Cancer Maternal Grandfather        bone cancer   Cancer Paternal Grandmother        colon, melanoma   Colon cancer Paternal Grandmother    Thrombosis Neg Hx    Esophageal cancer Neg Hx    Pancreatic cancer Neg Hx    Stomach cancer Neg Hx    Diabetes Neg Hx    Outpatient Medications Prior to Visit  Medication Sig Dispense Refill   apixaban (ELIQUIS) 5 MG TABS tablet TAKE 1 TABLET BY MOUTH TWICE DAILY 60 tablet 4   cyanocobalamin (,VITAMIN B-12,) 1000 MCG/ML injection Inject  1 ml (1,000 mcg total) into the muscle once for 1 dose, inject weekly for 8 weeks then once every month as directed. 4 mL 4   Ferrous Sulfate (SLOW FE PO) Take 1 tablet by mouth daily.     fluorouracil (EFUDEX) 5 % cream Apply topically to the affected areas 2 times a day for 2 weeks 40 g 3   levothyroxine (SYNTHROID) 25 MCG tablet TAKE 1 TABLET (25 MCG TOTAL) BY MOUTH DAILY BEFORE BREAKFAST. 90 tablet 2   losartan (COZAAR) 25 MG tablet Take 1 tablet (25 mg total) by mouth daily. 90 tablet 1   pantoprazole (PROTONIX) 20 MG tablet Take 1 tablet (20 mg total) by mouth daily. 30 tablet 2   SYRINGE-NEEDLE, DISP, 3 ML (B-D 3CC LUER-LOK SYR 25GX5/8") 25G X 5/8" 3 ML MISC use as directed with B12 injections (Patient taking differently: use as directed with B12  injections) 4 each 4   atorvastatin (LIPITOR) 20 MG tablet Take 1 tablet by mouth daily 90 tablet 3   No facility-administered medications prior to visit.   No Known Allergies    Objective:   Today's Vitals   05/10/21 0914  BP: 118/76  Pulse: 77  Temp: 97.6 F (36.4 C)  TempSrc: Temporal  SpO2: 97%  Weight: 205 lb 12.8 oz (93.4 kg)  Height: 5\' 3"  (1.6 m)   Body mass index is 36.46 kg/m.   General: Well developed, well nourished. No acute distress. Psych: Alert and oriented x3. Normal mood and affect.  Health Maintenance Due  Topic Date Due   Hepatitis C Screening  Never done   Zoster Vaccines- Shingrix (1 of 2) Never done   PAP SMEAR-Modifier  Never done   MAMMOGRAM  Never done   COVID-19 Vaccine (3 - Mixed Product risk series) 06/05/2019   Lab Results Last CBC Lab Results  Component Value Date   WBC 5.1 11/11/2020   HGB 12.8 11/11/2020   HCT 37.9 11/11/2020   MCV 91.3 11/11/2020   MCH 30.8 11/11/2020   RDW 13.4 11/11/2020   PLT 249 11/11/2020   Last lipids Lab Results  Component Value Date   CHOL 174 02/14/2021   HDL 64.20 02/14/2021   LDLCALC 93 02/14/2021   TRIG 82.0 02/14/2021   CHOLHDL 3 02/14/2021   Last thyroid functions Lab Results  Component Value Date   TSH 1.640 10/19/2020   T3TOTAL 103 10/19/2020   Last vitamin B12 and Folate Lab Results  Component Value Date   VITAMINB12 391 11/11/2020   FOLATE 11.5 06/19/2018     Assessment & Plan:   1. Essential hypertension Blood pressure is at goal. Continue losartan.  2. Hypothyroidism, unspecified type Due for repeat TSH. Continue levothyroxine. - TSH  3. Iron deficiency anemia due to chronic blood loss Last CBC was normal, so she appears to have responded well to iron therapy and B12 injections. She will continue to follow with Dr. Burr Medico.  4. Vitamin B12 deficiency Improved. Continue B12 injections.  5. Mixed hyperlipidemia Last lipids at goal. Continue atorvastatin.  6. History  of pulmonary embolus (PE) Continue Eliquis.  7. Encounter for hepatitis C screening test for low risk patient  - HCV Ab w Reflex to Quant PCR  8. Screening for HIV (human immunodeficiency virus)  - HIV Antibody (routine testing w rflx)  Haydee Salter, MD

## 2021-05-10 NOTE — Telephone Encounter (Signed)
Patient notifed and given the name of the location due to driving.  She will look up the number.  Dm/cma

## 2021-05-10 NOTE — Telephone Encounter (Signed)
Patient has asked about the mammogram that was ordered back in 02/2021. I didn't see where it had been scheduled. Can you please and thank you check on this?  Thanks,  Dm/cma

## 2021-05-11 LAB — HCV AB W REFLEX TO QUANT PCR: HCV Ab: <0.1 s/co ratio (ref 0.0–0.9)

## 2021-05-11 LAB — HCV INTERPRETATION

## 2021-05-11 LAB — HIV ANTIBODY (ROUTINE TESTING W REFLEX): HIV 1&2 Ab, 4th Generation: NONREACTIVE

## 2021-05-17 NOTE — Progress Notes (Addendum)
[facility] OFFICE PROGRESS NOTE  Rhonda Salter, MD [place] [address]  DIAGNOSIS: f/u of PE and iron deficient anemia   CURRENT THERAPY: -IV Feraheme as needed since 06/2018. Due to insurance she was switched to IV Venofer 400 on 07/15/19 -oral iron and oral B12 -monthly B12 injection since 06/25/18. Changed to at home injections after 03/07/20.   INTERVAL HISTORY: Rhonda Ford 51 y.o. female returns to the clinic today for a follow-up visit.  She was last seen in clinic 6 months ago on 11/11/2020. Her hemoglobin had significantly improved at that time and she was not anemic. Dr. Burr Medico recommended IV iron every 2 months with venofer 400 mg to keep her iron up based on her historic need for iron. Her last IV iron was on 01/13/2021. She missed her December appointment due to having trouble getting off work. She is considering applying again for short term disability until her anemia can again be under control since she can tell she is becoming anemic again.  The patient follows closely with her OBGYN. She has menorrhagia. She mentioned that he OBGYN mentioned she likely needs an ablation, but they are hoping she will go into menopause soon.    Last month, the patient had another menstrual cycle for the first time in 8 months.  Her menstrual cycle is ongoing, though she mentions it is starting to lighten up.  She states it was very heavy for the first 2 weeks. The patient has had iron deficiency anemia since being a teenager.  The patient is being seen for iron deficiency anemia likely secondary to menorrhagia and B12 deficiency.  Regarding the B12 deficiency she gives herself B12 injections.  Dr. Ernestina Penna note indicates that she recommended giving herself B12 injections every 2 to 3 months.  There was some confusion and the patient has been giving herself B12 injections weekly.  She is also compliant with her oral iron supplements as well.  She takes a  65 mg Slow Fe.  Overall, since the patient's menstrual cycle, she reports some worsening fatigue, lightheadedness, dyspnea on exertion, headaches, occasional palpitations, and occasional chest discomfort.  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 Of note, the patient was recently evaluated by cardiology.  No cardiac abnormalities were noted on evaluation.  Besides the heavy menstrual bleeding, the patient denies any abnormal bleeding or bruising.  The patient is on Eliquis due to her history of pulmonary embolism but she has not taking her Eliquis for the past month due to her menstrual cycle.  She saw Dr. Fuller Plan a few months ago who did not recommend any EGD or colonoscopy to evaluate her anemia as she is up-to-date and she did not have any abnormal GI symptoms.  She is here today for evaluation and repeat blood work.    MEDICAL HISTORY: Past Medical History:  Diagnosis Date   B12 deficiency    Iron deficiency anemia due to chronic blood loss    Melanoma ([facility])    Migraines    Pulmonary emboli ([facility]) 01/2018   Skin cancer    Sleep apnea     ALLERGIES:  has No Known Allergies.  MEDICATIONS:  Current Outpatient Medications  Medication Sig Dispense Refill   apixaban (ELIQUIS) 5 MG TABS tablet TAKE 1 TABLET BY MOUTH TWICE DAILY 60 tablet 4   atorvastatin (LIPITOR) 20 MG tablet Take 1 tablet by mouth daily 90 tablet 3  cyanocobalamin (,VITAMIN B-12,) 1000 MCG/ML injection Inject 1 ml (1,000 mcg total) into the muscle once for 1 dose, inject weekly for 8 weeks then once every month as directed. 4 mL 4   Ferrous Sulfate (SLOW FE PO) Take 1 tablet by mouth daily.     fluorouracil (EFUDEX) 5 % cream Apply topically to the affected areas 2 times a day for 2 weeks 40 g 3   levothyroxine (SYNTHROID) 25 MCG tablet TAKE 1 TABLET (25 MCG TOTAL) BY MOUTH DAILY BEFORE BREAKFAST. 90 tablet 2   losartan (COZAAR) 25 MG tablet Take 1 tablet (25 mg total) by mouth daily. 90 tablet 1    pantoprazole (PROTONIX) 20 MG tablet Take 1 tablet (20 mg total) by mouth daily. 30 tablet 2   SYRINGE-NEEDLE, DISP, 3 ML (B-D 3CC LUER-LOK SYR 25GX5/8") 25G X 5/8" 3 ML MISC use as directed with B12 injections (Patient taking differently: use as directed with B12 injections) 4 each 4   No current facility-administered medications for this visit.    SURGICAL HISTORY:  Past Surgical History:  Procedure Laterality Date   CESAREAN SECTION     x 4   melanoma surgery      leg   TUBAL LIGATION      REVIEW OF SYSTEMS:   Review of Systems  Constitutional: Positive for fatigue.  Negative for appetite change, chills,  fever and unexpected weight change.  HENT: Negative for mouth sores, nosebleeds, sore throat and trouble swallowing.   Eyes: Negative for eye problems and icterus.  Respiratory: Positive for shortness of breath with exertion.  Negative for cough, hemoptysis, and wheezing.   Cardiovascular: Positive for intermittent palpitations and chest discomfort.  Negative for leg swelling.  Gastrointestinal: Negative for abdominal pain, constipation, diarrhea, nausea and vomiting.  Genitourinary: Negative for bladder incontinence, difficulty urinating, dysuria, frequency and hematuria.   Musculoskeletal: Negative for back pain, gait problem, neck pain and neck stiffness.  Skin: Negative for itching and rash.  Neurological: Positive for occasional dizziness and lightheadedness.  Positive for headaches. negative for dizziness, extremity weakness, gait problem, and seizures.  Hematological: Negative for adenopathy. Does not bruise/bleed easily.  Positive for menorrhagia. Psychiatric/Behavioral: Negative for confusion, depression and sleep disturbance. The patient is not nervous/anxious.     PHYSICAL EXAMINATION:  Blood pressure (!) 152/83, pulse 87, temperature 97.7 F (36.5 C), temperature source Tympanic, resp. rate 20, height 5\' 3"  (1.6 m), weight 206 lb 9.6 oz (93.7 kg), SpO2 100 %.  ECOG  PERFORMANCE STATUS: 1  Physical Exam  Constitutional: Oriented to person, place, and time and well-developed, well-nourished, and in no distress.  HENT:  Head: Normocephalic and atraumatic.  Mouth/Throat: Oropharynx is clear and moist. No oropharyngeal exudate.  Eyes: Conjunctivae are normal. Right eye exhibits no discharge. Left eye exhibits no discharge. No scleral icterus.  Neck: Normal range of motion. Neck supple.  Cardiovascular: Normal rate, regular rhythm, normal heart sounds and intact distal pulses.   Pulmonary/Chest: Effort normal and breath sounds normal. No respiratory distress. No wheezes. No rales.  Abdominal: Soft. Bowel sounds are normal. Exhibits no distension and no mass. There is no tenderness.  Musculoskeletal: Normal range of motion. Exhibits no edema.  Lymphadenopathy:    No cervical adenopathy.  Neurological: Alert and oriented to person, place, and time. Exhibits normal muscle tone. Gait normal. Coordination normal.  Skin: Skin is warm and dry. No rash noted. Not diaphoretic. No erythema.  Positive for pallor. Psychiatric: Mood, memory and judgment normal.  Vitals reviewed.  LABORATORY DATA: Lab Results  Component Value Date   WBC 5.2 05/19/2021   HGB 7.7 (L) 05/19/2021   HCT 24.5 (L) 05/19/2021   MCV 98.0 05/19/2021   PLT 301 05/19/2021      Chemistry      Component Value Date/Time   NA 139 10/19/2020 0920   K 4.2 10/19/2020 0920   CL 103 10/19/2020 0920   CO2 22 10/19/2020 0920   BUN 22 10/19/2020 0920   CREATININE 0.58 10/19/2020 0920   CREATININE 0.67 11/21/2018 0935      Component Value Date/Time   CALCIUM 9.2 10/19/2020 0920   ALKPHOS 69 11/21/2018 0935   AST 14 (L) 11/21/2018 0935   ALT 15 11/21/2018 0935   BILITOT 0.2 (L) 11/21/2018 0935       RADIOGRAPHIC STUDIES:  No results found.   ASSESSMENT/PLAN:  Rhonda Ford is a 51 y.o. female with    1. Iron deficiency anemia, B12 deficiency -Likely secondary to  menorrhagia. Unclear the etiology about her B12 deficiency, her intrinsic factor was negative -Per patient she has been anemic ranging from 7.5-10 since she was an adolescent with chronic fatigue. This had not been worked up until her PE in late 2019.  -Workup done by Dr. Walden Field in 06/2018 is consistent with iron deficiency anemia.  -Her 01/2018 colonoscopy and EGD which was normal except stomach ulcer, no H. Pylori. She has recently stopped PPI -She has had long history of menorrhagia which is the likely cause of her anemia. Since 04/2018 her periods have been irregular and occasionally lighter.  Today, she reports that she gets periods every 8 or 9 months.  She had a menstrual cycle last month and it has persisted for the past 4 weeks or so.  Follows with OB/GYN who is hoping that she will undergo menopause soon.  The patient is not a good candidate for any type of hormones due to her history of PE and family history of malignancy. -She was on IV Feraheme as needed since 06/2018. Due to insurance she was switched to IV Venofer on 07/15/19.   -She also has B12 deficiency based on lab in early 2020. She has had no gastric or GI surgeries. She was on monthly B12 injections since 06/2018 but she takes oral B12 for convenience. Dr. Burr Medico previously recommend she either receive an injection here or give herself one at home every 2-3 months. She is agreeable to at-home injections.  Due to some confusion, the patient has been giving herself B12 injections weekly (05/19/21). -She required blood transfusion 07/12/20, and most recent IV iron in 01/13/2021. -based on her previous need for IV iron, Dr. Burr Medico recommend iv venofer 400mg  every 2 months (based on lab results) to avoid severe anemia.  She missed one of her 76-month follow-up visits in December 2022.  -Due to the patient's ongoing menstrual cycle for the last 4 weeks, the patient has significant worsening of her anemia today with a hemoglobin of 7.7.  The patient's iron  studies, ferritin and B12 are still pending.  -She will proceed with 400 mg of Venofer today as scheduled.  I discussed the patient's results with Dr. Burr Medico.  Discussed she likely needs at least 1200 mg of IV iron.  I will schedule her for IV iron with Venofer 400 mg today, next week on 05/26/2021, 06/02/2021, and tentatively on 06/09/21 when she comes in for her 1 month follow-up.  We can always cancel this iron infusion if needed based on her  repeat lab work that day.  -We will check her lab work every 2 weeks until her next 1 month follow-up visit to ensure improvement/stability of her hemoglobin. -The patient previously has responded well to IV iron.  Although the patient is symptomatic, she is still functional and has worked the last 6 days or so.  We will hold off on arranging for a blood transfusion at this time.  I will add a sample blood bank in 2 weeks when she gets her repeat labs drawn in the event that she requires a blood transfusion at that time.    2. H/o Pulmonary Emboli, Unprovoked  -Occurred in 01/2018. No PE seen on 07/03/18 Angio CT chest.  -She has been treated with Eliquis since 06/2018. Given this was significant, unprovoked PE and no family history of PE, it has been recommended to continue indefinitely.  -Continue Eliquis, okay to hold 2 to 3 days during menstrual period due to menorrhagia. -The patient reports that she has been holding her Eliquis for 1 month due to her most recent cycle of menorrhagia.  The patient's bleeding is improving at this time and she will likely resume Eliquis soon.   3. Melanoma and Squamous cell carcinoma. 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 She is followed by dermatology who she saw when she lived in [place].  -She has recent recurrence of squamous cell (2021). She is currently being treated with topical cream by Dermatologist.    4. Chest pain and upper abdominal pain, reflux, possible sleep apnea -She was  referred to cardiology by her primary care physician for her chest pain. Work up has been normal so far.  -She had a history of gastric ulcer in the past, was on Protonix, stopped due to interaction with oral iron.  She has resumed her iron supplement and takes slow release 65 mg iron daily -She followed up with Dr. Fuller Plan in October 2022.  No further GI work-up was indicated at that time due to lack of symptoms and being up-to-date on her colonoscopy and EGD.      PLAN:  -lab and IV Venofer 400mg  today, 2/17, and 2/24. -Labs in 2 weeks with iron infusion -Labs, return visit, and infusion in 4 weeks. Will tentatively have infusion scheduled that day in the event she needs infusion or transfusion support. Can always cancel if not needed. -continue home B12 injection every 2 months. Clarified this frequency with the patient today -She is going to work on short term disability again due to her anemia.     No orders of the defined types were placed in this encounter.     The total time spent in the appointment was 30-39 minutes  [place], PA-C 05/19/21

## 2021-05-19 ENCOUNTER — Inpatient Hospital Stay: Payer: 59

## 2021-05-19 ENCOUNTER — Other Ambulatory Visit: Payer: Self-pay

## 2021-05-19 ENCOUNTER — Inpatient Hospital Stay: Payer: 59 | Attending: Physician Assistant

## 2021-05-19 ENCOUNTER — Other Ambulatory Visit: Payer: Self-pay | Admitting: Physician Assistant

## 2021-05-19 ENCOUNTER — Inpatient Hospital Stay: Payer: 59 | Admitting: Physician Assistant

## 2021-05-19 VITALS — BP 152/83 | HR 87 | Temp 97.7°F | Resp 20 | Ht 63.0 in | Wt 206.6 lb

## 2021-05-19 DIAGNOSIS — D649 Anemia, unspecified: Secondary | ICD-10-CM

## 2021-05-19 DIAGNOSIS — D509 Iron deficiency anemia, unspecified: Secondary | ICD-10-CM | POA: Insufficient documentation

## 2021-05-19 DIAGNOSIS — E538 Deficiency of other specified B group vitamins: Secondary | ICD-10-CM | POA: Insufficient documentation

## 2021-05-19 DIAGNOSIS — Z86711 Personal history of pulmonary embolism: Secondary | ICD-10-CM | POA: Diagnosis not present

## 2021-05-19 DIAGNOSIS — D500 Iron deficiency anemia secondary to blood loss (chronic): Secondary | ICD-10-CM

## 2021-05-19 DIAGNOSIS — N921 Excessive and frequent menstruation with irregular cycle: Secondary | ICD-10-CM

## 2021-05-19 DIAGNOSIS — N920 Excessive and frequent menstruation with regular cycle: Secondary | ICD-10-CM | POA: Insufficient documentation

## 2021-05-19 LAB — IRON AND IRON BINDING CAPACITY (CC-WL,HP ONLY)
Iron: 38 ug/dL (ref 28–170)
Saturation Ratios: 13 % (ref 10.4–31.8)
TIBC: 304 ug/dL (ref 250–450)
UIBC: 266 ug/dL (ref 148–442)

## 2021-05-19 LAB — CBC WITH DIFFERENTIAL (CANCER CENTER ONLY)
Abs Immature Granulocytes: 0.03 10*3/uL (ref 0.00–0.07)
Basophils Absolute: 0 10*3/uL (ref 0.0–0.1)
Basophils Relative: 0 %
Eosinophils Absolute: 0.2 10*3/uL (ref 0.0–0.5)
Eosinophils Relative: 3 %
HCT: 24.5 % — ABNORMAL LOW (ref 36.0–46.0)
Hemoglobin: 7.7 g/dL — ABNORMAL LOW (ref 12.0–15.0)
Immature Granulocytes: 1 %
Lymphocytes Relative: 25 %
Lymphs Abs: 1.3 10*3/uL (ref 0.7–4.0)
MCH: 30.8 pg (ref 26.0–34.0)
MCHC: 31.4 g/dL (ref 30.0–36.0)
MCV: 98 fL (ref 80.0–100.0)
Monocytes Absolute: 0.3 10*3/uL (ref 0.1–1.0)
Monocytes Relative: 7 %
Neutro Abs: 3.4 10*3/uL (ref 1.7–7.7)
Neutrophils Relative %: 64 %
Platelet Count: 301 10*3/uL (ref 150–400)
RBC: 2.5 MIL/uL — ABNORMAL LOW (ref 3.87–5.11)
RDW: 15.2 % (ref 11.5–15.5)
WBC Count: 5.2 10*3/uL (ref 4.0–10.5)
nRBC: 0 % (ref 0.0–0.2)

## 2021-05-19 LAB — RETIC PANEL
Immature Retic Fract: 33.5 % — ABNORMAL HIGH (ref 2.3–15.9)
RBC.: 2.48 MIL/uL — ABNORMAL LOW (ref 3.87–5.11)
Retic Count, Absolute: 226.7 K/uL — ABNORMAL HIGH (ref 19.0–186.0)
Retic Ct Pct: 9.1 % — ABNORMAL HIGH (ref 0.4–3.1)
Reticulocyte Hemoglobin: 27.9 pg — ABNORMAL LOW (ref >27.9)

## 2021-05-19 LAB — VITAMIN B12: Vitamin B-12: 229 pg/mL (ref 180–914)

## 2021-05-19 LAB — FERRITIN: Ferritin: 22 ng/mL (ref 11–307)

## 2021-05-19 MED ORDER — SODIUM CHLORIDE 0.9 % IV SOLN
400.0000 mg | Freq: Once | INTRAVENOUS | Status: AC
  Administered 2021-05-19: 400 mg via INTRAVENOUS
  Filled 2021-05-19: qty 20

## 2021-05-19 NOTE — Progress Notes (Signed)
Patient did not wait 30 minute post observation. Stated that she has had numerous iron infusions with no issues. Vss upon discharge.

## 2021-05-23 ENCOUNTER — Telehealth: Payer: Self-pay | Admitting: Physician Assistant

## 2021-05-23 NOTE — Telephone Encounter (Signed)
Scheduled per 02/10 los, patient has been called and voicemail was left. 

## 2021-05-26 ENCOUNTER — Inpatient Hospital Stay: Payer: 59

## 2021-05-26 ENCOUNTER — Other Ambulatory Visit: Payer: Self-pay

## 2021-05-26 VITALS — BP 152/78 | HR 78 | Temp 98.2°F | Resp 18

## 2021-05-26 DIAGNOSIS — D509 Iron deficiency anemia, unspecified: Secondary | ICD-10-CM | POA: Diagnosis not present

## 2021-05-26 DIAGNOSIS — D500 Iron deficiency anemia secondary to blood loss (chronic): Secondary | ICD-10-CM

## 2021-05-26 MED ORDER — SODIUM CHLORIDE 0.9 % IV SOLN
400.0000 mg | Freq: Once | INTRAVENOUS | Status: AC
  Administered 2021-05-26: 400 mg via INTRAVENOUS
  Filled 2021-05-26: qty 20

## 2021-05-26 NOTE — Progress Notes (Signed)
Patient did not wait for her 30 minute post observation period VSS upon discharge.

## 2021-05-31 ENCOUNTER — Telehealth: Payer: Self-pay | Admitting: *Deleted

## 2021-05-31 NOTE — Telephone Encounter (Signed)
Received incoming fmla form for patient. This form was [facility] to [facility]. I will fax it to Dr. Burr Medico at [facility]

## 2021-06-02 ENCOUNTER — Inpatient Hospital Stay: Payer: 59

## 2021-06-02 ENCOUNTER — Other Ambulatory Visit: Payer: Self-pay

## 2021-06-02 ENCOUNTER — Telehealth: Payer: Self-pay | Admitting: *Deleted

## 2021-06-02 VITALS — BP 134/58 | HR 75 | Temp 98.5°F | Resp 17

## 2021-06-02 DIAGNOSIS — D500 Iron deficiency anemia secondary to blood loss (chronic): Secondary | ICD-10-CM

## 2021-06-02 DIAGNOSIS — D649 Anemia, unspecified: Secondary | ICD-10-CM

## 2021-06-02 DIAGNOSIS — E538 Deficiency of other specified B group vitamins: Secondary | ICD-10-CM

## 2021-06-02 DIAGNOSIS — D509 Iron deficiency anemia, unspecified: Secondary | ICD-10-CM | POA: Diagnosis not present

## 2021-06-02 LAB — CBC WITH DIFFERENTIAL (CANCER CENTER ONLY)
Abs Immature Granulocytes: 0 10*3/uL (ref 0.00–0.07)
Basophils Absolute: 0 10*3/uL (ref 0.0–0.1)
Basophils Relative: 1 %
Eosinophils Absolute: 0.1 10*3/uL (ref 0.0–0.5)
Eosinophils Relative: 3 %
HCT: 33.1 % — ABNORMAL LOW (ref 36.0–46.0)
Hemoglobin: 10.4 g/dL — ABNORMAL LOW (ref 12.0–15.0)
Immature Granulocytes: 0 %
Lymphocytes Relative: 24 %
Lymphs Abs: 1.1 10*3/uL (ref 0.7–4.0)
MCH: 30.7 pg (ref 26.0–34.0)
MCHC: 31.4 g/dL (ref 30.0–36.0)
MCV: 97.6 fL (ref 80.0–100.0)
Monocytes Absolute: 0.3 10*3/uL (ref 0.1–1.0)
Monocytes Relative: 7 %
Neutro Abs: 3 10*3/uL (ref 1.7–7.7)
Neutrophils Relative %: 65 %
Platelet Count: 273 10*3/uL (ref 150–400)
RBC: 3.39 MIL/uL — ABNORMAL LOW (ref 3.87–5.11)
RDW: 14.6 % (ref 11.5–15.5)
WBC Count: 4.5 10*3/uL (ref 4.0–10.5)
nRBC: 0 % (ref 0.0–0.2)

## 2021-06-02 LAB — SAMPLE TO BLOOD BANK

## 2021-06-02 LAB — VITAMIN B12: Vitamin B-12: 500 pg/mL (ref 180–914)

## 2021-06-02 LAB — FERRITIN: Ferritin: 141 ng/mL (ref 11–307)

## 2021-06-02 MED ORDER — SODIUM CHLORIDE 0.9 % IV SOLN: Freq: Once | INTRAVENOUS | Status: AC

## 2021-06-02 MED ORDER — SODIUM CHLORIDE 0.9 % IV SOLN
400.0000 mg | Freq: Once | INTRAVENOUS | Status: AC
  Administered 2021-06-02: 400 mg via INTRAVENOUS
  Filled 2021-06-02: qty 20

## 2021-06-02 NOTE — Telephone Encounter (Signed)
Staff message sent to [facility] per [employer] patient needs compliance visit between 06/30/21 and 08/28/21.

## 2021-06-02 NOTE — Patient Instructions (Signed)

## 2021-06-02 NOTE — Progress Notes (Signed)
Pt declined to stay for 30 min post obs, discharged with VSS. Ambulatory to lobby

## 2021-06-09 ENCOUNTER — Other Ambulatory Visit: Payer: Self-pay

## 2021-06-09 ENCOUNTER — Inpatient Hospital Stay: Payer: 59 | Attending: Physician Assistant

## 2021-06-09 ENCOUNTER — Inpatient Hospital Stay (HOSPITAL_BASED_OUTPATIENT_CLINIC_OR_DEPARTMENT_OTHER): Payer: 59 | Admitting: Adult Health

## 2021-06-09 ENCOUNTER — Encounter: Payer: Self-pay | Admitting: Adult Health

## 2021-06-09 ENCOUNTER — Inpatient Hospital Stay: Payer: 59

## 2021-06-09 VITALS — BP 121/72 | HR 82 | Temp 98.1°F | Resp 16 | Ht 63.0 in | Wt 206.1 lb

## 2021-06-09 VITALS — BP 133/74 | HR 70 | Temp 98.2°F | Resp 17

## 2021-06-09 DIAGNOSIS — D509 Iron deficiency anemia, unspecified: Secondary | ICD-10-CM | POA: Diagnosis present

## 2021-06-09 DIAGNOSIS — E538 Deficiency of other specified B group vitamins: Secondary | ICD-10-CM | POA: Diagnosis not present

## 2021-06-09 DIAGNOSIS — D500 Iron deficiency anemia secondary to blood loss (chronic): Secondary | ICD-10-CM

## 2021-06-09 DIAGNOSIS — D649 Anemia, unspecified: Secondary | ICD-10-CM

## 2021-06-09 LAB — CBC WITH DIFFERENTIAL (CANCER CENTER ONLY)
Abs Immature Granulocytes: 0.01 10*3/uL (ref 0.00–0.07)
Basophils Absolute: 0 10*3/uL (ref 0.0–0.1)
Basophils Relative: 1 %
Eosinophils Absolute: 0.1 10*3/uL (ref 0.0–0.5)
Eosinophils Relative: 2 %
HCT: 34.3 % — ABNORMAL LOW (ref 36.0–46.0)
Hemoglobin: 11.2 g/dL — ABNORMAL LOW (ref 12.0–15.0)
Immature Granulocytes: 0 %
Lymphocytes Relative: 25 %
Lymphs Abs: 1.2 10*3/uL (ref 0.7–4.0)
MCH: 31.4 pg (ref 26.0–34.0)
MCHC: 32.7 g/dL (ref 30.0–36.0)
MCV: 96.1 fL (ref 80.0–100.0)
Monocytes Absolute: 0.5 10*3/uL (ref 0.1–1.0)
Monocytes Relative: 10 %
Neutro Abs: 3.1 10*3/uL (ref 1.7–7.7)
Neutrophils Relative %: 62 %
Platelet Count: 276 10*3/uL (ref 150–400)
RBC: 3.57 MIL/uL — ABNORMAL LOW (ref 3.87–5.11)
RDW: 13.7 % (ref 11.5–15.5)
WBC Count: 4.9 10*3/uL (ref 4.0–10.5)
nRBC: 0 % (ref 0.0–0.2)

## 2021-06-09 LAB — IRON AND IRON BINDING CAPACITY (CC-WL,HP ONLY)
Iron: 90 ug/dL (ref 28–170)
Saturation Ratios: 34 % — ABNORMAL HIGH (ref 10.4–31.8)
TIBC: 265 ug/dL (ref 250–450)
UIBC: 175 ug/dL (ref 148–442)

## 2021-06-09 LAB — RETIC PANEL
Immature Retic Fract: 10 % (ref 2.3–15.9)
RBC.: 3.62 MIL/uL — ABNORMAL LOW (ref 3.87–5.11)
Retic Count, Absolute: 89.1 10*3/uL (ref 19.0–186.0)
Retic Ct Pct: 2.5 % (ref 0.4–3.1)
Reticulocyte Hemoglobin: 32.4 pg (ref >27.9)

## 2021-06-09 LAB — VITAMIN B12: Vitamin B-12: 372 pg/mL (ref 180–914)

## 2021-06-09 LAB — FERRITIN: Ferritin: 196 ng/mL (ref 11–307)

## 2021-06-09 MED ORDER — SODIUM CHLORIDE 0.9 % IV SOLN
400.0000 mg | Freq: Once | INTRAVENOUS | Status: AC
  Administered 2021-06-09: 400 mg via INTRAVENOUS
  Filled 2021-06-09: qty 20

## 2021-06-09 MED ORDER — SODIUM CHLORIDE 0.9 % IV SOLN: Freq: Once | INTRAVENOUS | Status: AC

## 2021-06-09 NOTE — Assessment & Plan Note (Signed)
Rhonda Ford is a 51 year old woman who is here today for follow-up and assessment of her iron deficiency anemia. ? ?1.  Iron deficiency anemia she is receiving Venofer weekly x4 at 400 mg.  Today will be her final dose and her labs are improving. ? ?2.  History of PE: She has restarted her Eliquis at twice daily.  She denies any blood in her stool black tarry stool and has a negative GI work-up. ? ?3. B12 deficiency she is self injecting every 4 weeks with B12. ?

## 2021-06-09 NOTE — Progress Notes (Signed)
[facility] Follow up:    Rhonda Salter, MD [address] [place] [address]   DIAGNOSIS: Iron deficiency anemia  SUMMARY OF HEMATOLOGIC HISTORY: #1 iron deficiency anemia diagnosed in March 2020: Colonoscopy and EGD in October 2019 was negative; menorrhagia likely the etiology 2.  IV iron administration with Feraheme and Venofer in the past. 3.  B12 deficiency: Receiving B12 injections every 4 weeks at home.   CURRENT THERAPY: Venofer  INTERVAL HISTORY: Rhonda Ford 51 y.o. female returns for follow-up and evaluation of her iron deficiency anemia.  She has been receiving Venofer weekly and will receive her final dose today..  She has tolerated this well thus far.  Her hemoglobin has increased from 7.7 to 11.2.  She notes that she is feeling better and she has had no further vaginal bleeding.  She is hoping that she will go into menopause sooner rather than later so she does not have to undergo an ablation.   Patient Active Problem List   Diagnosis Date Noted   Menorrhagia 05/19/2021   History of cervical dysplasia 02/10/2021   Squamous cell carcinoma of multiple sites 02/10/2021   History of pulmonary embolus (PE) 02/10/2021   Hyperlipidemia 02/10/2021   Obstructive sleep apnea 02/10/2021   Obesity (BMI 35.0-39.9 without comorbidity) 02/10/2021   Vitamin B12 deficiency 02/10/2021   Essential hypertension 02/10/2021   History of stomach ulcers 02/10/2021   Hearing impaired 02/09/2021   Hypothyroidism 11/24/2018   Iron deficiency anemia due to chronic blood loss 06/19/2018   Melanoma ([facility]) 04/09/2016    has No Known Allergies.  MEDICAL HISTORY: Past Medical History:  Diagnosis Date   B12 deficiency    Iron deficiency anemia due to chronic blood loss    Melanoma ([facility])    Migraines    Pulmonary emboli ([place]) 01/2018   Skin cancer    Sleep apnea     SURGICAL HISTORY: Past Surgical History:  Procedure Laterality Date    CESAREAN SECTION     x 4   melanoma surgery      leg   TUBAL LIGATION      SOCIAL HISTORY: Social History   Socioeconomic History   Marital status: Divorced    Spouse name: Not on file   Number of children: 5   Years of education: Not on file   Highest education level: Not on file  Occupational History   Occupation: Optician, dispensing: [employer]    Comment: [employer]  Tobacco Use   Smoking status: Never   Smokeless tobacco: Never  Vaping Use   Vaping Use: Never used  Substance and Sexual Activity   Alcohol use: Never   Drug use: Never   Sexual activity: Yes  Other Topics Concern   Not on file  Social History Narrative   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 Strain: Not on Art therapist Insecurity: Not on file  Transportation Needs: Not on file  Physical Activity: Not on file  Stress: Not on file  Social Connections: Not on file  Intimate Partner Violence: Not on file    FAMILY HISTORY: Family History  Problem Relation Age of Onset   Hypertension Mother    Heart disease Mother    Stroke Mother    Cancer Father        Skin   Stroke Maternal Aunt    Cancer Maternal Aunt 26  breast cancer and NHL   Cancer Paternal Uncle        Melanoma   Stroke Maternal Grandmother    Heart disease Maternal Grandmother    Heart attack Maternal Grandmother    Cancer Maternal Grandfather        bone cancer   Cancer Paternal Grandmother        colon, melanoma   Colon cancer Paternal Grandmother    Thrombosis Neg Hx    Esophageal cancer Neg Hx    Pancreatic cancer Neg Hx    Stomach cancer Neg Hx    Diabetes Neg Hx     Review of Systems  Constitutional:  Negative for appetite change, chills, fatigue, fever and unexpected weight change.  HENT:   Negative for hearing loss, lump/mass and trouble swallowing.   Eyes:  Negative for eye problems and icterus.  Respiratory:  Negative for chest tightness, cough and  shortness of breath.   Cardiovascular:  Negative for chest pain, leg swelling and palpitations.  Gastrointestinal:  Negative for abdominal distention, abdominal pain, constipation, diarrhea, nausea and vomiting.  Endocrine: Negative for hot flashes.  Genitourinary:  Negative for difficulty urinating.   Musculoskeletal:  Negative for arthralgias.  Skin:  Negative for itching and rash.  Neurological:  Negative for dizziness, extremity weakness, headaches and numbness.  Hematological:  Negative for adenopathy. Does not bruise/bleed easily.  Psychiatric/Behavioral:  Negative for depression. The patient is not nervous/anxious.      PHYSICAL EXAMINATION  ECOG PERFORMANCE STATUS: 1 - Symptomatic but completely ambulatory  Vitals:   06/09/21 1212  BP: 121/72  Pulse: 82  Resp: 16  Temp: 98.1 F (36.7 C)  SpO2: 99%    Physical Exam Constitutional:      General: She is not in acute distress.    Appearance: Normal appearance. She is not toxic-appearing.  HENT:     Head: Normocephalic and atraumatic.  Eyes:     General: No scleral icterus. Cardiovascular:     Rate and Rhythm: Normal rate and regular rhythm.     Pulses: Normal pulses.     Heart sounds: Normal heart sounds.  Pulmonary:     Effort: Pulmonary effort is normal.     Breath sounds: Normal breath sounds.  Abdominal:     General: Abdomen is flat. Bowel sounds are normal. There is no distension.     Palpations: Abdomen is soft.     Tenderness: There is no abdominal tenderness.  Musculoskeletal:        General: No swelling.     Cervical back: Neck supple.  Lymphadenopathy:     Cervical: No cervical adenopathy.  Skin:    General: Skin is warm and dry.     Findings: No rash.  Neurological:     General: No focal deficit present.     Mental Status: She is alert.  Psychiatric:        Mood and Affect: Mood normal.        Behavior: Behavior normal.    LABORATORY DATA:  CBC    Component Value Date/Time   WBC 4.9  06/09/2021 1155   WBC 5.6 06/19/2018 1125   RBC 3.62 (L) 06/09/2021 1155   RBC 3.57 (L) 06/09/2021 1155   HGB 11.2 (L) 06/09/2021 1155   HCT 34.3 (L) 06/09/2021 1155   PLT 276 06/09/2021 1155   MCV 96.1 06/09/2021 1155   MCH 31.4 06/09/2021 1155   MCHC 32.7 06/09/2021 1155   RDW 13.7 06/09/2021 1155   LYMPHSABS 1.2  06/09/2021 1155   MONOABS 0.5 06/09/2021 1155   EOSABS 0.1 06/09/2021 1155   BASOSABS 0.0 06/09/2021 1155    CMP     Component Value Date/Time   NA 139 10/19/2020 0920   K 4.2 10/19/2020 0920   CL 103 10/19/2020 0920   CO2 22 10/19/2020 0920   GLUCOSE 110 (H) 10/19/2020 0920   GLUCOSE 95 08/17/2020 1122   BUN 22 10/19/2020 0920   CREATININE 0.58 10/19/2020 0920   CREATININE 0.67 11/21/2018 0935   CALCIUM 9.2 10/19/2020 0920   PROT 6.1 (L) 11/21/2018 0935   ALBUMIN 3.4 (L) 11/21/2018 0935   AST 14 (L) 11/21/2018 0935   ALT 15 11/21/2018 0935   ALKPHOS 69 11/21/2018 0935   BILITOT 0.2 (L) 11/21/2018 0935   GFRNONAA >60 11/21/2018 0935   GFRAA >60 11/21/2018 0935    ASSESSMENT and THERAPY PLAN:   Iron deficiency anemia due to chronic blood loss Rhonda Ford is a 51 year old woman who is here today for follow-up and assessment of her iron deficiency anemia.  1.  Iron deficiency anemia she is receiving Venofer weekly x4 at 400 mg.  Today will be her final dose and her labs are improving.  2.  History of PE: She has restarted her Eliquis at twice daily.  She denies any blood in her stool black tarry stool and has a negative GI work-up.  3. B12 deficiency she is self injecting every 4 weeks with B12.  All questions were answered. The patient knows to call the clinic with any problems, questions or concerns. We can certainly see the patient much sooner if necessary.  Total encounter time: 20 minutes in face-to-face visit time, chart review, lab review, care coordination, order entry, and documentation of the encounter.   Wilber Bihari, NP 06/09/21  12:48 PM Medical Oncology and Hematology [facility] [place], [place] [address] Tel. [phone]    Fax. [phone]   *Total Encounter Time as defined by the Centers for Medicare and Medicaid Services includes, in addition to the face-to-face time of a patient visit (documented in the note above) non-face-to-face time: obtaining and reviewing outside history, ordering and reviewing medications, tests or procedures, care coordination (communications with other health care professionals or caregivers) and documentation in the medical record.

## 2021-06-09 NOTE — Progress Notes (Signed)
Pt denied staying her 30 minute observation period. Vital signs stable.  ?

## 2021-06-09 NOTE — Patient Instructions (Signed)
[facility]  Discharge Instructions: Thank you for choosing [facility] to provide your oncology and hematology care.   If you have a lab appointment with the Cancer Center, please go directly to the Cancer Center and check in at the registration area.   Wear comfortable clothing and clothing appropriate for easy access to any Portacath or PICC line.   We strive to give you quality time with your provider. You may need to reschedule your appointment if you arrive late (15 or more minutes).  Arriving late affects you and other patients whose appointments are after yours.  Also, if you miss three or more appointments without notifying the office, you may be dismissed from the clinic at the provider's discretion.      For prescription refill requests, have your pharmacy contact our office and allow 72 hours for refills to be completed.    Today you received the following chemotherapy and/or immunotherapy agents: Venofer (Iron)      To help prevent nausea and vomiting after your treatment, we encourage you to take your nausea medication as directed.  BELOW ARE SYMPTOMS THAT SHOULD BE REPORTED IMMEDIATELY: *FEVER GREATER THAN 100.4 F (38 C) OR HIGHER *CHILLS OR SWEATING *NAUSEA AND VOMITING THAT IS NOT CONTROLLED WITH YOUR NAUSEA MEDICATION *UNUSUAL SHORTNESS OF BREATH *UNUSUAL BRUISING OR BLEEDING *URINARY PROBLEMS (pain or burning when urinating, or frequent urination) *BOWEL PROBLEMS (unusual diarrhea, constipation, pain near the anus) TENDERNESS IN MOUTH AND THROAT WITH OR WITHOUT PRESENCE OF ULCERS (sore throat, sores in mouth, or a toothache) UNUSUAL RASH, SWELLING OR PAIN  UNUSUAL VAGINAL DISCHARGE OR ITCHING   Items with * indicate a potential emergency and should be followed up as soon as possible or go to the Emergency Department if any problems should occur.  Please show the CHEMOTHERAPY ALERT CARD or IMMUNOTHERAPY ALERT CARD at  check-in to the Emergency Department and triage nurse.  Should you have questions after your visit or need to cancel or reschedule your appointment, please contact [facility]  Dept: [phone]  and follow the prompts.  Office hours are 8:00 a.m. to 4:30 p.m. Monday - Friday. Please note that voicemails left after 4:00 p.m. may not be returned until the following business day.  We are closed weekends and major holidays. You have access to a nurse at all times for urgent questions. Please call the main number to the clinic Dept: [phone] and follow the prompts.   For any non-urgent questions, you may also contact your provider using MyChart. We now offer e-Visits for anyone 18 and older to request care online for non-urgent symptoms. For details visit [email or web address].   Also download the MyChart app! Go to the app store, search "MyChart", open the app, select [facility], and log in with your MyChart username and password.  Due to Covid, a mask is required upon entering the hospital/clinic. If you do not have a mask, one will be given to you upon arrival. For doctor visits, patients may have 1 support Yvanna Vidas aged 18 or older with them. For treatment visits, patients cannot have anyone with them due to current Covid guidelines and our immunocompromised population.  

## 2021-06-12 ENCOUNTER — Telehealth: Payer: Self-pay | Admitting: Adult Health

## 2021-06-12 NOTE — Telephone Encounter (Signed)
Scheduled appointment per 3/3 los. Left message. Patient will be mailed an updated calendar. ?

## 2021-06-14 ENCOUNTER — Encounter: Payer: Self-pay | Admitting: *Deleted

## 2021-06-14 NOTE — Progress Notes (Signed)
Completed [employer] for patient and sent form to Dr. Burr Medico for signature. ?

## 2021-06-20 ENCOUNTER — Telehealth: Payer: Self-pay

## 2021-06-20 NOTE — Telephone Encounter (Signed)
Notified Patient of completion of Accomodation Form. Fax transmission confirmation received. Copy of Form mailed to Patient at current address on file as requested. No other concerns or needs voiced at this time. ?

## 2021-06-28 ENCOUNTER — Other Ambulatory Visit: Payer: Self-pay | Admitting: Hematology

## 2021-06-28 ENCOUNTER — Other Ambulatory Visit: Payer: Self-pay | Admitting: Family Medicine

## 2021-06-28 ENCOUNTER — Other Ambulatory Visit (HOSPITAL_COMMUNITY): Payer: Self-pay

## 2021-06-28 MED ORDER — APIXABAN 5 MG PO TABS
ORAL_TABLET | Freq: Two times a day (BID) | ORAL | 4 refills | Status: DC
  Filled 2021-06-28: qty 60, 30d supply, fill #0
  Filled 2021-08-16: qty 60, 30d supply, fill #1
  Filled 2021-10-26: qty 60, 30d supply, fill #2
  Filled 2022-01-05: qty 60, 30d supply, fill #3
  Filled 2022-03-26 – 2022-04-04 (×2): qty 60, 30d supply, fill #4

## 2021-06-28 MED ORDER — PANTOPRAZOLE SODIUM 20 MG PO TBEC
20.0000 mg | DELAYED_RELEASE_TABLET | Freq: Every day | ORAL | 2 refills | Status: DC
  Filled 2021-06-28: qty 30, 30d supply, fill #0
  Filled 2021-08-16: qty 30, 30d supply, fill #1
  Filled 2021-11-02: qty 30, 30d supply, fill #2

## 2021-07-07 ENCOUNTER — Other Ambulatory Visit (HOSPITAL_COMMUNITY): Payer: Self-pay

## 2021-07-19 LAB — HM PAP SMEAR: HM Pap smear: NORMAL

## 2021-07-19 LAB — HM MAMMOGRAPHY

## 2021-07-20 ENCOUNTER — Encounter: Payer: Self-pay | Admitting: Family Medicine

## 2021-08-07 ENCOUNTER — Ambulatory Visit: Payer: 59 | Admitting: Family Medicine

## 2021-08-07 ENCOUNTER — Telehealth: Payer: Self-pay | Admitting: Family Medicine

## 2021-08-07 NOTE — Telephone Encounter (Signed)
Patient/Caregiver was notified of No Show/Late Cancellation Policy & possible $99 charge. ?Visit was cancelled with reason "No Show/Cancel within 24 hours" for tracking & charging. ? ?Date of APPT: 08/07/21 ?Reason given for no show/late cancellation: NA ?No Show Letter printed & put in outgoing mail (Yes/No): Yes ? ?~~~Route message to admin supervisor and clinical team/CMA~~~ ? ?  ?

## 2021-08-16 ENCOUNTER — Other Ambulatory Visit (HOSPITAL_COMMUNITY): Payer: Self-pay

## 2021-08-17 NOTE — Telephone Encounter (Signed)
1st no show, fee waived, letter sent 

## 2021-08-18 ENCOUNTER — Other Ambulatory Visit (HOSPITAL_COMMUNITY): Payer: Self-pay

## 2021-09-03 ENCOUNTER — Other Ambulatory Visit: Payer: Self-pay | Admitting: Nurse Practitioner

## 2021-09-03 DIAGNOSIS — E538 Deficiency of other specified B group vitamins: Secondary | ICD-10-CM

## 2021-09-03 DIAGNOSIS — D500 Iron deficiency anemia secondary to blood loss (chronic): Secondary | ICD-10-CM

## 2021-09-03 NOTE — Progress Notes (Deleted)
[place]   Telephone:([phone] Fax:(336) [phone]   Clinic Follow up Note   Patient Care Team: Rhonda Ford Salter, MD as PCP - General (Family Medicine) Rhonda Sjogren, MD as Referring Physician (Dermatology) Ford, Rhonda Ford Freer, MD as Consulting Physician (Cardiology) Rhonda Ford Pigg, MD as Consulting Physician (Obstetrics and Gynecology) Rhonda Ford Artist, MD as Consulting Physician (Gastroenterology) Rhonda Sine, MD as Consulting Physician (Sleep Medicine) Rhonda Ford Merle, MD as Consulting Physician (Hematology) 09/03/2021  CHIEF COMPLAINT: Follow up IDA and h/o PE  SUMMARY OF HEMATOLOGIC HISTORY: #1 iron deficiency anemia diagnosed in March 2020: Colonoscopy and EGD in October 2019 was negative; menorrhagia likely the etiology 2.  IV iron administration with Feraheme and Venofer in the past. 3.  B12 deficiency: Receiving B12 injections every 4 weeks at home. 4. H/o PE, on eliquis   CURRENT THERAPY: Venofer PRN, last dose 06/09/21  INTERVAL HISTORY: Rhonda Ford Ford returns for follow up as scheduled. Last seen by my colleague Rhonda Ford Bihari, NP 06/09/21   REVIEW OF SYSTEMS:   Constitutional: Denies fevers, chills or abnormal weight loss Eyes: Denies blurriness of vision Ears, nose, mouth, throat, and face: Denies mucositis or sore throat Respiratory: Denies cough, dyspnea or wheezes Cardiovascular: Denies palpitation, chest discomfort or lower extremity swelling Gastrointestinal:  Denies nausea, heartburn or change in bowel habits Skin: Denies abnormal skin rashes Lymphatics: Denies new lymphadenopathy or easy bruising Neurological:Denies numbness, tingling or new weaknesses Behavioral/Psych: Mood is stable, no new changes  All other systems were reviewed with the patient and are negative.  MEDICAL HISTORY:  Past Medical History:  Diagnosis Date   B12 deficiency    Iron deficiency anemia due to chronic blood loss    Melanoma (HCC)    Migraines     Pulmonary emboli ([place]) 01/2018   Skin cancer    Sleep apnea     SURGICAL HISTORY: Past Surgical History:  Procedure Laterality Date   CESAREAN SECTION     x 4   melanoma surgery      leg   TUBAL LIGATION      I have reviewed the social history and family history with the patient and they are unchanged from previous note.  ALLERGIES:  has No Known Allergies.  MEDICATIONS:  Current Outpatient Medications  Medication Sig Dispense Refill   apixaban (ELIQUIS) 5 MG TABS tablet TAKE 1 TABLET BY MOUTH TWICE DAILY 60 tablet 4   atorvastatin (LIPITOR) 20 MG tablet Take 1 tablet by mouth daily 90 tablet 3   cyanocobalamin (,VITAMIN B-12,) 1000 MCG/ML injection Inject 1 ml (1,000 mcg total) into the muscle once for 1 dose, inject weekly for 8 weeks then once every month as directed. 4 mL 4   Ferrous Sulfate (SLOW FE PO) Take 1 tablet by mouth daily.     fluorouracil (EFUDEX) 5 % cream Apply topically to the affected areas 2 times a day for 2 weeks 40 g 3   levothyroxine (SYNTHROID) 25 MCG tablet TAKE 1 TABLET (25 MCG TOTAL) BY MOUTH DAILY BEFORE BREAKFAST. 90 tablet 2   losartan (COZAAR) 25 MG tablet Take 1 tablet (25 mg total) by mouth daily. 90 tablet 1   pantoprazole (PROTONIX) 20 MG tablet Take 1 tablet (20 mg total) by mouth daily. 30 tablet 2   pantoprazole (PROTONIX) 40 MG tablet pantoprazole 40 mg tablet,delayed release     SYRINGE-NEEDLE, DISP, 3 ML (B-D 3CC LUER-LOK SYR 25GX5/8") 25G X 5/8" 3 ML MISC use as directed with B12 injections (Patient  taking differently: use as directed with B12 injections) 4 each 4   No current facility-administered medications for this visit.    PHYSICAL EXAMINATION: ECOG PERFORMANCE STATUS: {CHL ONC ECOG PS:[id number]}  There were no vitals filed for this visit. There were no vitals filed for this visit.  GENERAL:alert, no distress and comfortable SKIN: skin color, texture, turgor are normal, no rashes or significant lesions EYES: normal,  Conjunctiva are pink and non-injected, sclera clear OROPHARYNX:no exudate, no erythema and lips, buccal mucosa, and tongue normal  NECK: supple, thyroid normal size, non-tender, without nodularity LYMPH:  no palpable lymphadenopathy in the cervical, axillary or inguinal LUNGS: clear to auscultation and percussion with normal breathing effort HEART: regular rate & rhythm and no murmurs and no lower extremity edema ABDOMEN:abdomen soft, non-tender and normal bowel sounds Musculoskeletal:no cyanosis of digits and no clubbing  NEURO: alert & oriented x 3 with fluent speech, no focal motor/sensory deficits  LABORATORY DATA:  I have reviewed the data as listed    Latest Ref Rng & Units 06/09/2021   11:55 AM 06/02/2021    8:29 AM 05/19/2021    9:34 AM  CBC  WBC 4.0 - 10.5 K/uL 4.9   4.5   5.2    Hemoglobin 12.0 - 15.0 g/dL 11.2   10.4   7.7    Hematocrit 36.0 - 46.0 % 34.3   33.1   24.5    Platelets 150 - 400 K/uL 276   273   301          Latest Ref Rng & Units 10/19/2020    9:20 AM 08/17/2020   11:22 AM 11/21/2018    9:35 AM  CMP  Glucose 65 - 99 mg/dL 110   95   99    BUN 6 - 24 mg/dL '22   15   9    '$ Creatinine 0.57 - 1.00 mg/dL 0.58   0.54   0.67    Sodium 134 - 144 mmol/L 139   140   140    Potassium 3.5 - 5.2 mmol/L 4.2   3.8   3.7    Chloride 96 - 106 mmol/L 103   105   107    CO2 20 - 29 mmol/L '22   26   24    '$ Calcium 8.7 - 10.2 mg/dL 9.2   9.3   8.5    Total Protein 6.5 - 8.1 g/dL   6.1    Total Bilirubin 0.3 - 1.2 mg/dL   0.2    Alkaline Phos 38 - 126 U/L   69    AST 15 - 41 U/L   14    ALT 0 - 44 U/L   15        RADIOGRAPHIC STUDIES: I have personally reviewed the radiological images as listed and agreed with the findings in the report. No results found.   ASSESSMENT & PLAN:  No problem-specific Assessment & Plan notes found for this encounter.   No orders of the defined types were placed in this encounter.  All questions were answered. The patient knows to call  the clinic with any problems, questions or concerns. No barriers to learning was detected. I spent {CHL ONC TIME VISIT - EXHBZ:[id number]} counseling the patient face to face. The total time spent in the appointment was {CHL ONC TIME VISIT - OFBPZ:[id number]} and more than 50% was on counseling and review of test results     Rhonda Ford Feeling, NP 09/03/21

## 2021-09-06 ENCOUNTER — Inpatient Hospital Stay: Payer: 59 | Admitting: Nurse Practitioner

## 2021-09-06 ENCOUNTER — Inpatient Hospital Stay: Payer: 59 | Attending: Physician Assistant

## 2021-09-13 ENCOUNTER — Encounter: Payer: Self-pay | Admitting: Family Medicine

## 2021-10-03 ENCOUNTER — Other Ambulatory Visit: Payer: Self-pay | Admitting: Family Medicine

## 2021-10-03 ENCOUNTER — Other Ambulatory Visit (HOSPITAL_COMMUNITY): Payer: Self-pay

## 2021-10-03 DIAGNOSIS — E039 Hypothyroidism, unspecified: Secondary | ICD-10-CM

## 2021-10-03 MED ORDER — LEVOTHYROXINE SODIUM 25 MCG PO TABS
ORAL_TABLET | Freq: Every day | ORAL | 2 refills | Status: DC
  Filled 2021-10-03: qty 30, 30d supply, fill #0
  Filled 2021-11-01: qty 30, 30d supply, fill #1
  Filled 2021-11-02 – 2022-04-04 (×3): qty 30, 30d supply, fill #2
  Filled 2022-06-22 – 2022-09-13 (×2): qty 30, 30d supply, fill #3

## 2021-10-11 ENCOUNTER — Other Ambulatory Visit: Payer: Self-pay

## 2021-10-11 DIAGNOSIS — D500 Iron deficiency anemia secondary to blood loss (chronic): Secondary | ICD-10-CM

## 2021-10-11 DIAGNOSIS — E538 Deficiency of other specified B group vitamins: Secondary | ICD-10-CM

## 2021-10-12 ENCOUNTER — Inpatient Hospital Stay: Payer: 59 | Admitting: Nurse Practitioner

## 2021-10-12 ENCOUNTER — Inpatient Hospital Stay: Payer: 59 | Attending: Physician Assistant

## 2021-10-12 ENCOUNTER — Other Ambulatory Visit: Payer: Self-pay | Admitting: Nurse Practitioner

## 2021-10-12 DIAGNOSIS — Z85820 Personal history of malignant melanoma of skin: Secondary | ICD-10-CM | POA: Insufficient documentation

## 2021-10-12 DIAGNOSIS — E538 Deficiency of other specified B group vitamins: Secondary | ICD-10-CM | POA: Insufficient documentation

## 2021-10-12 DIAGNOSIS — D500 Iron deficiency anemia secondary to blood loss (chronic): Secondary | ICD-10-CM

## 2021-10-12 DIAGNOSIS — Z7901 Long term (current) use of anticoagulants: Secondary | ICD-10-CM | POA: Insufficient documentation

## 2021-10-12 DIAGNOSIS — Z86711 Personal history of pulmonary embolism: Secondary | ICD-10-CM | POA: Insufficient documentation

## 2021-10-12 DIAGNOSIS — R5383 Other fatigue: Secondary | ICD-10-CM | POA: Insufficient documentation

## 2021-10-12 DIAGNOSIS — R600 Localized edema: Secondary | ICD-10-CM | POA: Insufficient documentation

## 2021-10-12 DIAGNOSIS — D509 Iron deficiency anemia, unspecified: Secondary | ICD-10-CM | POA: Insufficient documentation

## 2021-10-12 NOTE — Progress Notes (Deleted)
[place]   Telephone:(336) [phone] Fax:(336) [phone]   Clinic Follow up Note   Patient Care Team: Haydee Salter, MD as PCP - General (Family Medicine) Levy Sjogren, MD as Referring Physician (Dermatology) O'Neal, Cassie Freer, MD as Consulting Physician (Cardiology) Newton Pigg, MD as Consulting Physician (Obstetrics and Gynecology) Ladene Artist, MD as Consulting Physician (Gastroenterology) Troy Sine, MD as Consulting Physician (Sleep Medicine) Truitt Merle, MD as Consulting Physician (Hematology) 10/12/2021  CHIEF COMPLAINT: Follow up [place] HISTORY: Oncology History   No history exists.    CURRENT THERAPY: IV Venofer PRN and monthly B12 inj at home  INTERVAL HISTORY: Ms. Massey returns for follow up as scheduled. Last seen by my colleague Ria Comment, NP 06/09/21 while receiving IV Venofer x4, that day was her final dose. She continues B12 injections at home q4 weeks.    REVIEW OF SYSTEMS:   Constitutional: Denies fevers, chills or abnormal weight loss Eyes: Denies blurriness of vision Ears, nose, mouth, throat, and face: Denies mucositis or sore throat Respiratory: Denies cough, dyspnea or wheezes Cardiovascular: Denies palpitation, chest discomfort or lower extremity swelling Gastrointestinal:  Denies nausea, heartburn or change in bowel habits Skin: Denies abnormal skin rashes Lymphatics: Denies new lymphadenopathy or easy bruising Neurological:Denies numbness, tingling or new weaknesses Behavioral/Psych: Mood is stable, no new changes  All other systems were reviewed with the patient and are negative.  MEDICAL HISTORY:  Past Medical History:  Diagnosis Date   B12 deficiency    Iron deficiency anemia due to chronic blood loss    Melanoma (HCC)    Migraines    Pulmonary emboli ([place]) 01/2018   Skin cancer    Sleep apnea     SURGICAL HISTORY: Past Surgical History:  Procedure Laterality Date    CESAREAN SECTION     x 4   melanoma surgery      leg   TUBAL LIGATION      I have reviewed the social history and family history with the patient and they are unchanged from previous note.  ALLERGIES:  has No Known Allergies.  MEDICATIONS:  Current Outpatient Medications  Medication Sig Dispense Refill   apixaban (ELIQUIS) 5 MG TABS tablet TAKE 1 TABLET BY MOUTH TWICE DAILY 60 tablet 4   atorvastatin (LIPITOR) 20 MG tablet Take 1 tablet by mouth daily 90 tablet 3   cyanocobalamin (,VITAMIN B-12,) 1000 MCG/ML injection Inject 1 ml (1,000 mcg total) into the muscle once for 1 dose, inject weekly for 8 weeks then once every month as directed. 4 mL 4   Ferrous Sulfate (SLOW FE PO) Take 1 tablet by mouth daily.     fluorouracil (EFUDEX) 5 % cream Apply topically to the affected areas 2 times a day for 2 weeks 40 g 3   levothyroxine (SYNTHROID) 25 MCG tablet TAKE 1 TABLET (25 MCG TOTAL) BY MOUTH DAILY BEFORE BREAKFAST. 90 tablet 2   losartan (COZAAR) 25 MG tablet Take 1 tablet (25 mg total) by mouth daily. 90 tablet 1   pantoprazole (PROTONIX) 20 MG tablet Take 1 tablet (20 mg total) by mouth daily. 30 tablet 2   pantoprazole (PROTONIX) 40 MG tablet pantoprazole 40 mg tablet,delayed release     SYRINGE-NEEDLE, DISP, 3 ML (B-D 3CC LUER-LOK SYR 25GX5/8") 25G X 5/8" 3 ML MISC use as directed with B12 injections (Patient taking differently: use as directed with B12 injections) 4 each 4   No current facility-administered medications for this visit.  PHYSICAL EXAMINATION: ECOG PERFORMANCE STATUS: {CHL ONC ECOG PS:[id number]}  There were no vitals filed for this visit. There were no vitals filed for this visit.  GENERAL:alert, no distress and comfortable SKIN: skin color, texture, turgor are normal, no rashes or significant lesions EYES: normal, Conjunctiva are pink and non-injected, sclera clear OROPHARYNX:no exudate, no erythema and lips, buccal mucosa, and tongue normal  NECK: supple,  thyroid normal size, non-tender, without nodularity LYMPH:  no palpable lymphadenopathy in the cervical, axillary or inguinal LUNGS: clear to auscultation and percussion with normal breathing effort HEART: regular rate & rhythm and no murmurs and no lower extremity edema ABDOMEN:abdomen soft, non-tender and normal bowel sounds Musculoskeletal:no cyanosis of digits and no clubbing  NEURO: alert & oriented x 3 with fluent speech, no focal motor/sensory deficits  LABORATORY DATA:  I have reviewed the data as listed    Latest Ref Rng & Units 06/09/2021   11:55 AM 06/02/2021    8:29 AM 05/19/2021    9:34 AM  CBC  WBC 4.0 - 10.5 K/uL 4.9  4.5  5.2   Hemoglobin 12.0 - 15.0 g/dL 11.2  10.4  7.7   Hematocrit 36.0 - 46.0 % 34.3  33.1  24.5   Platelets 150 - 400 K/uL 276  273  301         Latest Ref Rng & Units 10/19/2020    9:20 AM 08/17/2020   11:22 AM 11/21/2018    9:35 AM  CMP  Glucose 65 - 99 mg/dL 110  95  99   BUN 6 - 24 mg/dL '22  15  9   '$ Creatinine 0.57 - 1.00 mg/dL 0.58  0.54  0.67   Sodium 134 - 144 mmol/L 139  140  140   Potassium 3.5 - 5.2 mmol/L 4.2  3.8  3.7   Chloride 96 - 106 mmol/L 103  105  107   CO2 20 - 29 mmol/L '22  26  24   '$ Calcium 8.7 - 10.2 mg/dL 9.2  9.3  8.5   Total Protein 6.5 - 8.1 g/dL   6.1   Total Bilirubin 0.3 - 1.2 mg/dL   0.2   Alkaline Phos 38 - 126 U/L   69   AST 15 - 41 U/L   14   ALT 0 - 44 U/L   15       RADIOGRAPHIC STUDIES: I have personally reviewed the radiological images as listed and agreed with the findings in the report. No results found.   ASSESSMENT & PLAN:  No problem-specific Assessment & Plan notes found for this encounter.   No orders of the defined types were placed in this encounter.  All questions were answered. The patient knows to call the clinic with any problems, questions or concerns. No barriers to learning was detected. I spent {CHL ONC TIME VISIT - ZDGLO:[id number]} counseling the patient face to face. The total  time spent in the appointment was {CHL ONC TIME VISIT - OACZY:[id number]} and more than 50% was on counseling and review of test results     Alla Feeling, NP 10/12/21

## 2021-10-26 ENCOUNTER — Other Ambulatory Visit (HOSPITAL_COMMUNITY): Payer: Self-pay

## 2021-10-26 ENCOUNTER — Other Ambulatory Visit: Payer: Self-pay | Admitting: Family Medicine

## 2021-10-30 ENCOUNTER — Telehealth: Payer: Self-pay | Admitting: Hematology

## 2021-10-30 NOTE — Telephone Encounter (Signed)
Per 7/24 phone line pt called and said she did not feel well.  Ok per nurse to schedule with Dr Burr Medico with lab

## 2021-10-31 ENCOUNTER — Inpatient Hospital Stay: Payer: 59

## 2021-10-31 ENCOUNTER — Telehealth: Payer: Self-pay

## 2021-10-31 ENCOUNTER — Inpatient Hospital Stay: Payer: 59 | Admitting: Hematology

## 2021-10-31 ENCOUNTER — Other Ambulatory Visit: Payer: Self-pay

## 2021-10-31 ENCOUNTER — Encounter: Payer: Self-pay | Admitting: Hematology

## 2021-10-31 VITALS — BP 116/65 | HR 78 | Temp 98.4°F | Resp 17 | Ht 63.0 in | Wt 203.4 lb

## 2021-10-31 VITALS — BP 126/84 | HR 73 | Temp 98.3°F | Resp 17

## 2021-10-31 DIAGNOSIS — E538 Deficiency of other specified B group vitamins: Secondary | ICD-10-CM

## 2021-10-31 DIAGNOSIS — D500 Iron deficiency anemia secondary to blood loss (chronic): Secondary | ICD-10-CM

## 2021-10-31 DIAGNOSIS — R5383 Other fatigue: Secondary | ICD-10-CM | POA: Diagnosis not present

## 2021-10-31 DIAGNOSIS — D649 Anemia, unspecified: Secondary | ICD-10-CM

## 2021-10-31 DIAGNOSIS — Z86711 Personal history of pulmonary embolism: Secondary | ICD-10-CM | POA: Diagnosis not present

## 2021-10-31 DIAGNOSIS — D509 Iron deficiency anemia, unspecified: Secondary | ICD-10-CM | POA: Diagnosis not present

## 2021-10-31 DIAGNOSIS — Z85820 Personal history of malignant melanoma of skin: Secondary | ICD-10-CM | POA: Diagnosis not present

## 2021-10-31 DIAGNOSIS — R600 Localized edema: Secondary | ICD-10-CM

## 2021-10-31 DIAGNOSIS — Z7901 Long term (current) use of anticoagulants: Secondary | ICD-10-CM | POA: Diagnosis not present

## 2021-10-31 LAB — IRON AND IRON BINDING CAPACITY (CC-WL,HP ONLY)
Iron: 43 ug/dL (ref 28–170)
Saturation Ratios: 17 % (ref 10.4–31.8)
TIBC: 259 ug/dL (ref 250–450)
UIBC: 216 ug/dL (ref 148–442)

## 2021-10-31 LAB — RETIC PANEL
Immature Retic Fract: 38.8 % — ABNORMAL HIGH (ref 2.3–15.9)
RBC.: 2.26 MIL/uL — ABNORMAL LOW (ref 3.87–5.11)
Retic Count, Absolute: 186.5 10*3/uL — ABNORMAL HIGH (ref 19.0–186.0)
Retic Ct Pct: 8.3 % — ABNORMAL HIGH (ref 0.4–3.1)
Reticulocyte Hemoglobin: 28 pg (ref >27.9)

## 2021-10-31 LAB — CBC WITH DIFFERENTIAL (CANCER CENTER ONLY)
Abs Immature Granulocytes: 0.06 10*3/uL (ref 0.00–0.07)
Basophils Absolute: 0 10*3/uL (ref 0.0–0.1)
Basophils Relative: 0 %
Eosinophils Absolute: 0.2 10*3/uL (ref 0.0–0.5)
Eosinophils Relative: 3 %
HCT: 21.9 % — ABNORMAL LOW (ref 36.0–46.0)
Hemoglobin: 7.1 g/dL — ABNORMAL LOW (ref 12.0–15.0)
Immature Granulocytes: 1 %
Lymphocytes Relative: 22 %
Lymphs Abs: 1.3 10*3/uL (ref 0.7–4.0)
MCH: 30.7 pg (ref 26.0–34.0)
MCHC: 32.4 g/dL (ref 30.0–36.0)
MCV: 94.8 fL (ref 80.0–100.0)
Monocytes Absolute: 0.4 10*3/uL (ref 0.1–1.0)
Monocytes Relative: 7 %
Neutro Abs: 3.7 10*3/uL (ref 1.7–7.7)
Neutrophils Relative %: 67 %
Platelet Count: 286 10*3/uL (ref 150–400)
RBC: 2.31 MIL/uL — ABNORMAL LOW (ref 3.87–5.11)
RDW: 16 % — ABNORMAL HIGH (ref 11.5–15.5)
WBC Count: 5.6 10*3/uL (ref 4.0–10.5)
nRBC: 0.7 % — ABNORMAL HIGH (ref 0.0–0.2)

## 2021-10-31 LAB — FERRITIN: Ferritin: 16 ng/mL (ref 11–307)

## 2021-10-31 LAB — PREPARE RBC (CROSSMATCH)

## 2021-10-31 LAB — VITAMIN B12: Vitamin B-12: 218 pg/mL (ref 180–914)

## 2021-10-31 MED ORDER — DIPHENHYDRAMINE HCL 25 MG PO CAPS
25.0000 mg | ORAL_CAPSULE | Freq: Once | ORAL | Status: AC
  Administered 2021-10-31: 25 mg via ORAL
  Filled 2021-10-31: qty 1

## 2021-10-31 MED ORDER — ACETAMINOPHEN 325 MG PO TABS
650.0000 mg | ORAL_TABLET | Freq: Once | ORAL | Status: AC
  Administered 2021-10-31: 650 mg via ORAL
  Filled 2021-10-31: qty 2

## 2021-10-31 MED ORDER — SODIUM CHLORIDE 0.9 % IV SOLN
300.0000 mg | Freq: Once | INTRAVENOUS | Status: AC
  Administered 2021-10-31: 300 mg via INTRAVENOUS
  Filled 2021-10-31: qty 300

## 2021-10-31 MED ORDER — SODIUM CHLORIDE 0.9 % IV SOLN: Freq: Once | INTRAVENOUS | Status: AC

## 2021-10-31 NOTE — Addendum Note (Signed)
Addended by: Truitt Merle on: 10/31/2021 04:29 PM   Modules accepted: Orders

## 2021-10-31 NOTE — Telephone Encounter (Signed)
Per Dr. Burr Medico, pt will get Venofer '300mg'$  on 10/31/2021 and will get Venofer '200mg'$  on 11/08/2021 & 11/15/2021.

## 2021-10-31 NOTE — Progress Notes (Addendum)
[place]   Telephone:([phone] Fax:(336) [phone]   Clinic Follow up Note   Patient Care Team: Haydee Salter, MD as PCP - General (Family Medicine) Levy Sjogren, MD as Referring Physician (Dermatology) O'Neal, Cassie Freer, MD as Consulting Physician (Cardiology) Newton Pigg, MD as Consulting Physician (Obstetrics and Gynecology) Ladene Artist, MD as Consulting Physician (Gastroenterology) Troy Sine, MD as Consulting Physician (Sleep Medicine) Truitt Merle, MD as Consulting Physician (Hematology)  Date of Service:  10/31/2021  CHIEF COMPLAINT: f/u of iron deficient anemia, h/o PE  CURRENT THERAPY:  -IV Feraheme as needed since 06/2018. Due to insurance she was switched to IV Venofer 400 on 07/15/19 -oral iron and oral B12 -monthly B12 injection since 06/25/18. Changed to at home injections after 03/07/20.   ASSESSMENT & PLAN:  Rhonda Ford is a 51 y.o. female with   1. Iron deficiency anemia, B12 deficiency -Likely secondary to menorrhagia. Unclear etiology of B12 deficiency, her intrinsic factor was negative -per pt, h/o anemia for many years, with hgb 7.5-10, no work up done until after PE in late 2019. -Workup done by Dr. Walden Field in 06/2018 is consistent with iron deficiency anemia.  -colonoscopy and EGD in 01/2018 were normal except stomach ulcer, no H. Pylori. -she started IV iron as needed in 06/2018, last was IV Venofer on 06/09/21. -she continues B12 injections monthly at home. -pt is likely perimenopausal, as her periods are becoming more sporadic. -last period was ~3 weeks ago (early 10/2021).  -she did not come in for her last visit 2 months ago. Her hgb is down to 7.1 today. She reports fatigue and is visibly pale today. We will plan for blood transfusion ASAP.  -We will continue lab and IV Venofer 400 mg every 2 months.  I encouraged her to comply with her appointment to avoid blood transfusion.   2. H/o Pulmonary Emboli,  Unprovoked  -Occurred in 01/2018. No PE seen on 07/03/18 Angio CT chest. Started Eliquis 06/2018. Given this was significant, unprovoked PE and no family history of PE, it has been recommended to continue indefinitely.  -Continue Eliquis, okay to hold 2 to 3 days during menstrual period due to menorrhagia.   3. 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 She is followed by dermatology who she saw when she lived in [place].  -She has recent recurrence of squamous cell (2021). She is currently being treated with topical cream by Dermatologist.    4. B/l leg edema -new lately -She had remote history of PE. -will order Doppler to rule out DVT.   PLAN:  -Blood transfusion 2hr tomorrow and iv venofer '300mg'$  today -IV venofer '400mg'$  in 1 and 2 weeks -lab in 2 weeks  -lab and venofer '400mg'$  in 2, 4 and 6 months -f/u in 6 months    No problem-specific Assessment & Plan notes found for this encounter.   INTERVAL HISTORY:  Rhonda Ford is here for a follow up of anemia. She was last seen by NP Mendel Ryder on 06/09/21. She presents to the clinic alone. She reports she was feeling well until about 3 weeks ago when her period restarted. She also reports her legs/ankles are swollen lately, not completely resolved over night.   All other systems were reviewed with the patient and are negative.  MEDICAL HISTORY:  Past Medical History:  Diagnosis Date   B12 deficiency    Iron deficiency anemia due to chronic  blood loss    Melanoma ([place])    Migraines    Pulmonary emboli ([place]) 01/2018   Skin cancer    Sleep apnea     SURGICAL HISTORY: Past Surgical History:  Procedure Laterality Date   CESAREAN SECTION     x 4   melanoma surgery      leg   TUBAL LIGATION      I have reviewed the social history and family history with the patient and they are unchanged from previous note.  ALLERGIES:  has No Known Allergies.  MEDICATIONS:  Current  Outpatient Medications  Medication Sig Dispense Refill   apixaban (ELIQUIS) 5 MG TABS tablet TAKE 1 TABLET BY MOUTH TWICE DAILY 60 tablet 4   atorvastatin (LIPITOR) 20 MG tablet Take 1 tablet by mouth daily 90 tablet 3   cyanocobalamin (,VITAMIN B-12,) 1000 MCG/ML injection Inject 1 ml (1,000 mcg total) into the muscle once for 1 dose, inject weekly for 8 weeks then once every month as directed. 4 mL 4   Ferrous Sulfate (SLOW FE PO) Take 1 tablet by mouth daily.     fluorouracil (EFUDEX) 5 % cream Apply topically to the affected areas 2 times a day for 2 weeks 40 g 3   levothyroxine (SYNTHROID) 25 MCG tablet TAKE 1 TABLET (25 MCG TOTAL) BY MOUTH DAILY BEFORE BREAKFAST. 90 tablet 2   losartan (COZAAR) 25 MG tablet Take 1 tablet (25 mg total) by mouth daily. 90 tablet 1   pantoprazole (PROTONIX) 20 MG tablet Take 1 tablet (20 mg total) by mouth daily. 30 tablet 2   pantoprazole (PROTONIX) 40 MG tablet pantoprazole 40 mg tablet,delayed release     SYRINGE-NEEDLE, DISP, 3 ML (B-D 3CC LUER-LOK SYR 25GX5/8") 25G X 5/8" 3 ML MISC use as directed with B12 injections (Patient taking differently: use as directed with B12 injections) 4 each 4   No current facility-administered medications for this visit.    PHYSICAL EXAMINATION: ECOG PERFORMANCE STATUS: 1 - Symptomatic but completely ambulatory  Vitals:   10/31/21 1127  BP: 116/65  Pulse: 78  Resp: 17  Temp: 98.4 F (36.9 C)  SpO2: 100%   Wt Readings from Last 3 Encounters:  10/31/21 203 lb 6.4 oz (92.3 kg)  06/09/21 206 lb 1.6 oz (93.5 kg)  05/19/21 206 lb 9.6 oz (93.7 kg)     GENERAL:alert, no distress and comfortable SKIN: (+) skin color pale; no rashes or significant lesions EYES: normal, Conjunctiva are pink and non-injected, sclera clear  HEART: (+) bilateral LE edema NEURO: alert & oriented x 3 with fluent speech  LABORATORY DATA:  I have reviewed the data as listed    Latest Ref Rng & Units 10/31/2021   10:56 AM 06/09/2021    11:55 AM 06/02/2021    8:29 AM  CBC  WBC 4.0 - 10.5 K/uL 5.6  4.9  4.5   Hemoglobin 12.0 - 15.0 g/dL 7.1  11.2  10.4   Hematocrit 36.0 - 46.0 % 21.9  34.3  33.1   Platelets 150 - 400 K/uL 286  276  273         Latest Ref Rng & Units 10/19/2020    9:20 AM 08/17/2020   11:22 AM 11/21/2018    9:35 AM  CMP  Glucose 65 - 99 mg/dL 110  95  99   BUN 6 - 24 mg/dL '22  15  9   '$ Creatinine 0.57 - 1.00 mg/dL 0.58  0.54  0.67   Sodium 134 -  144 mmol/L 139  140  140   Potassium 3.5 - 5.2 mmol/L 4.2  3.8  3.7   Chloride 96 - 106 mmol/L 103  105  107   CO2 20 - 29 mmol/L '22  26  24   '$ Calcium 8.7 - 10.2 mg/dL 9.2  9.3  8.5   Total Protein 6.5 - 8.1 g/dL   6.1   Total Bilirubin 0.3 - 1.2 mg/dL   0.2   Alkaline Phos 38 - 126 U/L   69   AST 15 - 41 U/L   14   ALT 0 - 44 U/L   15       RADIOGRAPHIC STUDIES: I have personally reviewed the radiological images as listed and agreed with the findings in the report. No results found.    Orders Placed This Encounter  Procedures   Informed Consent Details: Physician/Practitioner Attestation; Transcribe to consent form and obtain patient signature    Order Specific Question:   Physician/Practitioner attestation of informed consent for blood and or blood product transfusion    Answer:   I, the physician/practitioner, attest that I have discussed with the patient the benefits, risks, side effects, alternatives, likelihood of achieving goals and potential problems during recovery for the procedure that I have provided informed consent.    Order Specific Question:   Product(s)    Answer:   All Product(s)   All questions were answered. The patient knows to call the clinic with any problems, questions or concerns. No barriers to learning was detected. The total time spent in the appointment was 30 minutes.     Truitt Merle, MD 10/31/2021   I, Wilburn Mylar, am acting as scribe for Truitt Merle, MD.   I have reviewed the above documentation for accuracy and  completeness, and I agree with the above.

## 2021-10-31 NOTE — Telephone Encounter (Signed)
Spoke with pt via telephone to inform pt of her appt in Vascular US (Doppler) on 11/01/2021 '@10am'$  at [facility].  Informed pt that Dr. Burr Medico ordered an vascular US on her bilateral LE to r/o DVT.  Pt verbalized understanding and confirmed her appt.

## 2021-10-31 NOTE — Progress Notes (Signed)
1 unit of PRBCs order for transfusion on 11/01/2021.

## 2021-11-01 ENCOUNTER — Ambulatory Visit (HOSPITAL_BASED_OUTPATIENT_CLINIC_OR_DEPARTMENT_OTHER)
Admission: RE | Admit: 2021-11-01 | Discharge: 2021-11-01 | Disposition: A | Discharge status: Home / Self Care | Payer: 59 | Source: Ambulatory Visit | Attending: Hematology | Admitting: Hematology

## 2021-11-01 ENCOUNTER — Inpatient Hospital Stay: Payer: 59

## 2021-11-01 ENCOUNTER — Other Ambulatory Visit (HOSPITAL_COMMUNITY): Payer: Self-pay

## 2021-11-01 ENCOUNTER — Other Ambulatory Visit: Payer: Self-pay | Admitting: Cardiovascular Disease

## 2021-11-01 DIAGNOSIS — I10 Essential (primary) hypertension: Secondary | ICD-10-CM

## 2021-11-01 DIAGNOSIS — R600 Localized edema: Secondary | ICD-10-CM | POA: Insufficient documentation

## 2021-11-01 DIAGNOSIS — D500 Iron deficiency anemia secondary to blood loss (chronic): Secondary | ICD-10-CM

## 2021-11-01 DIAGNOSIS — D649 Anemia, unspecified: Secondary | ICD-10-CM

## 2021-11-01 DIAGNOSIS — D509 Iron deficiency anemia, unspecified: Secondary | ICD-10-CM | POA: Diagnosis not present

## 2021-11-01 MED ORDER — ACETAMINOPHEN 325 MG PO TABS
650.0000 mg | ORAL_TABLET | Freq: Once | ORAL | Status: AC
  Administered 2021-11-01: 650 mg via ORAL
  Filled 2021-11-01: qty 2

## 2021-11-01 MED ORDER — LOSARTAN POTASSIUM 25 MG PO TABS
25.0000 mg | ORAL_TABLET | Freq: Every day | ORAL | 1 refills | Status: DC
  Filled 2021-11-01: qty 30, 30d supply, fill #0
  Filled 2021-11-02 – 2022-01-05 (×2): qty 30, 30d supply, fill #1
  Filled 2022-03-26 – 2022-04-04 (×2): qty 30, 30d supply, fill #2
  Filled 2022-05-21: qty 30, 30d supply, fill #3
  Filled 2022-07-02 – 2022-08-03 (×2): qty 30, 30d supply, fill #4
  Filled 2022-09-13: qty 30, 30d supply, fill #5

## 2021-11-01 MED ORDER — SODIUM CHLORIDE 0.9% IV SOLUTION
250.0000 mL | Freq: Once | INTRAVENOUS | Status: AC
  Administered 2021-11-01: 250 mL via INTRAVENOUS

## 2021-11-01 MED ORDER — DIPHENHYDRAMINE HCL 25 MG PO CAPS
25.0000 mg | ORAL_CAPSULE | Freq: Once | ORAL | Status: AC
  Administered 2021-11-01: 25 mg via ORAL
  Filled 2021-11-01: qty 1

## 2021-11-02 ENCOUNTER — Encounter: Payer: Self-pay | Admitting: Hematology

## 2021-11-02 ENCOUNTER — Other Ambulatory Visit (HOSPITAL_COMMUNITY): Payer: Self-pay

## 2021-11-02 ENCOUNTER — Other Ambulatory Visit: Payer: Self-pay

## 2021-11-02 DIAGNOSIS — D649 Anemia, unspecified: Secondary | ICD-10-CM

## 2021-11-02 DIAGNOSIS — E538 Deficiency of other specified B group vitamins: Secondary | ICD-10-CM

## 2021-11-02 LAB — TYPE AND SCREEN
ABO/RH(D): O POS
Antibody Screen: NEGATIVE
Unit division: 0

## 2021-11-02 LAB — BPAM RBC
Blood Product Expiration Date: 202308242359
ISSUE DATE / TIME: 202307261121
Unit Type and Rh: 5100

## 2021-11-02 MED ORDER — CYANOCOBALAMIN 1000 MCG/ML IJ SOLN
INTRAMUSCULAR | 8 refills | Status: DC
  Filled 2021-11-02: qty 1, 21d supply, fill #0
  Filled 2022-03-26 – 2022-04-04 (×2): qty 1, 21d supply, fill #1

## 2021-11-02 NOTE — Progress Notes (Signed)
Pt's vitamin B-12 injections were changed to every 3 wks per verbal order from Dr. Burr Medico based off pt's recent lab results.  Pt's vitamin B-12 is still low.

## 2021-11-03 ENCOUNTER — Telehealth: Payer: Self-pay | Admitting: Hematology

## 2021-11-03 NOTE — Telephone Encounter (Signed)
Left message with follow-up appointment per 7/25 los.

## 2021-11-09 ENCOUNTER — Other Ambulatory Visit: Payer: Self-pay

## 2021-11-09 ENCOUNTER — Inpatient Hospital Stay: Payer: Self-pay | Attending: Physician Assistant

## 2021-11-09 VITALS — BP 139/79 | HR 71 | Temp 97.8°F | Resp 16

## 2021-11-09 DIAGNOSIS — D500 Iron deficiency anemia secondary to blood loss (chronic): Secondary | ICD-10-CM

## 2021-11-09 DIAGNOSIS — E538 Deficiency of other specified B group vitamins: Secondary | ICD-10-CM | POA: Insufficient documentation

## 2021-11-09 DIAGNOSIS — D509 Iron deficiency anemia, unspecified: Secondary | ICD-10-CM | POA: Insufficient documentation

## 2021-11-09 MED ORDER — SODIUM CHLORIDE 0.9 % IV SOLN: Freq: Once | INTRAVENOUS | Status: AC

## 2021-11-09 MED ORDER — ACETAMINOPHEN 325 MG PO TABS
650.0000 mg | ORAL_TABLET | Freq: Once | ORAL | Status: AC
  Administered 2021-11-09: 650 mg via ORAL
  Filled 2021-11-09: qty 2

## 2021-11-09 MED ORDER — DIPHENHYDRAMINE HCL 25 MG PO CAPS
25.0000 mg | ORAL_CAPSULE | Freq: Once | ORAL | Status: AC
  Administered 2021-11-09: 25 mg via ORAL
  Filled 2021-11-09: qty 1

## 2021-11-09 MED ORDER — SODIUM CHLORIDE 0.9 % IV SOLN
300.0000 mg | Freq: Once | INTRAVENOUS | Status: AC
  Administered 2021-11-09: 300 mg via INTRAVENOUS
  Filled 2021-11-09: qty 300

## 2021-11-09 NOTE — Progress Notes (Signed)
Patient tolerated her iron well today- has had many doses previously and done well. Patient chose not to stay for the 30 minute observation- VSS. BP 139/79 (BP Location: Left Arm, Patient Position: Sitting)   Pulse 71   Temp 97.8 F (36.6 C) (Oral)   Resp 16   LMP 11/01/2021   SpO2 100%

## 2021-11-09 NOTE — Patient Instructions (Signed)
[facility]  Discharge Instructions: Thank you for choosing [facility] to provide your oncology and hematology care.   If you have a lab appointment with the [facility], please go directly to the [facility] and check in at the registration area.   Wear comfortable clothing and clothing appropriate for easy access to any Portacath or PICC line.   We strive to give you quality time with your provider. You may need to reschedule your appointment if you arrive late (15 or more minutes).  Arriving late affects you and other patients whose appointments are after yours.  Also, if you miss three or more appointments without notifying the office, you may be dismissed from the clinic at the provider's discretion.      For prescription refill requests, have your pharmacy contact our office and allow 72 hours for refills to be completed.    Today you received the following chemotherapy and/or immunotherapy agents: Venofer (Iron)      To help prevent nausea and vomiting after your treatment, we encourage you to take your nausea medication as directed.  BELOW ARE SYMPTOMS THAT SHOULD BE REPORTED IMMEDIATELY: *FEVER GREATER THAN 100.4 F (38 C) OR HIGHER *CHILLS OR SWEATING *NAUSEA AND VOMITING THAT IS NOT CONTROLLED WITH YOUR NAUSEA MEDICATION *UNUSUAL SHORTNESS OF BREATH *UNUSUAL BRUISING OR BLEEDING *URINARY PROBLEMS (pain or burning when urinating, or frequent urination) *BOWEL PROBLEMS (unusual diarrhea, constipation, pain near the anus) TENDERNESS IN MOUTH AND THROAT WITH OR WITHOUT PRESENCE OF ULCERS (sore throat, sores in mouth, or a toothache) UNUSUAL RASH, SWELLING OR PAIN  UNUSUAL VAGINAL DISCHARGE OR ITCHING   Items with * indicate a potential emergency and should be followed up as soon as possible or go to the Emergency Department if any problems should occur.  Please show the CHEMOTHERAPY ALERT CARD or IMMUNOTHERAPY ALERT CARD at  check-in to the Emergency Department and triage nurse.  Should you have questions after your visit or need to cancel or reschedule your appointment, please contact [facility]  Dept: [phone]  and follow the prompts.  Office hours are 8:00 a.m. to 4:30 p.m. Monday - Friday. Please note that voicemails left after 4:00 p.m. may not be returned until the following business day.  We are closed weekends and major holidays. You have access to a nurse at all times for urgent questions. Please call the main number to the clinic Dept: [phone] and follow the prompts.   For any non-urgent questions, you may also contact your provider using MyChart. We now offer e-Visits for anyone 23 and older to request care online for non-urgent symptoms. For details visit [email or web address].   Also download the MyChart app! Go to the app store, search "MyChart", open the app, select [facility], and log in with your MyChart username and password.  Due to Covid, a mask is required upon entering the hospital/clinic. If you do not have a mask, one will be given to you upon arrival. For doctor visits, patients may have 1 support person aged 67 or older with them. For treatment visits, patients cannot have anyone with them due to current Covid guidelines and our immunocompromised population.

## 2021-11-14 ENCOUNTER — Encounter: Payer: Self-pay | Admitting: Hematology

## 2021-11-15 ENCOUNTER — Other Ambulatory Visit (HOSPITAL_COMMUNITY): Payer: Self-pay

## 2021-11-16 ENCOUNTER — Inpatient Hospital Stay: Payer: Self-pay

## 2021-11-16 ENCOUNTER — Other Ambulatory Visit: Payer: Self-pay

## 2021-11-22 ENCOUNTER — Telehealth: Payer: Self-pay | Admitting: Hematology

## 2021-11-22 NOTE — Telephone Encounter (Signed)
Rescheduled appointment per patient via telephone call. Left voicemail with updated appointment information.

## 2021-11-23 ENCOUNTER — Inpatient Hospital Stay: Payer: Self-pay

## 2021-11-24 ENCOUNTER — Other Ambulatory Visit: Payer: Self-pay

## 2021-11-24 ENCOUNTER — Inpatient Hospital Stay: Payer: Self-pay

## 2021-11-24 NOTE — Progress Notes (Signed)
Pt reported to inf today for Blood and Venofer when she checked in she was told she had no insurance coverage. Pt refused tx today d/t this. Pt stated " I have no SOB, dizziness, or weakness, I feel great for me".  RN educated Pt to f/u with MD if symptoms arise. Pt verbalized understanding.  MD made aware.  Pt ambulatory to lobby in stable condition w/ no complaints.

## 2021-12-15 ENCOUNTER — Inpatient Hospital Stay: Payer: Self-pay

## 2022-01-01 ENCOUNTER — Inpatient Hospital Stay: Payer: Self-pay

## 2022-01-05 ENCOUNTER — Other Ambulatory Visit: Payer: Self-pay | Admitting: Family Medicine

## 2022-01-05 ENCOUNTER — Other Ambulatory Visit: Payer: Self-pay | Admitting: Cardiovascular Disease

## 2022-01-05 ENCOUNTER — Encounter: Payer: Self-pay | Admitting: Hematology

## 2022-01-05 ENCOUNTER — Other Ambulatory Visit (HOSPITAL_COMMUNITY): Payer: Self-pay

## 2022-01-05 MED ORDER — PANTOPRAZOLE SODIUM 20 MG PO TBEC
20.0000 mg | DELAYED_RELEASE_TABLET | Freq: Every day | ORAL | 0 refills | Status: DC
  Filled 2022-01-05: qty 30, 30d supply, fill #0

## 2022-01-05 MED FILL — Atorvastatin Calcium Tab 20 MG (Base Equivalent): ORAL | 30 days supply | Qty: 30 | Fill #0 | Status: AC

## 2022-01-12 ENCOUNTER — Other Ambulatory Visit (HOSPITAL_COMMUNITY): Payer: Self-pay

## 2022-03-05 ENCOUNTER — Inpatient Hospital Stay: Payer: 59 | Attending: Physician Assistant

## 2022-03-05 ENCOUNTER — Inpatient Hospital Stay: Payer: 59

## 2022-03-26 ENCOUNTER — Other Ambulatory Visit: Payer: Self-pay | Admitting: Family Medicine

## 2022-03-26 ENCOUNTER — Other Ambulatory Visit (HOSPITAL_COMMUNITY): Payer: Self-pay

## 2022-03-26 ENCOUNTER — Encounter: Payer: Self-pay | Admitting: Hematology

## 2022-03-26 MED ORDER — PANTOPRAZOLE SODIUM 20 MG PO TBEC
20.0000 mg | DELAYED_RELEASE_TABLET | Freq: Every day | ORAL | 3 refills | Status: DC
  Filled 2022-03-26 – 2022-04-04 (×2): qty 90, 90d supply, fill #0
  Filled 2022-07-02 – 2022-08-03 (×2): qty 90, 90d supply, fill #1
  Filled 2022-10-08: qty 90, 90d supply, fill #2

## 2022-03-27 ENCOUNTER — Encounter (HOSPITAL_COMMUNITY): Payer: Self-pay

## 2022-03-27 ENCOUNTER — Other Ambulatory Visit (HOSPITAL_COMMUNITY): Payer: Self-pay

## 2022-03-27 ENCOUNTER — Other Ambulatory Visit: Payer: Self-pay

## 2022-04-04 ENCOUNTER — Other Ambulatory Visit: Payer: Self-pay

## 2022-04-04 ENCOUNTER — Other Ambulatory Visit (HOSPITAL_COMMUNITY): Payer: Self-pay

## 2022-05-02 NOTE — Assessment & Plan Note (Signed)
Likely secondary to menorrhagia.  -per pt, h/o anemia for many years, with hgb 7.5-10, no work up done until after PE in late 2019. -Workup done by Dr. Walden Field in 06/2018 is consistent with iron deficiency anemia.  -colonoscopy and EGD in 01/2018 were normal except stomach ulcer, no H. Pylori. -she started IV iron as needed in 06/2018

## 2022-05-02 NOTE — Progress Notes (Unsigned)
[facility]   Telephone:(336) [phone] Fax:(336) [phone]   Clinic Follow up Note   Patient Care Team: Haydee Salter, MD as PCP - General (Family Medicine) Levy Sjogren, MD as Referring Physician (Dermatology) O'Neal, Cassie Freer, MD as Consulting Physician (Cardiology) Newton Pigg, MD as Consulting Physician (Obstetrics and Gynecology) Ladene Artist, MD as Consulting Physician (Gastroenterology) Troy Sine, MD as Consulting Physician (Sleep Medicine) Truitt Merle, MD as Consulting Physician (Hematology)  Date of Service:  05/03/2022  CHIEF COMPLAINT: f/u of  iron deficient anemia, h/o PE   CURRENT THERAPY:   -IV Feraheme as needed since 06/2018. Due to insurance she was switched to IV Venofer 400 on 07/15/19, if ferritin<30 -oral iron and oral B12 -monthly B12 injection since 06/25/18. Changed to at home injections after 03/07/20.   ASSESSMENT:  Rhonda Ford is a 52 y.o. female with   Iron deficiency anemia due to chronic blood loss Likely secondary to menorrhagia.  -per pt, h/o anemia for many years, with hgb 7.5-10, no work up done until after PE in late 2019. -Workup done by Dr. Walden Field in 06/2018 is consistent with iron deficiency anemia.  -colonoscopy and EGD in 01/2018 were normal except stomach ulcer, no H. Pylori. -she started IV iron as needed in 06/2018  Vitamin B12 deficiency Unclear etiology of B12 deficiency, her intrinsic factor was negative -She will continue monthly B12 injection at home.  History of pulmonary embolus (PE) -Occurred in 01/2018. No PE seen on 07/03/18 Angio CT chest. Started Eliquis 06/2018. Given this was significant, unprovoked PE and no family history of PE, it has been recommended to continue indefinitely.  -Continue Eliquis, okay to hold 2 to 3 days during menstrual period due to menorrhagia.    PLAN: - reviewed labs - monitor labs every 3 months f/u in 1 year, iv iron as needed  - Venofer  infusion today    INTERVAL HISTORY:  Rhonda Ford is here for a follow up of   iron deficient anemia, h/o PE She was last seen by me on 10/31/2021 She presents to the clinic alone, pt reports feeling good and has good energy level, labs are pending, pt reports irregular cycles, no heavy cycles in the past 6 months,    All other systems were reviewed with the patient and are negative.  MEDICAL HISTORY:  Past Medical History:  Diagnosis Date   B12 deficiency    Iron deficiency anemia due to chronic blood loss    Melanoma ([facility])    Migraines    Pulmonary emboli ([place]) 01/2018   Skin cancer    Sleep apnea     SURGICAL HISTORY: Past Surgical History:  Procedure Laterality Date   CESAREAN SECTION     x 4   melanoma surgery      leg   TUBAL LIGATION      I have reviewed the social history and family history with the patient and they are unchanged from previous note.  ALLERGIES:  has No Known Allergies.  MEDICATIONS:  Current Outpatient Medications  Medication Sig Dispense Refill   apixaban (ELIQUIS) 5 MG TABS tablet TAKE 1 TABLET BY MOUTH TWICE DAILY 60 tablet 4   atorvastatin (LIPITOR) 20 MG tablet Take 1 tablet (20 mg total) by mouth daily. SCHEDULE OFFICE VISIT FOR FUTURE REFILLS. 30 tablet 0   cyanocobalamin (VITAMIN B12) 1000 MCG/ML injection Inject 1 ml into the muscle once for 1 dose every month 1 mL 11   Ferrous  Sulfate (SLOW FE PO) Take 1 tablet by mouth daily.     fluorouracil (EFUDEX) 5 % cream Apply topically to the affected areas 2 times a day for 2 weeks 40 g 3   levothyroxine (SYNTHROID) 25 MCG tablet TAKE 1 TABLET (25 MCG TOTAL) BY MOUTH DAILY BEFORE BREAKFAST. 90 tablet 2   losartan (COZAAR) 25 MG tablet Take 1 tablet (25 mg total) by mouth daily. 90 tablet 1   pantoprazole (PROTONIX) 20 MG tablet Take 1 tablet (20 mg total) by mouth daily. Needs appointment for further refills. 90 tablet 3   pantoprazole (PROTONIX) 40 MG tablet pantoprazole 40 mg  tablet,delayed release     SYRINGE-NEEDLE, DISP, 3 ML (B-D 3CC LUER-LOK SYR 25GX5/8") 25G X 5/8" 3 ML MISC use as directed with B12 injections 4 each 3   No current facility-administered medications for this visit.   Facility-Administered Medications Ordered in Other Visits  Medication Dose Route Frequency Provider Last Rate Last Admin   iron sucrose (VENOFER) 300 mg in sodium chloride 0.9 % 250 mL IVPB  300 mg Intravenous Once Truitt Merle, MD 176.7 mL/hr at 05/03/22 1325 300 mg at 05/03/22 1325    PHYSICAL EXAMINATION: ECOG PERFORMANCE STATUS: 0 - Asymptomatic  Vitals:   05/03/22 1227  BP: 133/75  Pulse: 69  Resp: 18  Temp: 97.9 F (36.6 C)  SpO2: 100%   Wt Readings from Last 3 Encounters:  05/03/22 194 lb 6.4 oz (88.2 kg)  10/31/21 203 lb 6.4 oz (92.3 kg)  06/09/21 206 lb 1.6 oz (93.5 kg)     GENERAL:alert, no distress and comfortable SKIN: skin color, texture, turgor are normal, no rashes or significant lesions Musculoskeletal:no cyanosis of digits and no clubbing  NEURO: alert & oriented x 3 with fluent speech, no focal motor/sensory deficits  LABORATORY DATA:  I have reviewed the data as listed    Latest Ref Rng & Units 05/03/2022   12:10 PM 10/31/2021   10:56 AM 06/09/2021   11:55 AM  CBC  WBC 4.0 - 10.5 K/uL 4.7  5.6  4.9   Hemoglobin 12.0 - 15.0 g/dL 11.9  7.1  11.2   Hematocrit 36.0 - 46.0 % 35.2  21.9  34.3   Platelets 150 - 400 K/uL 224  286  276         Latest Ref Rng & Units 10/19/2020    9:20 AM 08/17/2020   11:22 AM 11/21/2018    9:35 AM  CMP  Glucose 65 - 99 mg/dL 110  95  99   BUN 6 - 24 mg/dL '22  15  9   '$ Creatinine 0.57 - 1.00 mg/dL 0.58  0.54  0.67   Sodium 134 - 144 mmol/L 139  140  140   Potassium 3.5 - 5.2 mmol/L 4.2  3.8  3.7   Chloride 96 - 106 mmol/L 103  105  107   CO2 20 - 29 mmol/L '22  26  24   '$ Calcium 8.7 - 10.2 mg/dL 9.2  9.3  8.5   Total Protein 6.5 - 8.1 g/dL   6.1   Total Bilirubin 0.3 - 1.2 mg/dL   0.2   Alkaline Phos 38 - 126  U/L   69   AST 15 - 41 U/L   14   ALT 0 - 44 U/L   15       RADIOGRAPHIC STUDIES: I have personally reviewed the radiological images as listed and agreed with the findings in the report. No results  found.    No orders of the defined types were placed in this encounter.  All questions were answered. The patient knows to call the clinic with any problems, questions or concerns. No barriers to learning was detected. The total time spent in the appointment was 15 minutes.     Truitt Merle, MD 05/03/2022   Felicity Coyer, CMA, am acting as scribe for Truitt Merle, MD.   I have reviewed the above documentation for accuracy and completeness, and I agree with the above.

## 2022-05-02 NOTE — Assessment & Plan Note (Signed)
-  Occurred in 01/2018. No PE seen on 07/03/18 Angio CT chest. Started Eliquis 06/2018. Given this was significant, unprovoked PE and no family history of PE, it has been recommended to continue indefinitely.  -Continue Eliquis, okay to hold 2 to 3 days during menstrual period due to menorrhagia.

## 2022-05-02 NOTE — Assessment & Plan Note (Signed)
Unclear etiology of B12 deficiency, her intrinsic factor was negative -She will continue monthly B12 injection at home.

## 2022-05-03 ENCOUNTER — Encounter: Payer: Self-pay | Admitting: Hematology

## 2022-05-03 ENCOUNTER — Other Ambulatory Visit (HOSPITAL_COMMUNITY): Payer: Self-pay

## 2022-05-03 ENCOUNTER — Inpatient Hospital Stay: Payer: BC Managed Care – PPO

## 2022-05-03 ENCOUNTER — Inpatient Hospital Stay: Payer: BC Managed Care – PPO | Attending: Physician Assistant | Admitting: Hematology

## 2022-05-03 ENCOUNTER — Other Ambulatory Visit: Payer: Self-pay

## 2022-05-03 VITALS — BP 133/75 | HR 69 | Temp 97.9°F | Resp 18 | Ht 63.0 in | Wt 194.4 lb

## 2022-05-03 DIAGNOSIS — Z7901 Long term (current) use of anticoagulants: Secondary | ICD-10-CM | POA: Insufficient documentation

## 2022-05-03 DIAGNOSIS — E538 Deficiency of other specified B group vitamins: Secondary | ICD-10-CM

## 2022-05-03 DIAGNOSIS — D649 Anemia, unspecified: Secondary | ICD-10-CM | POA: Diagnosis not present

## 2022-05-03 DIAGNOSIS — D500 Iron deficiency anemia secondary to blood loss (chronic): Secondary | ICD-10-CM

## 2022-05-03 DIAGNOSIS — D509 Iron deficiency anemia, unspecified: Secondary | ICD-10-CM | POA: Diagnosis not present

## 2022-05-03 DIAGNOSIS — Z86711 Personal history of pulmonary embolism: Secondary | ICD-10-CM

## 2022-05-03 LAB — CBC WITH DIFFERENTIAL (CANCER CENTER ONLY)
Abs Immature Granulocytes: 0.01 10*3/uL (ref 0.00–0.07)
Basophils Absolute: 0 10*3/uL (ref 0.0–0.1)
Basophils Relative: 0 %
Eosinophils Absolute: 0.1 10*3/uL (ref 0.0–0.5)
Eosinophils Relative: 3 %
HCT: 35.2 % — ABNORMAL LOW (ref 36.0–46.0)
Hemoglobin: 11.9 g/dL — ABNORMAL LOW (ref 12.0–15.0)
Immature Granulocytes: 0 %
Lymphocytes Relative: 28 %
Lymphs Abs: 1.3 10*3/uL (ref 0.7–4.0)
MCH: 30.1 pg (ref 26.0–34.0)
MCHC: 33.8 g/dL (ref 30.0–36.0)
MCV: 88.9 fL (ref 80.0–100.0)
Monocytes Absolute: 0.4 10*3/uL (ref 0.1–1.0)
Monocytes Relative: 7 %
Neutro Abs: 2.9 10*3/uL (ref 1.7–7.7)
Neutrophils Relative %: 62 %
Platelet Count: 224 10*3/uL (ref 150–400)
RBC: 3.96 MIL/uL (ref 3.87–5.11)
RDW: 14 % (ref 11.5–15.5)
WBC Count: 4.7 10*3/uL (ref 4.0–10.5)
nRBC: 0 % (ref 0.0–0.2)

## 2022-05-03 LAB — IRON AND IRON BINDING CAPACITY (CC-WL,HP ONLY)
Iron: 120 ug/dL (ref 28–170)
Saturation Ratios: 39 % — ABNORMAL HIGH (ref 10.4–31.8)
TIBC: 308 ug/dL (ref 250–450)
UIBC: 188 ug/dL (ref 148–442)

## 2022-05-03 LAB — SAMPLE TO BLOOD BANK

## 2022-05-03 LAB — FERRITIN: Ferritin: 5 ng/mL — ABNORMAL LOW (ref 11–307)

## 2022-05-03 MED ORDER — CYANOCOBALAMIN 1000 MCG/ML IJ SOLN
INTRAMUSCULAR | 11 refills | Status: DC
  Filled 2022-05-03: qty 1, 30d supply, fill #0
  Filled 2022-09-13: qty 1, 30d supply, fill #1
  Filled 2022-10-08: qty 1, 30d supply, fill #2
  Filled 2022-11-01: qty 1, 30d supply, fill #3
  Filled 2023-01-31: qty 1, 30d supply, fill #4
  Filled 2023-03-27: qty 1, 30d supply, fill #5

## 2022-05-03 MED ORDER — "BD LUER-LOK SYRINGE 25G X 5/8"" 3 ML MISC"
3 refills | Status: DC
  Filled 2022-05-03: qty 4, fill #0
  Filled 2022-09-13: qty 4, 28d supply, fill #0
  Filled 2022-10-08: qty 4, 28d supply, fill #1
  Filled 2022-11-28: qty 4, 28d supply, fill #2
  Filled 2023-01-31: qty 4, 28d supply, fill #3

## 2022-05-03 MED ORDER — DIPHENHYDRAMINE HCL 25 MG PO CAPS
25.0000 mg | ORAL_CAPSULE | Freq: Once | ORAL | Status: AC
  Administered 2022-05-03: 25 mg via ORAL
  Filled 2022-05-03: qty 1

## 2022-05-03 MED ORDER — ACETAMINOPHEN 325 MG PO TABS
650.0000 mg | ORAL_TABLET | Freq: Once | ORAL | Status: AC
  Administered 2022-05-03: 650 mg via ORAL
  Filled 2022-05-03: qty 2

## 2022-05-03 MED ORDER — SODIUM CHLORIDE 0.9 % IV SOLN: Freq: Once | INTRAVENOUS | Status: AC

## 2022-05-03 MED ORDER — SODIUM CHLORIDE 0.9 % IV SOLN
300.0000 mg | Freq: Once | INTRAVENOUS | Status: AC
  Administered 2022-05-03: 300 mg via INTRAVENOUS
  Filled 2022-05-03: qty 200

## 2022-05-04 ENCOUNTER — Other Ambulatory Visit: Payer: Self-pay

## 2022-05-04 ENCOUNTER — Other Ambulatory Visit (HOSPITAL_COMMUNITY): Payer: Self-pay

## 2022-05-04 ENCOUNTER — Telehealth: Payer: Self-pay | Admitting: Hematology

## 2022-05-04 NOTE — Telephone Encounter (Signed)
Spoke with patient confirming all upcoming appointments  

## 2022-05-07 ENCOUNTER — Telehealth: Payer: Self-pay | Admitting: *Deleted

## 2022-05-07 NOTE — Telephone Encounter (Signed)
Notified of message below. OK to receive more iron. Message to scheduler.

## 2022-05-07 NOTE — Telephone Encounter (Signed)
-----  Message from Truitt Merle, MD sent at 05/06/2022 12:45 PM EST ----- Please let pt know her iron is low, she received venofer '300mg'$  on 1/25, I recommend two more additional doses, please schedule if she agrees. Thanks   Truitt Merle

## 2022-05-17 ENCOUNTER — Other Ambulatory Visit: Payer: Self-pay

## 2022-05-18 MED FILL — Iron Sucrose Inj 20 MG/ML (Fe Equiv): INTRAVENOUS | Qty: 15 | Status: AC

## 2022-05-19 ENCOUNTER — Inpatient Hospital Stay: Payer: BC Managed Care – PPO | Attending: Physician Assistant

## 2022-05-19 VITALS — BP 134/78 | HR 68 | Temp 98.1°F | Resp 16

## 2022-05-19 DIAGNOSIS — D500 Iron deficiency anemia secondary to blood loss (chronic): Secondary | ICD-10-CM | POA: Diagnosis not present

## 2022-05-19 MED ORDER — SODIUM CHLORIDE 0.9 % IV SOLN
300.0000 mg | Freq: Once | INTRAVENOUS | Status: AC
  Administered 2022-05-19: 300 mg via INTRAVENOUS
  Filled 2022-05-19: qty 300

## 2022-05-19 MED ORDER — SODIUM CHLORIDE 0.9 % IV SOLN: Freq: Once | INTRAVENOUS | Status: AC

## 2022-05-19 MED ORDER — DIPHENHYDRAMINE HCL 25 MG PO CAPS
25.0000 mg | ORAL_CAPSULE | Freq: Once | ORAL | Status: AC
  Administered 2022-05-19: 25 mg via ORAL
  Filled 2022-05-19: qty 1

## 2022-05-19 NOTE — Progress Notes (Signed)
Patient declined to stay for 30 minute post-observation period following Venofer infusion.  Patient's vital signs retaken prior to discharge and remained within normal parameters.  Patient discharged in stable condition.

## 2022-05-19 NOTE — Patient Instructions (Signed)

## 2022-05-21 ENCOUNTER — Other Ambulatory Visit (HOSPITAL_COMMUNITY): Payer: Self-pay

## 2022-05-21 ENCOUNTER — Other Ambulatory Visit: Payer: Self-pay | Admitting: Hematology

## 2022-05-21 ENCOUNTER — Other Ambulatory Visit: Payer: Self-pay | Admitting: Cardiovascular Disease

## 2022-05-21 MED ORDER — APIXABAN 5 MG PO TABS
5.0000 mg | ORAL_TABLET | Freq: Two times a day (BID) | ORAL | 4 refills | Status: DC
  Filled 2022-05-21: qty 60, 30d supply, fill #0
  Filled 2022-07-02 – 2022-08-03 (×2): qty 60, 30d supply, fill #1
  Filled 2022-09-13: qty 60, 30d supply, fill #2
  Filled 2022-10-08: qty 60, 30d supply, fill #3
  Filled 2022-11-01 – 2022-11-28 (×2): qty 60, 30d supply, fill #4

## 2022-05-22 ENCOUNTER — Other Ambulatory Visit: Payer: Self-pay

## 2022-05-22 ENCOUNTER — Other Ambulatory Visit (HOSPITAL_COMMUNITY): Payer: Self-pay

## 2022-05-22 MED ORDER — ATORVASTATIN CALCIUM 20 MG PO TABS
20.0000 mg | ORAL_TABLET | Freq: Every day | ORAL | 0 refills | Status: DC
  Filled 2022-05-22: qty 15, 15d supply, fill #0

## 2022-05-24 ENCOUNTER — Inpatient Hospital Stay: Payer: BC Managed Care – PPO

## 2022-06-01 ENCOUNTER — Other Ambulatory Visit: Payer: Self-pay

## 2022-06-01 ENCOUNTER — Inpatient Hospital Stay: Payer: BC Managed Care – PPO

## 2022-06-01 VITALS — BP 128/81 | HR 73 | Temp 97.9°F | Resp 16

## 2022-06-01 DIAGNOSIS — D500 Iron deficiency anemia secondary to blood loss (chronic): Secondary | ICD-10-CM | POA: Diagnosis not present

## 2022-06-01 MED ORDER — SODIUM CHLORIDE 0.9 % IV SOLN: Freq: Once | INTRAVENOUS | Status: AC

## 2022-06-01 MED ORDER — DIPHENHYDRAMINE HCL 25 MG PO CAPS: 25.0000 mg | ORAL_CAPSULE | Freq: Once | ORAL | Status: DC

## 2022-06-01 MED ORDER — SODIUM CHLORIDE 0.9 % IV SOLN
300.0000 mg | Freq: Once | INTRAVENOUS | Status: AC
  Administered 2022-06-01: 300 mg via INTRAVENOUS
  Filled 2022-06-01: qty 300

## 2022-06-01 NOTE — Patient Instructions (Signed)

## 2022-06-22 ENCOUNTER — Other Ambulatory Visit (HOSPITAL_COMMUNITY): Payer: Self-pay

## 2022-06-22 ENCOUNTER — Encounter: Payer: Self-pay | Admitting: Hematology

## 2022-06-22 ENCOUNTER — Other Ambulatory Visit: Payer: Self-pay | Admitting: Family Medicine

## 2022-06-25 ENCOUNTER — Encounter (HOSPITAL_COMMUNITY): Payer: Self-pay

## 2022-06-25 ENCOUNTER — Other Ambulatory Visit (HOSPITAL_COMMUNITY): Payer: Self-pay

## 2022-06-28 ENCOUNTER — Other Ambulatory Visit (HOSPITAL_COMMUNITY): Payer: Self-pay

## 2022-06-29 ENCOUNTER — Other Ambulatory Visit: Payer: Self-pay

## 2022-07-02 ENCOUNTER — Other Ambulatory Visit (HOSPITAL_COMMUNITY): Payer: Self-pay

## 2022-07-02 ENCOUNTER — Encounter: Payer: Self-pay | Admitting: Hematology

## 2022-07-05 ENCOUNTER — Other Ambulatory Visit (HOSPITAL_COMMUNITY): Payer: Self-pay

## 2022-07-19 ENCOUNTER — Encounter: Payer: Self-pay | Admitting: Hematology

## 2022-08-02 ENCOUNTER — Encounter: Payer: Self-pay | Admitting: Hematology

## 2022-08-02 ENCOUNTER — Inpatient Hospital Stay: Payer: Commercial Managed Care - PPO | Attending: Physician Assistant

## 2022-08-02 ENCOUNTER — Inpatient Hospital Stay: Payer: Commercial Managed Care - PPO

## 2022-08-03 ENCOUNTER — Encounter: Payer: Self-pay | Admitting: Hematology

## 2022-08-03 ENCOUNTER — Other Ambulatory Visit (HOSPITAL_COMMUNITY): Payer: Self-pay

## 2022-09-13 ENCOUNTER — Other Ambulatory Visit (HOSPITAL_COMMUNITY): Payer: Self-pay

## 2022-10-08 ENCOUNTER — Other Ambulatory Visit: Payer: Self-pay | Admitting: Family Medicine

## 2022-10-08 ENCOUNTER — Other Ambulatory Visit: Payer: Self-pay | Admitting: Cardiovascular Disease

## 2022-10-08 DIAGNOSIS — E039 Hypothyroidism, unspecified: Secondary | ICD-10-CM

## 2022-10-08 DIAGNOSIS — I10 Essential (primary) hypertension: Secondary | ICD-10-CM

## 2022-10-08 MED ORDER — LEVOTHYROXINE SODIUM 25 MCG PO TABS
25.0000 ug | ORAL_TABLET | Freq: Every day | ORAL | 0 refills | Status: DC
Start: 2022-10-08 — End: 2022-11-28
  Filled 2022-10-08: qty 30, 30d supply, fill #0

## 2022-10-09 ENCOUNTER — Other Ambulatory Visit (HOSPITAL_COMMUNITY): Payer: Self-pay

## 2022-10-09 ENCOUNTER — Other Ambulatory Visit: Payer: Self-pay

## 2022-10-09 ENCOUNTER — Encounter: Payer: Self-pay | Admitting: Hematology

## 2022-10-09 NOTE — Telephone Encounter (Signed)
My chart message sent to patient to make an appointment. Dm/cma

## 2022-10-09 NOTE — Telephone Encounter (Signed)
Spoke to patient and she is at work today and will call  us tomorrow to schedule an appointment for a f/u.   Dm/cma

## 2022-10-25 ENCOUNTER — Encounter: Payer: Self-pay | Admitting: Hematology

## 2022-11-01 ENCOUNTER — Inpatient Hospital Stay: Payer: BC Managed Care – PPO

## 2022-11-01 ENCOUNTER — Inpatient Hospital Stay: Payer: BC Managed Care – PPO | Attending: Physician Assistant

## 2022-11-01 ENCOUNTER — Other Ambulatory Visit: Payer: Self-pay

## 2022-11-01 DIAGNOSIS — D500 Iron deficiency anemia secondary to blood loss (chronic): Secondary | ICD-10-CM | POA: Insufficient documentation

## 2022-11-01 DIAGNOSIS — D649 Anemia, unspecified: Secondary | ICD-10-CM

## 2022-11-01 DIAGNOSIS — E538 Deficiency of other specified B group vitamins: Secondary | ICD-10-CM | POA: Diagnosis not present

## 2022-11-01 LAB — IRON AND IRON BINDING CAPACITY (CC-WL,HP ONLY)
Iron: 99 ug/dL (ref 28–170)
Saturation Ratios: 36 % — ABNORMAL HIGH (ref 10.4–31.8)
TIBC: 273 ug/dL (ref 250–450)
UIBC: 174 ug/dL (ref 148–442)

## 2022-11-01 LAB — CBC WITH DIFFERENTIAL (CANCER CENTER ONLY)
Abs Immature Granulocytes: 0 10*3/uL (ref 0.00–0.07)
Basophils Absolute: 0 10*3/uL (ref 0.0–0.1)
Basophils Relative: 1 %
Eosinophils Absolute: 0.1 10*3/uL (ref 0.0–0.5)
Eosinophils Relative: 2 %
HCT: 42.6 % (ref 36.0–46.0)
Hemoglobin: 14.5 g/dL (ref 12.0–15.0)
Immature Granulocytes: 0 %
Lymphocytes Relative: 22 %
Lymphs Abs: 1.3 10*3/uL (ref 0.7–4.0)
MCH: 31.1 pg (ref 26.0–34.0)
MCHC: 34 g/dL (ref 30.0–36.0)
MCV: 91.4 fL (ref 80.0–100.0)
Monocytes Absolute: 0.4 10*3/uL (ref 0.1–1.0)
Monocytes Relative: 7 %
Neutro Abs: 4 10*3/uL (ref 1.7–7.7)
Neutrophils Relative %: 68 %
Platelet Count: 288 10*3/uL (ref 150–400)
RBC: 4.66 MIL/uL (ref 3.87–5.11)
RDW: 12.7 % (ref 11.5–15.5)
WBC Count: 5.8 10*3/uL (ref 4.0–10.5)
nRBC: 0 % (ref 0.0–0.2)

## 2022-11-01 LAB — FERRITIN: Ferritin: 46 ng/mL (ref 11–307)

## 2022-11-02 ENCOUNTER — Encounter: Payer: Self-pay | Admitting: Hematology

## 2022-11-15 ENCOUNTER — Encounter: Payer: Self-pay | Admitting: Nurse Practitioner

## 2022-11-28 ENCOUNTER — Other Ambulatory Visit: Payer: Self-pay | Admitting: Cardiovascular Disease

## 2022-11-28 ENCOUNTER — Other Ambulatory Visit (HOSPITAL_COMMUNITY): Payer: Self-pay

## 2022-11-28 ENCOUNTER — Ambulatory Visit (INDEPENDENT_AMBULATORY_CARE_PROVIDER_SITE_OTHER)
Admission: RE | Admit: 2022-11-28 | Discharge: 2022-11-28 | Disposition: A | Discharge status: Home / Self Care | Payer: BC Managed Care – PPO | Source: Ambulatory Visit | Attending: Family Medicine | Admitting: Family Medicine

## 2022-11-28 ENCOUNTER — Ambulatory Visit: Payer: BC Managed Care – PPO | Admitting: Family Medicine

## 2022-11-28 ENCOUNTER — Other Ambulatory Visit: Payer: Self-pay

## 2022-11-28 ENCOUNTER — Other Ambulatory Visit: Payer: Self-pay | Admitting: Family Medicine

## 2022-11-28 ENCOUNTER — Encounter: Payer: Self-pay | Admitting: Family Medicine

## 2022-11-28 VITALS — BP 143/94 | HR 73 | Temp 98.1°F | Resp 16 | Ht 63.0 in | Wt 195.8 lb

## 2022-11-28 DIAGNOSIS — E6609 Other obesity due to excess calories: Secondary | ICD-10-CM

## 2022-11-28 DIAGNOSIS — E039 Hypothyroidism, unspecified: Secondary | ICD-10-CM | POA: Diagnosis not present

## 2022-11-28 DIAGNOSIS — I10 Essential (primary) hypertension: Secondary | ICD-10-CM | POA: Diagnosis not present

## 2022-11-28 DIAGNOSIS — M7731 Calcaneal spur, right foot: Secondary | ICD-10-CM | POA: Diagnosis not present

## 2022-11-28 DIAGNOSIS — Z6834 Body mass index (BMI) 34.0-34.9, adult: Secondary | ICD-10-CM

## 2022-11-28 DIAGNOSIS — M79671 Pain in right foot: Secondary | ICD-10-CM | POA: Diagnosis not present

## 2022-11-28 LAB — COMPREHENSIVE METABOLIC PANEL
ALT: 15 U/L (ref 0–35)
AST: 13 U/L (ref 0–37)
Albumin: 4.4 g/dL (ref 3.5–5.2)
Alkaline Phosphatase: 73 U/L (ref 39–117)
BUN: 20 mg/dL (ref 6–23)
CO2: 30 meq/L (ref 19–32)
Calcium: 9.7 mg/dL (ref 8.4–10.5)
Chloride: 103 meq/L (ref 96–112)
Creatinine, Ser: 0.67 mg/dL (ref 0.40–1.20)
GFR: 99.2 mL/min (ref >60.00)
Glucose, Bld: 81 mg/dL (ref 70–99)
Potassium: 4.7 meq/L (ref 3.5–5.1)
Sodium: 139 meq/L (ref 135–145)
Total Bilirubin: 0.5 mg/dL (ref 0.2–1.2)
Total Protein: 6.9 g/dL (ref 6.0–8.3)

## 2022-11-28 LAB — CBC
HCT: 41.7 % (ref 36.0–46.0)
Hemoglobin: 13.9 g/dL (ref 12.0–15.0)
MCHC: 33.4 g/dL (ref 30.0–36.0)
MCV: 92.3 fl (ref 78.0–100.0)
Platelets: 266 K/uL (ref 150.0–400.0)
RBC: 4.51 Mil/uL (ref 3.87–5.11)
RDW: 13.2 % (ref 11.5–15.5)
WBC: 5.1 K/uL (ref 4.0–10.5)

## 2022-11-28 MED ORDER — LEVOTHYROXINE SODIUM 25 MCG PO TABS
25.0000 ug | ORAL_TABLET | Freq: Every day | ORAL | 3 refills | Status: DC
  Filled 2022-11-28 – 2023-01-31 (×2): qty 90, 90d supply, fill #0
  Filled 2023-02-26 – 2023-07-12 (×2): qty 90, 90d supply, fill #1
  Filled 2023-10-25: qty 90, 90d supply, fill #2

## 2022-11-28 MED ORDER — LOSARTAN POTASSIUM 25 MG PO TABS
25.0000 mg | ORAL_TABLET | Freq: Every day | ORAL | 3 refills | Status: DC
Start: 2022-11-28 — End: 2024-01-03
  Filled 2022-11-28: qty 90, 90d supply, fill #0
  Filled 2023-03-27: qty 90, 90d supply, fill #1
  Filled 2023-07-12: qty 90, 90d supply, fill #2
  Filled 2023-10-25: qty 90, 90d supply, fill #3

## 2022-11-28 MED ORDER — NAPROXEN 500 MG PO TABS
500.0000 mg | ORAL_TABLET | Freq: Two times a day (BID) | ORAL | 0 refills | Status: DC
Start: 2022-11-28 — End: 2022-12-13
  Filled 2022-11-28: qty 14, 7d supply, fill #0

## 2022-11-28 NOTE — Progress Notes (Addendum)
[facility] [address] [place] [address] Dept: [phone] Dept Fax: [phone]  Office Visit  Subjective:    Patient ID: Rhonda Ford, female    DOB: 1970/07/20, 52 y.o..   MRN: [id number]  Chief Complaint  Patient presents with   Foot Pain    Right heel pain, Stabbing and throbbing pain.  Tried nsaids, ice, stretching. No relief    History of Present Illness:  Patient is in today for complaining of a 1-year history of recurrent right foot pain. She describes this as shooting or stabbing sensation. The pain is localized around the heel/sole area. It can occur at rest and is not worsened by activity. This does cause her to limp at times. She notes she had plantar fasciitis in the past. She feels her current pain is different than what she previously had. She has used some intermittent ibuprofen with some mild relief.  Ms. Harlan has a history of hypertension. She is managed on losartan 25 mg daily.   Ms. Calabaza has a history of hypothyroidism. She is managed on levothyroxine 25 mcg daily.  ms. Ruebush notes frustrations with trying to lose weight. She has tried multiple dietary and exercise approaches.  Past Medical History: Patient Active Problem List   Diagnosis Date Noted   Menorrhagia 05/19/2021   History of cervical dysplasia 02/10/2021   Squamous cell carcinoma of multiple sites 02/10/2021   History of pulmonary embolus (PE) 02/10/2021   Hyperlipidemia 02/10/2021   Obstructive sleep apnea 02/10/2021   Class 1 obesity due to excess calories with body mass index (BMI) of 34.0 to 34.9 in adult 02/10/2021   Vitamin B12 deficiency 02/10/2021   Essential hypertension 02/10/2021   History of stomach ulcers 02/10/2021   Hearing impaired 02/09/2021   Hypothyroidism 11/24/2018   Iron deficiency anemia due to chronic blood loss 06/19/2018   Melanoma (HCC) 04/09/2016   Past Surgical History:   Procedure Laterality Date   CESAREAN SECTION     x 4   melanoma surgery      leg   TUBAL LIGATION     Family History  Problem Relation Age of Onset   Hypertension Mother    Heart disease Mother    Stroke Mother    Cancer Father        Skin   Stroke Maternal Aunt    Cancer Maternal Aunt 68       breast cancer and NHL   Cancer Paternal Uncle        Melanoma   Stroke Maternal Grandmother    Heart disease Maternal Grandmother    Heart attack Maternal Grandmother    Cancer Maternal Grandfather        bone cancer   Cancer Paternal Grandmother        colon, melanoma   Colon cancer Paternal Grandmother    Thrombosis Neg Hx    Esophageal cancer Neg Hx    Pancreatic cancer Neg Hx    Stomach cancer Neg Hx    Diabetes Neg Hx    Outpatient Medications Prior to Visit  Medication Sig Dispense Refill   apixaban (ELIQUIS) 5 MG TABS tablet Take 1 tablet (5 mg total) by mouth 2 (two) times daily. 60 tablet 4   atorvastatin (LIPITOR) 20 MG tablet Take 1 tablet (20 mg total) by mouth daily. SCHEDULE OFFICE VISIT FOR FUTURE REFILLS. 15 tablet 0   cyanocobalamin (VITAMIN B12) 1000 MCG/ML injection Inject 1 ml into the muscle once for  1 dose every month 1 mL 11   Ferrous Sulfate (SLOW FE PO) Take 1 tablet by mouth daily.     fluorouracil (EFUDEX) 5 % cream Apply topically to the affected areas 2 times a day for 2 weeks 40 g 3   pantoprazole (PROTONIX) 40 MG tablet pantoprazole 40 mg tablet,delayed release     SYRINGE-NEEDLE, DISP, 3 ML (B-D 3CC LUER-LOK SYR 25GX5/8") 25G X 5/8" 3 ML MISC use as directed with B12 injections 4 each 3   losartan (COZAAR) 25 MG tablet Take 1 tablet (25 mg total) by mouth daily. 90 tablet 1   levothyroxine (SYNTHROID) 25 MCG tablet Take 1 tablet (25 mcg total) by mouth daily before breakfast. 30 tablet 0   pantoprazole (PROTONIX) 20 MG tablet Take 1 tablet (20 mg total) by mouth daily. Needs appointment for further refills. 90 tablet 3   No facility-administered  medications prior to visit.   No Known Allergies   Objective:   Today's Vitals   11/28/22 0928  BP: (!) 143/94  Pulse: 73  Resp: 16  Temp: 98.1 F (36.7 C)  TempSrc: Temporal  SpO2: 97%  Weight: 195 lb 12.8 oz (88.8 kg)  Height: 5\' 3"  (1.6 m)   Body mass index is 34.68 kg/m.   General: Well developed, well nourished. No acute distress. Extremities: Full ROM. No joint swelling or tenderness. No pain on palpation over the heel or arch. No tenderness at the Achilles   tendon. No edema noted. Psych: Alert and oriented. Normal mood and affect.  Health Maintenance Due  Topic Date Due   Zoster Vaccines- Shingrix (1 of 2) Never done   COVID-19 Vaccine (3 - Pfizer risk series) 06/05/2019   INFLUENZA VACCINE  11/08/2022     Assessment & Plan:   Problem List Items Addressed This Visit       Cardiovascular and Mediastinum   Essential hypertension    Blood pressure is in elevated today. I will renew her losartan 25 mg daily. I will recheck her BP in 2 weeks.      Relevant Medications   losartan (COZAAR) 25 MG tablet   Other Relevant Orders   Comprehensive metabolic panel     Endocrine   Hypothyroidism    I will reassess her TSH. I will renew her levothyroxine 25 mcg daily.      Relevant Medications   levothyroxine (SYNTHROID) 25 MCG tablet   Other Relevant Orders   TSH     Other   Class 1 obesity due to excess calories with body mass index (BMI) of 34.0 to 34.9 in adult    Recommend ongoing efforts at diet and exercise. I will [facility] to evaluate her current status and work towards more meaningful, long-term weight loss.      Relevant Orders   Amb Ref to Medical Weight Management   Other Visit Diagnoses     Right foot pain    -  Primary   Etiology is not clear. This sounds more like a neuropathy than plantar fasciitis. I will check labs for causes of neuropathy and an x-ray of the foot.   Relevant Medications   naproxen (NAPROSYN)  500 MG tablet   Other Relevant Orders   Comprehensive metabolic panel   CBC   TSH   VITAMIN D 25 Hydroxy (Vit-D Deficiency, Fractures)   Vitamin B12   Folate   DG Foot Complete Right       Return in about 2  weeks (around 12/12/2022) for Reassessment.   Loyola Mast, MD

## 2022-11-28 NOTE — Assessment & Plan Note (Signed)
I will reassess her TSH. I will renew her levothyroxine 25 mcg daily.

## 2022-11-28 NOTE — Addendum Note (Signed)
Addended by: Loyola Mast on: 11/28/2022 03:22 PM   Modules accepted: Orders

## 2022-11-28 NOTE — Assessment & Plan Note (Signed)
Recommend ongoing efforts at diet and exercise. I will refer her tot [facility] to evaluate her current status and work towards more meaningful, long-term weight loss.

## 2022-11-28 NOTE — Assessment & Plan Note (Signed)
Blood pressure is in elevated today. I will renew her losartan 25 mg daily. I will recheck her BP in 2 weeks.

## 2022-11-29 ENCOUNTER — Other Ambulatory Visit: Payer: Self-pay

## 2022-11-29 MED ORDER — ATORVASTATIN CALCIUM 20 MG PO TABS
20.0000 mg | ORAL_TABLET | Freq: Every day | ORAL | 0 refills | Status: AC
  Filled 2022-11-29: qty 7, 7d supply, fill #0

## 2022-11-30 LAB — VITAMIN D 25 HYDROXY (VIT D DEFICIENCY, FRACTURES): VITD: 34.67 ng/mL (ref 30.00–100.00)

## 2022-11-30 LAB — TSH: TSH: 1.61 u[IU]/mL (ref 0.35–5.50)

## 2022-11-30 LAB — FOLATE: Folate: >24.2 ng/mL (ref >5.9)

## 2022-11-30 LAB — VITAMIN B12: Vitamin B-12: 347 pg/mL (ref 211–911)

## 2022-12-11 ENCOUNTER — Other Ambulatory Visit (HOSPITAL_COMMUNITY): Payer: Self-pay

## 2022-12-13 ENCOUNTER — Ambulatory Visit: Payer: BC Managed Care – PPO | Admitting: Family Medicine

## 2022-12-13 ENCOUNTER — Telehealth: Payer: Self-pay | Admitting: Family Medicine

## 2022-12-13 ENCOUNTER — Encounter: Payer: Self-pay | Admitting: Family Medicine

## 2022-12-13 VITALS — BP 134/80 | HR 74 | Temp 98.2°F | Ht 63.0 in | Wt 196.6 lb

## 2022-12-13 DIAGNOSIS — M79671 Pain in right foot: Secondary | ICD-10-CM | POA: Diagnosis not present

## 2022-12-13 DIAGNOSIS — Z23 Encounter for immunization: Secondary | ICD-10-CM

## 2022-12-13 MED ORDER — NAPROXEN 500 MG PO TABS
500.0000 mg | ORAL_TABLET | Freq: Two times a day (BID) | ORAL | 0 refills | Status: DC
Start: 2022-12-13 — End: 2024-02-12

## 2022-12-13 NOTE — Assessment & Plan Note (Signed)
Improvement, but not resolution of pain with NSAID. I will continue this for now. Lab eval. does not show any underlying cause for neuropathy. Although the x-ray shows heel spurs, her pain pattern has not been consistently with plantar fasciitis. Iw ill refer to podiatry for an assessment.

## 2022-12-13 NOTE — Progress Notes (Signed)
[facility] [address] [place] [address] Dept: [phone] Dept Fax: [phone]  Office Visit  Subjective:    Patient ID: Rhonda Ford, female    DOB: Apr 13, 1970, 52 y.o..   MRN: [id number]  Chief Complaint  Patient presents with   Follow-up    F/u RT foot.  Wants flu shot.      History of Present Illness:  Patient is in today for reassessment of her right foot pain. I had seen her 2 weeks ago with a 1-year history of recurrent right foot pain. She described this as shooting or stabbing sensation, localized around the heel/sole area. It can occur at rest and is not worsened by activity. This does cause her to limp at times. She had a history of plantar fasciitis in the past. She feels her current pain is different than what she previously had. She did have some improvement with naproxen, but this has not fully resolved her issue.   Past Medical History: Patient Active Problem List   Diagnosis Date Noted   Menorrhagia 05/19/2021   History of cervical dysplasia 02/10/2021   Squamous cell carcinoma of multiple sites 02/10/2021   History of pulmonary embolus (PE) 02/10/2021   Hyperlipidemia 02/10/2021   Obstructive sleep apnea 02/10/2021   Class 1 obesity due to excess calories with body mass index (BMI) of 34.0 to 34.9 in adult 02/10/2021   Vitamin B12 deficiency 02/10/2021   Essential hypertension 02/10/2021   History of stomach ulcers 02/10/2021   Hearing impaired 02/09/2021   Hypothyroidism 11/24/2018   Iron deficiency anemia due to chronic blood loss 06/19/2018   Melanoma (HCC) 04/09/2016   Past Surgical History:  Procedure Laterality Date   CESAREAN SECTION     x 4   melanoma surgery      leg   TUBAL LIGATION     Family History  Problem Relation Age of Onset   Hypertension Mother    Heart disease Mother    Stroke Mother    Cancer Father        Skin   Stroke Maternal Aunt    Cancer Maternal  Aunt 27       breast cancer and NHL   Cancer Paternal Uncle        Melanoma   Stroke Maternal Grandmother    Heart disease Maternal Grandmother    Heart attack Maternal Grandmother    Cancer Maternal Grandfather        bone cancer   Cancer Paternal Grandmother        colon, melanoma   Colon cancer Paternal Grandmother    Thrombosis Neg Hx    Esophageal cancer Neg Hx    Pancreatic cancer Neg Hx    Stomach cancer Neg Hx    Diabetes Neg Hx    Outpatient Medications Prior to Visit  Medication Sig Dispense Refill   apixaban (ELIQUIS) 5 MG TABS tablet Take 1 tablet (5 mg total) by mouth 2 (two) times daily. 60 tablet 4   atorvastatin (LIPITOR) 20 MG tablet Take 1 tablet (20 mg total) by mouth daily. SCHEDULE OFFICE VISIT FOR FUTURE REFILLS. 7 tablet 0   cyanocobalamin (VITAMIN B12) 1000 MCG/ML injection Inject 1 ml into the muscle once for 1 dose every month 1 mL 11   Ferrous Sulfate (SLOW FE PO) Take 1 tablet by mouth daily.     fluorouracil (EFUDEX) 5 % cream Apply topically to the affected areas 2 times a day for 2  weeks 40 g 3   levothyroxine (SYNTHROID) 25 MCG tablet Take 1 tablet (25 mcg total) by mouth daily before breakfast. 90 tablet 3   losartan (COZAAR) 25 MG tablet Take 1 tablet (25 mg total) by mouth daily. 90 tablet 3   naproxen (NAPROSYN) 500 MG tablet Take 1 tablet (500 mg total) by mouth 2 (two) times daily with a meal. 14 tablet 0   pantoprazole (PROTONIX) 40 MG tablet pantoprazole 40 mg tablet,delayed release     SYRINGE-NEEDLE, DISP, 3 ML (B-D 3CC LUER-LOK SYR 25GX5/8") 25G X 5/8" 3 ML MISC use as directed with B12 injections 4 each 3   No facility-administered medications prior to visit.   No Known Allergies   Objective:   Today's Vitals   12/13/22 1031  BP: 134/80  Pulse: 74  Temp: 98.2 F (36.8 C)  TempSrc: Temporal  SpO2: 98%  Weight: 196 lb 9.6 oz (89.2 kg)  Height: 5\' 3"  (1.6 m)   Body mass index is 34.83 kg/m.   General: Well developed, well  nourished. No acute distress. Psych: Alert and oriented. Normal mood and affect.  Health Maintenance Due  Topic Date Due   Zoster Vaccines- Shingrix (1 of 2) Never done   INFLUENZA VACCINE  11/08/2022   Lab Results    Latest Ref Rng & Units 11/28/2022   10:10 AM 11/01/2022   11:39 AM 05/03/2022   12:10 PM  CBC  WBC 4.0 - 10.5 K/uL 5.1  5.8  4.7   Hemoglobin 12.0 - 15.0 g/dL 16.1  09.6  04.5   Hematocrit 36.0 - 46.0 % 41.7  42.6  35.2   Platelets 150.0 - 400.0 K/uL 266.0  288  224        Latest Ref Rng & Units 11/28/2022   10:10 AM 10/19/2020    9:20 AM 08/17/2020   11:22 AM  CMP  Glucose 70 - 99 mg/dL 81  409  95   BUN 6 - 23 mg/dL 20  22  15    Creatinine 0.40 - 1.20 mg/dL 8.11  9.14  7.82   Sodium 135 - 145 mEq/L 139  139  140   Potassium 3.5 - 5.1 mEq/L 4.7  4.2  3.8   Chloride 96 - 112 mEq/L 103  103  105   CO2 19 - 32 mEq/L 30  22  26    Calcium 8.4 - 10.5 mg/dL 9.7  9.2  9.3   Total Protein 6.0 - 8.3 g/dL 6.9     Total Bilirubin 0.2 - 1.2 mg/dL 0.5     Alkaline Phos 39 - 117 U/L 73     AST 0 - 37 U/L 13     ALT 0 - 35 U/L 15      Last thyroid functions Lab Results  Component Value Date   TSH 1.61 11/28/2022   T3TOTAL 103 10/19/2020   Last vitamin D Lab Results  Component Value Date   VD25OH 34.67 11/28/2022   Last vitamin B12 and Folate Lab Results  Component Value Date   VITAMINB12 347 11/28/2022   FOLATE >24.2 11/28/2022   Imaging: Right Foot X-ray (11/28/2022) IMPRESSION: Calcaneal spurs.  No acute osseous abnormalities.    Assessment & Plan:   Problem List Items Addressed This Visit       Other   Right foot pain - Primary    Improvement, but not resolution of pain with NSAID. I will continue this for now. Lab eval. does not show any underlying cause for  neuropathy. Although the x-ray shows heel spurs, her pain pattern has not been consistently with plantar fasciitis. Iw ill refer to podiatry for an assessment.      Relevant Medications    naproxen (NAPROSYN) 500 MG tablet   Other Relevant Orders   Ambulatory referral to Podiatry   Other Visit Diagnoses     Need for immunization against influenza       Relevant Orders   Flu vaccine trivalent PF, 6mos and older(Flulaval,Afluria,Fluarix,Fluzone) (Completed)      Return if symptoms worsen or fail to improve.   Loyola Mast, MD

## 2022-12-18 ENCOUNTER — Ambulatory Visit: Payer: BC Managed Care – PPO | Admitting: Podiatry

## 2022-12-18 DIAGNOSIS — M722 Plantar fascial fibromatosis: Secondary | ICD-10-CM | POA: Diagnosis not present

## 2022-12-18 DIAGNOSIS — M79673 Pain in unspecified foot: Secondary | ICD-10-CM

## 2022-12-18 NOTE — Telephone Encounter (Signed)
error 

## 2022-12-18 NOTE — Patient Instructions (Signed)

## 2022-12-18 NOTE — Progress Notes (Signed)
  Subjective:  Patient ID: Rhonda Ford, female    DOB: 05-15-1970,  MRN: [id number]  Chief Complaint  Patient presents with   Foot Pain    C/o stabbing pain to the right heel radiating to lateral and medial aspect of foot and achilles at times. Pain started about a year ago. She is not diabetic. PCP referred her to get xray's. She is taking Advil and Naproxen to help with the pain. She has tried to stretch the area and changes shoes.     52 y.o. female presents with the above complaint.  With concern for pain in the right heel.  Feels like a stabbing pain that is worse if she is sitting or gets out of bed.  She has taken naproxen which helps somewhat.  Also taking liquid Advil capsule which helps.  She has tried stretching.   Review of Systems: Negative except as noted in the HPI. Denies N/V/F/Ch.   Objective:  There were no vitals filed for this visit. There is no height or weight on file to calculate BMI. Constitutional Well developed. Well nourished.  Vascular Dorsalis pedis pulses palpable bilaterally. Posterior tibial pulses palpable bilaterally. Capillary refill normal to all digits.  No cyanosis or clubbing noted. Pedal hair growth normal.  Neurologic Normal speech. Oriented to person, place, and time. Epicritic sensation to light touch grossly present bilaterally.  Dermatologic Nails well groomed and normal in appearance. No open wounds. No skin lesions.  Orthopedic: Normal joint ROM without pain or crepitus bilaterally. No visible deformities. Tender to palpation at the calcaneal tuber right. No pain with calcaneal squeeze right. Ankle ROM diminished range of motion right. Silfverskiold Test: positive right.   Radiographs: reviewed. No acute fractures or dislocations. No evidence of stress fracture.  Plantar heel spur present. Posterior heel spur present.   Assessment:   1. Plantar fasciitis, right    Plan:  Patient was evaluated and treated and all  questions answered.  Plantar Fasciitis, right - XR reviewed as above.  - Educated on icing and stretching. Instructions given.  - Injection delivered to the plantar fascia as below. - DME: Night splint dispensed.  PowerStep dispensed - Pharmacologic management: Advil as needed  Procedure: Injection Tendon/Ligament Location: Right plantar fascia at the glabrous junction; medial approach. Skin Prep: alcohol Injectate: 1 cc 0.5% marcaine plain, 1 cc kenalog 10. Disposition: Patient tolerated procedure well. Injection site dressed with a band-aid.  Return in about 4 weeks (around 01/15/2023) for f/u R PF.

## 2023-01-07 ENCOUNTER — Encounter: Payer: Self-pay | Admitting: Family Medicine

## 2023-01-12 IMAGING — CT CT HEART MORP W/ CTA COR W/ SCORE W/ CA W/CM &/OR W/O CM
1 series · 13 of 20 positions shown, 17 images · non-contrast
Comparison: 07/03/2018
COMPARISON: 07/03/2018

Addendum:
EXAM:
OVER-READ INTERPRETATION  CT CHEST

The following report is an over-read performed by radiologist Dr.
Roberto Tiger [[email or web address]] on 11/02/2020. This over-read
does not include interpretation of cardiac or coronary anatomy or
pathology. The coronary CTA interpretation by the cardiologist is
attached.
CLINICAL DATA: Chest pain
Cardiac/Coronary CTA
TECHNIQUE: A non-contrast, gated CT scan was obtained with axial slices of 3 mm
through the heart for calcium scoring. Calcium scoring was performed
using the Agatston method. A 120 kV prospective, gated, contrast
cardiac scan was obtained. Gantry rotation speed was 250 msecs and
collimation was 0.6 mm. Two sublingual nitroglycerin tablets (0.8
mg) were given. A high-pitch scan was obtained during late diastole
and images were reconstructed at 74%. The 3D dataset was analyzed on
a dedicated workstation using MPR, MIP, and VRT modes. The patient
received 95 cc of contrast.

[Series 929: findings · 13 of 22 slices shown, 17 images]
[im 2/22  vessel]
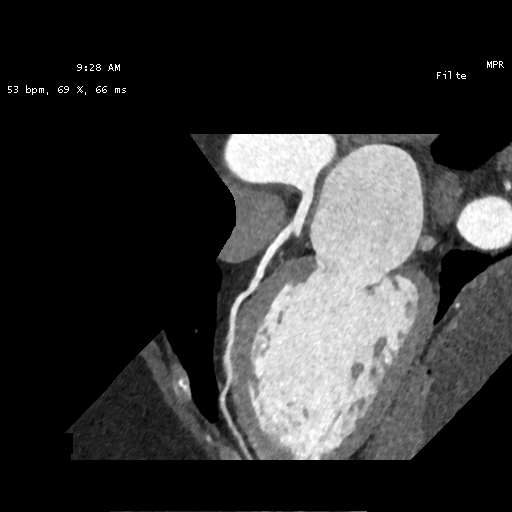
[im 2/22  lung]
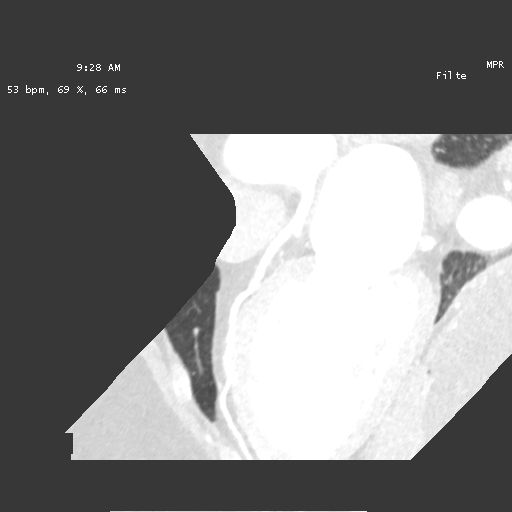
[im 4/22  vessel]
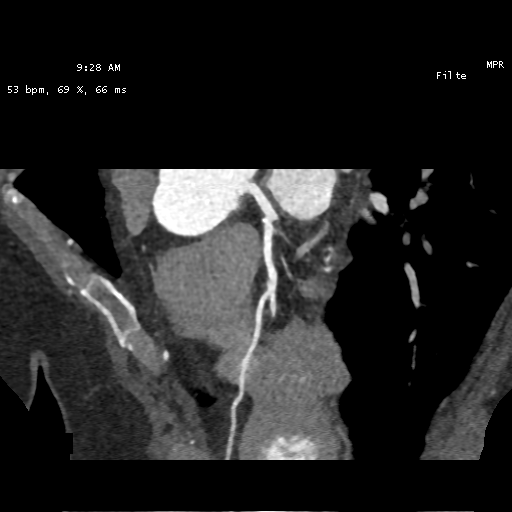
[im 5/22  vessel]
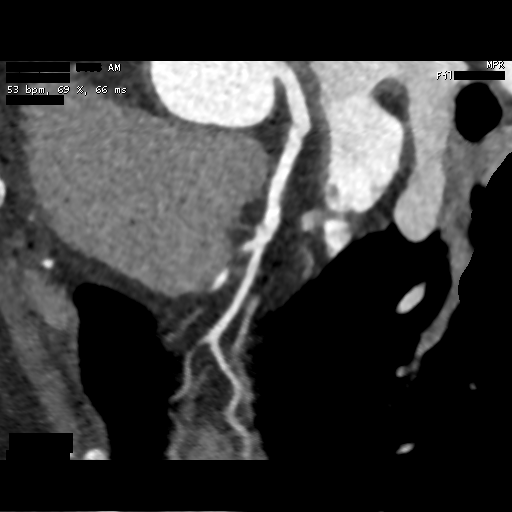
[im 6/22  vessel]
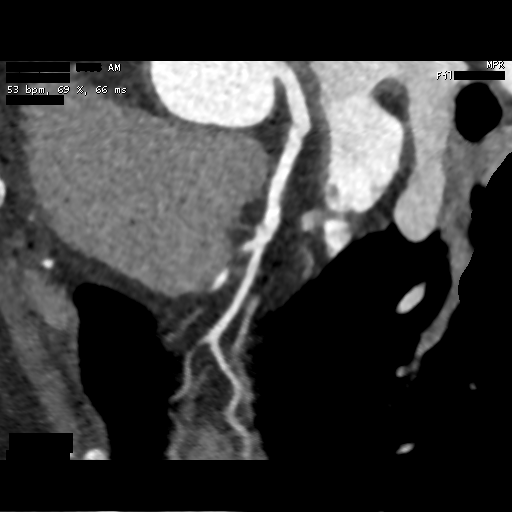
[im 8/22  vessel]
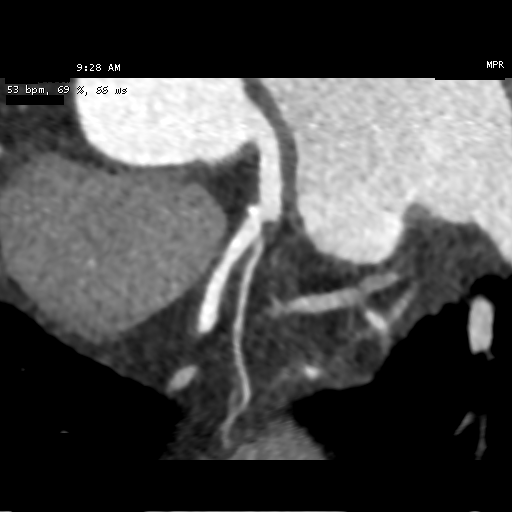
[im 8/22  lung]
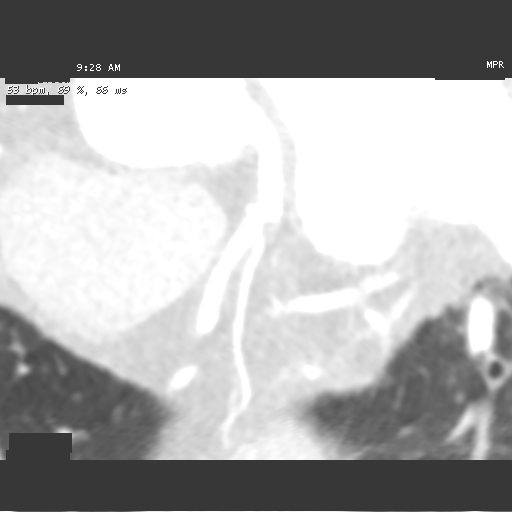
[im 9/22  vessel]
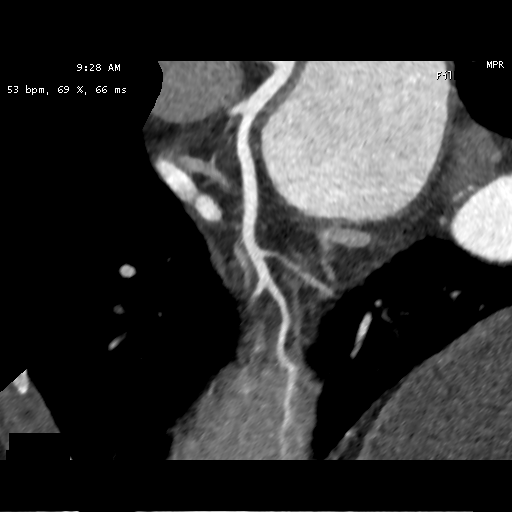
[im 12/22  vessel]
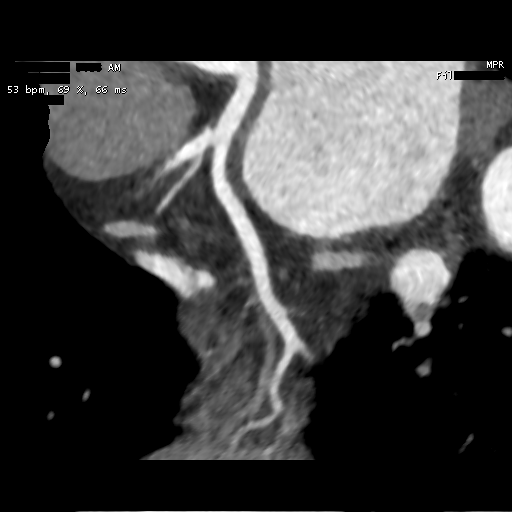
[im 13/22  vessel]
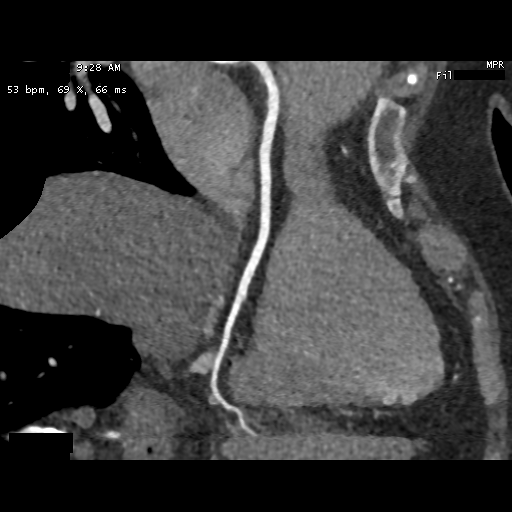
[im 14/22  vessel]
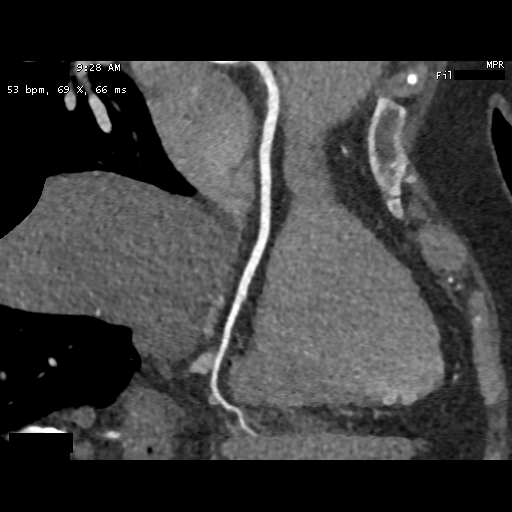
[im 14/22  lung]
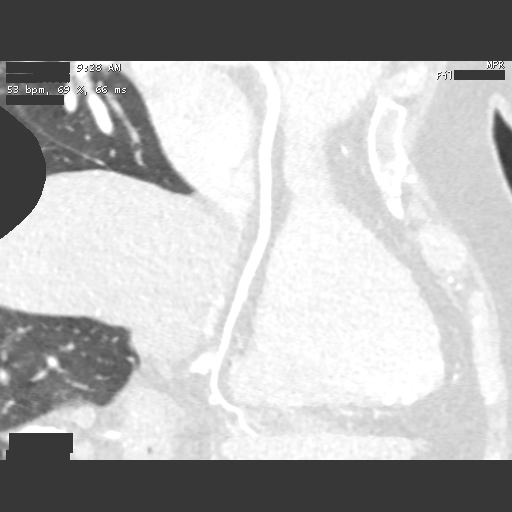
[im 16/22  vessel]
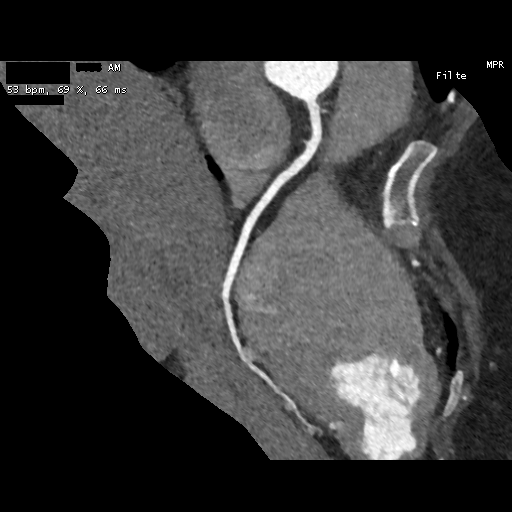
[im 17/22  vessel]
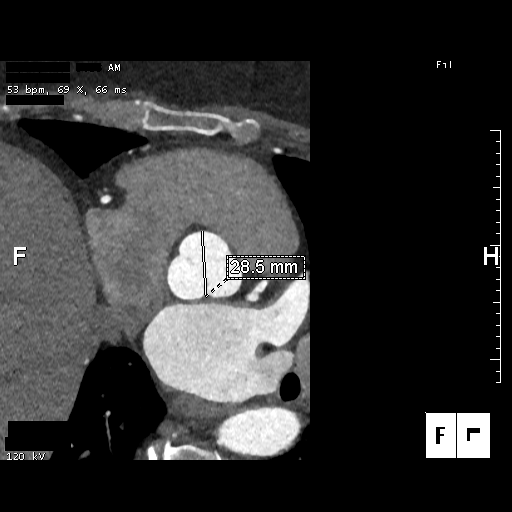
[im 18/22  vessel]
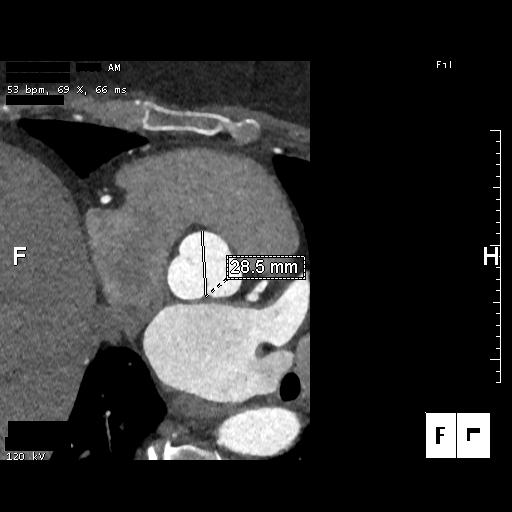
[im 20/22  vessel]
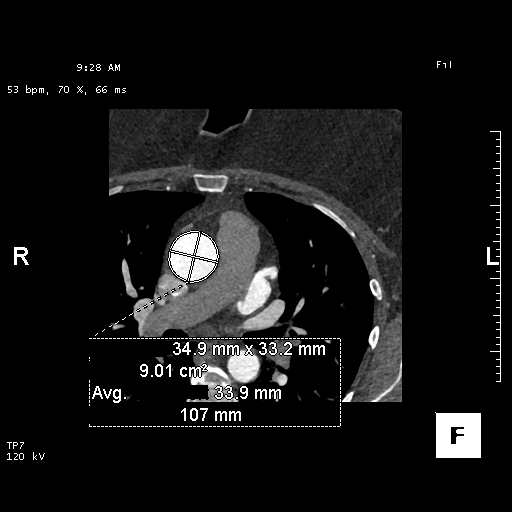
[im 20/22  lung]
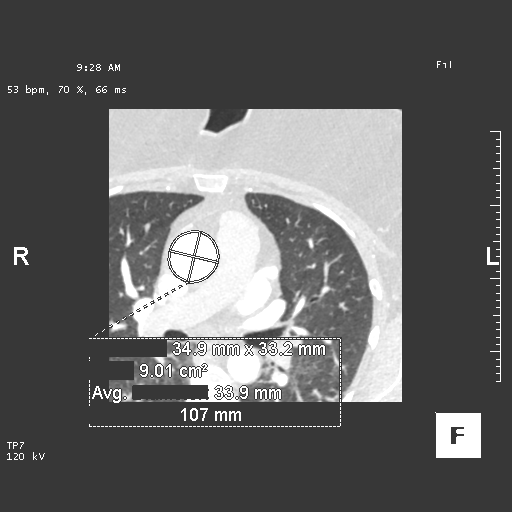

[13 of 20 positions shown; findings below may reference images not displayed]

FINDINGS: Vascular: Heart is normal size.  Aorta normal caliber.

Mediastinum/Nodes: No adenopathy

Lungs/Pleura: No confluent opacity or effusion.

Upper Abdomen: Imaging into the upper abdomen demonstrates no acute
findings.

Musculoskeletal: Chest wall soft tissues are unremarkable. No acute
bony abnormality.
IMPRESSION: No acute or significant extracardiac abnormality.
FINDINGS: Image quality: Excellent.

Noise artifact is: Limited.

Coronary Arteries:  Normal coronary origin.  Right dominance.

Left main: The left main is a large caliber vessel with a normal
take off from the left coronary cusp that bifurcates to form a left
anterior descending artery and a left circumflex artery. There is no
plaque or stenosis.

Left anterior descending artery: The proximal LAD contains minimal
calcified plaque (<25%). The mid LAD contains a myocardial bridge
(normal variant). The mid and distal segments are patent. The LAD
gives off 2 patent diagonal branches.

Left circumflex artery: The LCX is non-dominant and patent with no
evidence of plaque or stenosis. The LCX gives off 1 patent obtuse
marginal branch.

Right coronary artery: The RCA is dominant with normal take off from
the right coronary cusp. There is no evidence of plaque or stenosis.
The RCA terminates as a PDA and right posterolateral branch without
evidence of plaque or stenosis.

Right Atrium: Right atrial size is within normal limits.

Right Ventricle: The right ventricular cavity is within normal
limits.

Left Atrium: Left atrial size is normal in size with no left atrial
appendage filling defect.

Left Ventricle: The ventricular cavity size is within normal limits.
There are no stigmata of prior infarction. There is no abnormal
filling defect.

Pulmonary arteries: Normal in size without proximal filling defect.

Pulmonary veins: Normal pulmonary venous drainage.

Pericardium: Normal thickness with no significant effusion or
calcium present.

Cardiac valves: The aortic valve is trileaflet without significant
calcification. The mitral valve is normal structure without
significant calcification.

Aorta: Normal caliber with no significant disease.

Extra-cardiac findings: See attached radiology report for
non-cardiac structures.
IMPRESSION: 1. Coronary calcium score of 17.4. This was 90th percentile for
age-, sex, and race-matched controls.

2. Normal coronary origin with right dominance.

3. Minimal calcified plaque (<25%) in the proximal LAD.

4. Mid LAD myocardial bridge (normal variant).

RECOMMENDATIONS:
1. Minimal non-obstructive CAD (0-24%). Consider non-atherosclerotic
causes of chest pain. Consider preventive therapy and risk factor
modification.

*** End of Addendum ***
EXAM:
OVER-READ INTERPRETATION  CT CHEST

The following report is an over-read performed by radiologist Dr.
Roberto Tiger [[email or web address]] on 11/02/2020. This over-read
does not include interpretation of cardiac or coronary anatomy or
pathology. The coronary CTA interpretation by the cardiologist is
attached.
FINDINGS: Vascular: Heart is normal size.  Aorta normal caliber.

Mediastinum/Nodes: No adenopathy

Lungs/Pleura: No confluent opacity or effusion.

Upper Abdomen: Imaging into the upper abdomen demonstrates no acute
findings.

Musculoskeletal: Chest wall soft tissues are unremarkable. No acute
bony abnormality.
IMPRESSION: No acute or significant extracardiac abnormality.

## 2023-01-15 ENCOUNTER — Encounter: Payer: Self-pay | Admitting: Podiatry

## 2023-01-15 ENCOUNTER — Ambulatory Visit: Payer: BC Managed Care – PPO | Admitting: Podiatry

## 2023-01-15 DIAGNOSIS — M722 Plantar fascial fibromatosis: Secondary | ICD-10-CM

## 2023-01-15 NOTE — Progress Notes (Signed)
Subjective:  Patient ID: Rhonda Ford, female    DOB: 12-11-1970,  MRN: [id number]  Chief Complaint  Patient presents with   Plantar Fasciitis    F/up right heel. Does have 90% improvement to right side. Some residual soreness, patient reports this is actually bilateral.    52 y.o. female presents for follow-up of plantar fasciitis right heel.  Does have 90% improvement after steroid injection last visit.  She notices pain on the left lower extremity in the heel at this point in time which she thinks she did not notice last time due to the severe pain in the right.  She says now both heels are equal.  She is doing some stretching wearing a night splint on the right side and is overall much improved with less pain and is not limping at this time.   Review of Systems: Negative except as noted in the HPI. Denies N/V/F/Ch.   Objective:  There were no vitals filed for this visit. There is no height or weight on file to calculate BMI. Constitutional Well developed. Well nourished.  Vascular Dorsalis pedis pulses palpable bilaterally. Posterior tibial pulses palpable bilaterally. Capillary refill normal to all digits.  No cyanosis or clubbing noted. Pedal hair growth normal.  Neurologic Normal speech. Oriented to person, place, and time. Epicritic sensation to light touch grossly present bilaterally.  Dermatologic Nails well groomed and normal in appearance. No open wounds. No skin lesions.  Orthopedic: Normal joint ROM without pain or crepitus bilaterally. No visible deformities. Tender to palpation at the calcaneal tuber left and right very minimally much improved from prior No pain with calcaneal squeeze right. Ankle ROM diminished range of motion right. Silfverskiold Test: positive right.   Radiographs: reviewed. No acute fractures or dislocations. No evidence of stress fracture.  Plantar heel spur present. Posterior heel spur present.   Assessment:   1. Plantar  fasciitis, bilateral     Plan:  Patient was evaluated and treated and all questions answered.  Plantar Fasciitis, right -much improved after prior steroid injection -Still having some lingering pain however in both heels -Continue conservative therapies as below - XR reviewed as above.  - Educated on icing and stretching. Instructions given.  - Injection deferred this time - DME: Night splint dispensed for left lower extremity patient previously had 1 for the right - Pharmacologic management: Advil as needed  Return in about 6 weeks (around 02/26/2023) for f/u bilateral PF.

## 2023-01-15 NOTE — Patient Instructions (Signed)

## 2023-01-30 ENCOUNTER — Other Ambulatory Visit: Payer: Self-pay

## 2023-01-30 DIAGNOSIS — E538 Deficiency of other specified B group vitamins: Secondary | ICD-10-CM

## 2023-01-30 DIAGNOSIS — D500 Iron deficiency anemia secondary to blood loss (chronic): Secondary | ICD-10-CM

## 2023-01-31 ENCOUNTER — Inpatient Hospital Stay: Payer: BC Managed Care – PPO | Attending: Physician Assistant

## 2023-01-31 ENCOUNTER — Other Ambulatory Visit: Payer: Self-pay | Admitting: Cardiovascular Disease

## 2023-01-31 ENCOUNTER — Inpatient Hospital Stay: Payer: BC Managed Care – PPO

## 2023-01-31 ENCOUNTER — Other Ambulatory Visit (HOSPITAL_COMMUNITY): Payer: Self-pay

## 2023-01-31 ENCOUNTER — Other Ambulatory Visit: Payer: Self-pay

## 2023-01-31 ENCOUNTER — Other Ambulatory Visit: Payer: Self-pay | Admitting: Hematology

## 2023-01-31 DIAGNOSIS — D500 Iron deficiency anemia secondary to blood loss (chronic): Secondary | ICD-10-CM | POA: Diagnosis not present

## 2023-01-31 DIAGNOSIS — E538 Deficiency of other specified B group vitamins: Secondary | ICD-10-CM

## 2023-01-31 LAB — CBC WITH DIFFERENTIAL (CANCER CENTER ONLY)
Abs Immature Granulocytes: 0.01 10*3/uL (ref 0.00–0.07)
Basophils Absolute: 0 10*3/uL (ref 0.0–0.1)
Basophils Relative: 1 %
Eosinophils Absolute: 0.1 10*3/uL (ref 0.0–0.5)
Eosinophils Relative: 2 %
HCT: 40.1 % (ref 36.0–46.0)
Hemoglobin: 13.4 g/dL (ref 12.0–15.0)
Immature Granulocytes: 0 %
Lymphocytes Relative: 23 %
Lymphs Abs: 1.4 10*3/uL (ref 0.7–4.0)
MCH: 30.4 pg (ref 26.0–34.0)
MCHC: 33.4 g/dL (ref 30.0–36.0)
MCV: 90.9 fL (ref 80.0–100.0)
Monocytes Absolute: 0.5 10*3/uL (ref 0.1–1.0)
Monocytes Relative: 8 %
Neutro Abs: 3.9 10*3/uL (ref 1.7–7.7)
Neutrophils Relative %: 66 %
Platelet Count: 264 10*3/uL (ref 150–400)
RBC: 4.41 MIL/uL (ref 3.87–5.11)
RDW: 12.6 % (ref 11.5–15.5)
WBC Count: 5.8 10*3/uL (ref 4.0–10.5)
nRBC: 0 % (ref 0.0–0.2)

## 2023-01-31 LAB — IRON AND IRON BINDING CAPACITY (CC-WL,HP ONLY)
Iron: 94 ug/dL (ref 28–170)
Saturation Ratios: 31 % (ref 10.4–31.8)
TIBC: 302 ug/dL (ref 250–450)
UIBC: 208 ug/dL

## 2023-01-31 LAB — FERRITIN: Ferritin: 57 ng/mL (ref 11–307)

## 2023-01-31 MED ORDER — APIXABAN 5 MG PO TABS
5.0000 mg | ORAL_TABLET | Freq: Two times a day (BID) | ORAL | 4 refills | Status: DC
  Filled 2023-01-31: qty 60, 30d supply, fill #0
  Filled 2023-03-27: qty 60, 30d supply, fill #1
  Filled 2023-07-12: qty 60, 30d supply, fill #2
  Filled 2023-10-25 – 2023-10-29 (×2): qty 60, 30d supply, fill #3
  Filled 2023-12-11: qty 60, 30d supply, fill #4

## 2023-02-05 ENCOUNTER — Encounter: Payer: Self-pay | Admitting: Hematology

## 2023-02-26 ENCOUNTER — Other Ambulatory Visit (HOSPITAL_COMMUNITY): Payer: Self-pay

## 2023-02-26 ENCOUNTER — Ambulatory Visit: Payer: BC Managed Care – PPO | Admitting: Podiatry

## 2023-02-26 DIAGNOSIS — M7751 Other enthesopathy of right foot: Secondary | ICD-10-CM | POA: Diagnosis not present

## 2023-02-26 DIAGNOSIS — M7752 Other enthesopathy of left foot: Secondary | ICD-10-CM

## 2023-02-26 NOTE — Progress Notes (Signed)
  Subjective:  Patient ID: Rhonda Ford, female    DOB: [birth year],  MRN: [id number]  Chief Complaint  Patient presents with   Foot Pain    Follow up from foot pain. Not as bad today. While out of town yesterday did have an increased amount of pain.     52 y.o. female presents for follow-up of plantar heel pain. Still with pain plantar aspect of both heels.    Review of Systems: Negative except as noted in the HPI. Denies N/V/F/Ch.   Objective:  There were no vitals filed for this visit. There is no height or weight on file to calculate BMI. Constitutional Well developed. Well nourished.  Vascular Dorsalis pedis pulses palpable bilaterally. Posterior tibial pulses palpable bilaterally. Capillary refill normal to all digits.  No cyanosis or clubbing noted. Pedal hair growth normal.  Neurologic Normal speech. Oriented to person, place, and time. Epicritic sensation to light touch grossly present bilaterally.  Dermatologic Nails well groomed and normal in appearance. No open wounds. No skin lesions.  Orthopedic: Normal joint ROM without pain or crepitus bilaterally. No visible deformities. Tender to palpation at the calcaneal tuber left and right very minimally much improved from prior No pain with calcaneal squeeze right. Ankle ROM diminished range of motion right. Silfverskiold Test: positive right.   Radiographs: reviewed. No acute fractures or dislocations. No evidence of stress fracture.  Plantar heel spur present. Posterior heel spur present.   Assessment:   1. Calcaneal bursitis (heel), right   2. Calcaneal bursitis (heel), left      Plan:  Patient was evaluated and treated and all questions answered.  Plantar Heel bursitis bilateral -Flare in pain, likely more bursitis than fasciitis as this time - Educated on icing and stretching. Continue - Injection delivered to the plantar calcaneal bursae bilateral - DME: Recommend gel heel cups - Pharmacologic  management: Ibuprofen as needed for pain  Procedure: Injection Plantar calc bursae bilateral Location: Bilateral plantar calc bursae at the glabrous junction; medial approach. Skin Prep: alcohol Injectate: 1 cc 0.5% marcaine plain, 1 cc kenalog 10. Disposition: Patient tolerated procedure well. Injection site dressed with a band-aid.  Return if symptoms worsen or fail to improve.

## 2023-03-27 ENCOUNTER — Other Ambulatory Visit (HOSPITAL_COMMUNITY): Payer: Self-pay

## 2023-03-27 ENCOUNTER — Other Ambulatory Visit: Payer: Self-pay | Admitting: Cardiovascular Disease

## 2023-03-27 ENCOUNTER — Other Ambulatory Visit: Payer: Self-pay

## 2023-05-05 NOTE — Assessment & Plan Note (Deleted)
 Likely secondary to menorrhagia.  -per pt, h/o anemia for many years, with hgb 7.5-10, no work up done until after PE in late 2019. -Workup done by Dr. Melton Alar in 06/2018 is consistent with iron deficiency anemia.  -colonoscopy and EGD in 01/2018 were normal except stomach ulcer, no H. Pylori. -she started IV iron as needed in 06/2018

## 2023-05-05 NOTE — Assessment & Plan Note (Deleted)
 -  Occurred in 01/2018. No PE seen on 07/03/18 Angio CT chest. Started Eliquis 06/2018. Given this was significant, unprovoked PE and no family history of PE, it has been recommended to continue indefinitely.  -Continue Eliquis, okay to hold 2 to 3 days during menstrual period due to menorrhagia.

## 2023-05-05 NOTE — Assessment & Plan Note (Deleted)
 Unclear etiology of B12 deficiency, her intrinsic factor was negative -She will continue monthly B12 injection at home.

## 2023-05-06 ENCOUNTER — Inpatient Hospital Stay: Payer: BC Managed Care – PPO

## 2023-05-06 ENCOUNTER — Inpatient Hospital Stay: Payer: BC Managed Care – PPO | Attending: Physician Assistant | Admitting: Hematology

## 2023-05-06 DIAGNOSIS — D500 Iron deficiency anemia secondary to blood loss (chronic): Secondary | ICD-10-CM

## 2023-05-06 DIAGNOSIS — E538 Deficiency of other specified B group vitamins: Secondary | ICD-10-CM

## 2023-05-06 DIAGNOSIS — Z86711 Personal history of pulmonary embolism: Secondary | ICD-10-CM

## 2023-06-18 ENCOUNTER — Other Ambulatory Visit (HOSPITAL_COMMUNITY): Payer: Self-pay

## 2023-07-12 ENCOUNTER — Other Ambulatory Visit: Payer: Self-pay | Admitting: Cardiovascular Disease

## 2023-07-12 ENCOUNTER — Other Ambulatory Visit (HOSPITAL_COMMUNITY): Payer: Self-pay

## 2023-07-12 ENCOUNTER — Other Ambulatory Visit: Payer: Self-pay | Admitting: Family Medicine

## 2023-07-12 MED ORDER — PANTOPRAZOLE SODIUM 40 MG PO TBEC
40.0000 mg | DELAYED_RELEASE_TABLET | Freq: Every day | ORAL | 3 refills | Status: AC
  Filled 2023-07-12: qty 90, 90d supply, fill #0
  Filled 2023-10-25: qty 90, 90d supply, fill #1
  Filled 2024-01-03 – 2024-01-07 (×2): qty 90, 90d supply, fill #2

## 2023-07-12 NOTE — Telephone Encounter (Signed)
 LR   hx provider  LOV  05/10/21 FOV   none scheduled.   Please review and advise.  Thanks. Dm/cma

## 2023-07-12 NOTE — Telephone Encounter (Signed)
 Dr. Marylene Buerger pt. She hasn't been seen in a while and is passed her 3rd attempt. Does Dr. Flora Lipps want to refill? Please advise.

## 2023-07-13 ENCOUNTER — Other Ambulatory Visit (HOSPITAL_COMMUNITY): Payer: Self-pay

## 2023-10-25 ENCOUNTER — Other Ambulatory Visit: Payer: Self-pay | Admitting: Cardiovascular Disease

## 2023-10-25 ENCOUNTER — Other Ambulatory Visit: Payer: Self-pay

## 2023-10-25 ENCOUNTER — Encounter: Payer: Self-pay | Admitting: Pharmacist

## 2023-10-25 ENCOUNTER — Encounter: Payer: Self-pay | Admitting: Hematology

## 2023-10-25 ENCOUNTER — Other Ambulatory Visit (HOSPITAL_COMMUNITY): Payer: Self-pay

## 2023-10-26 ENCOUNTER — Other Ambulatory Visit (HOSPITAL_COMMUNITY): Payer: Self-pay

## 2023-10-28 ENCOUNTER — Other Ambulatory Visit: Payer: Self-pay

## 2023-10-29 ENCOUNTER — Encounter: Payer: Self-pay | Admitting: Pharmacist

## 2023-10-29 ENCOUNTER — Other Ambulatory Visit: Payer: Self-pay

## 2023-10-29 ENCOUNTER — Other Ambulatory Visit (HOSPITAL_COMMUNITY): Payer: Self-pay

## 2023-10-29 ENCOUNTER — Encounter: Payer: Self-pay | Admitting: Hematology

## 2023-10-30 ENCOUNTER — Other Ambulatory Visit: Payer: Self-pay

## 2023-12-11 ENCOUNTER — Other Ambulatory Visit: Payer: Self-pay

## 2024-01-03 ENCOUNTER — Other Ambulatory Visit (HOSPITAL_COMMUNITY): Payer: Self-pay

## 2024-01-03 ENCOUNTER — Other Ambulatory Visit: Payer: Self-pay | Admitting: Hematology

## 2024-01-03 ENCOUNTER — Other Ambulatory Visit: Payer: Self-pay | Admitting: Family Medicine

## 2024-01-03 ENCOUNTER — Other Ambulatory Visit: Payer: Self-pay | Admitting: Cardiovascular Disease

## 2024-01-03 DIAGNOSIS — E039 Hypothyroidism, unspecified: Secondary | ICD-10-CM

## 2024-01-03 DIAGNOSIS — I10 Essential (primary) hypertension: Secondary | ICD-10-CM

## 2024-01-06 ENCOUNTER — Other Ambulatory Visit (HOSPITAL_COMMUNITY): Payer: Self-pay

## 2024-01-06 ENCOUNTER — Other Ambulatory Visit: Payer: Self-pay

## 2024-01-06 MED ORDER — LOSARTAN POTASSIUM 25 MG PO TABS
25.0000 mg | ORAL_TABLET | Freq: Every day | ORAL | 3 refills | Status: AC
  Filled 2024-01-06: qty 90, 90d supply, fill #0
  Filled 2024-03-17: qty 90, 90d supply, fill #1

## 2024-01-06 MED ORDER — APIXABAN 5 MG PO TABS
5.0000 mg | ORAL_TABLET | Freq: Two times a day (BID) | ORAL | 4 refills | Status: AC
  Filled 2024-01-06: qty 60, 30d supply, fill #0
  Filled 2024-03-17: qty 60, 30d supply, fill #1

## 2024-01-06 MED ORDER — LEVOTHYROXINE SODIUM 25 MCG PO TABS
25.0000 ug | ORAL_TABLET | Freq: Every day | ORAL | 3 refills | Status: AC
  Filled 2024-01-06: qty 90, 90d supply, fill #0

## 2024-01-07 ENCOUNTER — Other Ambulatory Visit (HOSPITAL_COMMUNITY): Payer: Self-pay

## 2024-01-07 ENCOUNTER — Encounter (HOSPITAL_COMMUNITY): Payer: Self-pay

## 2024-01-07 ENCOUNTER — Telehealth: Payer: Self-pay | Admitting: Nurse Practitioner

## 2024-01-07 NOTE — Telephone Encounter (Signed)
 I LVM with Delon informing her that she needs a follow up appointment scheduled per Lacie from a staff message received on 9/29. I asked Leeba to return my call if she needs to re-schedule her scheduled appointment on 10/14.

## 2024-01-08 ENCOUNTER — Encounter: Payer: Self-pay | Admitting: Pharmacist

## 2024-01-08 ENCOUNTER — Other Ambulatory Visit: Payer: Self-pay

## 2024-01-08 ENCOUNTER — Other Ambulatory Visit (HOSPITAL_COMMUNITY): Payer: Self-pay

## 2024-01-10 ENCOUNTER — Other Ambulatory Visit (HOSPITAL_COMMUNITY): Payer: Self-pay

## 2024-01-13 ENCOUNTER — Other Ambulatory Visit: Payer: Self-pay

## 2024-01-20 ENCOUNTER — Other Ambulatory Visit: Payer: Self-pay | Admitting: Nurse Practitioner

## 2024-01-20 DIAGNOSIS — E538 Deficiency of other specified B group vitamins: Secondary | ICD-10-CM

## 2024-01-20 NOTE — Progress Notes (Unsigned)
 [facility]   [phone] [phone]    Patient Care Team: Thedora Garnette HERO, MD as PCP - General (Family Medicine) Junior Emmit Ivanoff, MD as Referring Physician (Dermatology) O'Neal, Darryle Ned, MD as Consulting Physician (Cardiology) Austin Ned, MD as Consulting Physician (Obstetrics and Gynecology) Aneita Gwendlyn DASEN, MD (Inactive) as Consulting Physician (Gastroenterology) Burnard Ned LABOR, MD (Inactive) as Consulting Physician (Sleep Medicine) Lanny Callander, MD as Consulting Physician (Hematology)   CHIEF COMPLAINT: Follow up IDA and h/o PE  CURRENT THERAPY: Oral iron , IV iron  if ferritin <30, oral B12, indefinite anticoagulation for PE  INTERVAL HISTORY  Rhonda Ford returns for follow up as scheduled. Last seen by Dr. Lanny 05/03/22. Last IV iron  (venofer ) 05/19/22 and 06/01/22.   ROS   Past Medical History:  Diagnosis Date   B12 deficiency    Iron  deficiency anemia due to chronic blood loss    Melanoma (HCC)    Migraines    Pulmonary emboli (HCC) 01/2018   Skin cancer    Sleep apnea      Past Surgical History:  Procedure Laterality Date   CESAREAN SECTION     x 4   melanoma surgery      leg   TUBAL LIGATION       Outpatient Encounter Medications as of 01/21/2024  Medication Sig   apixaban  (ELIQUIS ) 5 MG TABS tablet Take 1 tablet (5 mg total) by mouth 2 (two) times daily.   atorvastatin  (LIPITOR) 20 MG tablet Take 1 tablet (20 mg total) by mouth daily. SCHEDULE OFFICE VISIT FOR FUTURE REFILLS.   cyanocobalamin  (VITAMIN B12) 1000 MCG/ML injection Inject 1 ml into the muscle once for 1 dose every month   Ferrous Sulfate (SLOW FE PO) Take 1 tablet by mouth daily.   fluorouracil  (EFUDEX ) 5 % cream Apply topically to the affected areas 2 times a day for 2 weeks   levothyroxine  (SYNTHROID ) 25 MCG tablet Take 1 tablet (25 mcg total) by mouth daily before breakfast.   losartan  (COZAAR ) 25 MG tablet Take 1 tablet (25 mg total)  by mouth daily.   naproxen  (NAPROSYN ) 500 MG tablet Take 1 tablet (500 mg total) by mouth 2 (two) times daily with a meal.   pantoprazole  (PROTONIX ) 40 MG tablet Take 1 tablet (40 mg total) by mouth daily.   SYRINGE-NEEDLE, DISP, 3 ML (B-D 3CC LUER-LOK SYR 25GX5/8) 25G X 5/8 3 ML MISC use as directed with B12 injections   No facility-administered encounter medications on file as of 01/21/2024.     There were no vitals filed for this visit. There is no height or weight on file to calculate BMI.   ECOG PERFORMANCE STATUS: {CHL ONC ECOG [phone]}  PHYSICAL EXAM GENERAL:alert, no distress and comfortable SKIN: no rash  EYES: sclera clear NECK: without mass LYMPH:  no palpable cervical or supraclavicular lymphadenopathy  LUNGS: clear with normal breathing effort HEART: regular rate & rhythm, no lower extremity edema ABDOMEN: abdomen soft, non-tender and normal bowel sounds NEURO: alert & oriented x 3 with fluent speech, no focal motor/sensory deficits Breast exam:  PAC without erythema    CBC    Latest Ref Rng & Units 01/31/2023   12:17 PM 11/28/2022   10:10 AM 11/01/2022   11:39 AM  CBC  WBC 4.0 - 10.5 K/uL 5.8  5.1  5.8   Hemoglobin 12.0 - 15.0 g/dL 86.5  86.0  85.4   Hematocrit 36.0 - 46.0 % 40.1  41.7  42.6   Platelets 150 - 400 K/uL 264  266.0  288       CMP     Latest Ref Rng & Units 11/28/2022   10:10 AM 10/19/2020    9:20 AM 08/17/2020   11:22 AM  CMP  Glucose 70 - 99 mg/dL 81  889  95   BUN 6 - 23 mg/dL 20  22  15    Creatinine 0.40 - 1.20 mg/dL 9.32  9.41  9.45   Sodium 135 - 145 mEq/L 139  139  140   Potassium 3.5 - 5.1 mEq/L 4.7  4.2  3.8   Chloride 96 - 112 mEq/L 103  103  105   CO2 19 - 32 mEq/L 30  22  26    Calcium  8.4 - 10.5 mg/dL 9.7  9.2  9.3   Total Protein 6.0 - 8.3 g/dL 6.9     Total Bilirubin 0.2 - 1.2 mg/dL 0.5     Alkaline Phos 39 - 117 U/L 73     AST 0 - 37 U/L 13     ALT 0 - 35 U/L 15         ASSESSMENT & PLAN:  PLAN:  No orders  of the defined types were placed in this encounter.     All questions were answered. The patient knows to call the clinic with any problems, questions or concerns. No barriers to learning were detected. I spent *** counseling the patient face to face. The total time spent in the appointment was *** and more than 50% was on counseling, review of test results, and coordination of care.   Shalin Linders K Lavon Bothwell, NP 01/20/2024 7:38 PM

## 2024-01-21 ENCOUNTER — Encounter: Payer: Self-pay | Admitting: Hematology

## 2024-01-21 ENCOUNTER — Encounter: Payer: Self-pay | Admitting: Nurse Practitioner

## 2024-01-21 ENCOUNTER — Inpatient Hospital Stay (HOSPITAL_BASED_OUTPATIENT_CLINIC_OR_DEPARTMENT_OTHER): Admitting: Nurse Practitioner

## 2024-01-21 ENCOUNTER — Ambulatory Visit: Payer: Self-pay | Admitting: Nurse Practitioner

## 2024-01-21 ENCOUNTER — Inpatient Hospital Stay: Attending: Hematology

## 2024-01-21 VITALS — BP 132/76 | HR 80 | Temp 98.3°F | Resp 16 | Wt 202.2 lb

## 2024-01-21 DIAGNOSIS — D500 Iron deficiency anemia secondary to blood loss (chronic): Secondary | ICD-10-CM

## 2024-01-21 DIAGNOSIS — Z7901 Long term (current) use of anticoagulants: Secondary | ICD-10-CM | POA: Diagnosis not present

## 2024-01-21 DIAGNOSIS — Z86711 Personal history of pulmonary embolism: Secondary | ICD-10-CM | POA: Diagnosis not present

## 2024-01-21 DIAGNOSIS — E538 Deficiency of other specified B group vitamins: Secondary | ICD-10-CM

## 2024-01-21 LAB — CBC WITH DIFFERENTIAL (CANCER CENTER ONLY)
Abs Immature Granulocytes: 0.01 K/uL (ref 0.00–0.07)
Basophils Absolute: 0 K/uL (ref 0.0–0.1)
Basophils Relative: 1 %
Eosinophils Absolute: 0.1 K/uL (ref 0.0–0.5)
Eosinophils Relative: 2 %
HCT: 40.3 % (ref 36.0–46.0)
Hemoglobin: 13.7 g/dL (ref 12.0–15.0)
Immature Granulocytes: 0 %
Lymphocytes Relative: 25 %
Lymphs Abs: 1.4 K/uL (ref 0.7–4.0)
MCH: 30.4 pg (ref 26.0–34.0)
MCHC: 34 g/dL (ref 30.0–36.0)
MCV: 89.6 fL (ref 80.0–100.0)
Monocytes Absolute: 0.4 K/uL (ref 0.1–1.0)
Monocytes Relative: 7 %
Neutro Abs: 3.8 K/uL (ref 1.7–7.7)
Neutrophils Relative %: 65 %
Platelet Count: 262 K/uL (ref 150–400)
RBC: 4.5 MIL/uL (ref 3.87–5.11)
RDW: 12.9 % (ref 11.5–15.5)
WBC Count: 5.8 K/uL (ref 4.0–10.5)
nRBC: 0 % (ref 0.0–0.2)

## 2024-01-21 LAB — IRON AND IRON BINDING CAPACITY (CC-WL,HP ONLY)
Iron: 64 ug/dL (ref 28–170)
Saturation Ratios: 24 % (ref 10.4–31.8)
TIBC: 266 ug/dL (ref 250–450)
UIBC: 202 ug/dL (ref 148–442)

## 2024-01-21 LAB — VITAMIN B12: Vitamin B-12: 538 pg/mL (ref 180–914)

## 2024-01-21 LAB — FERRITIN: Ferritin: 83 ng/mL (ref 11–307)

## 2024-02-12 ENCOUNTER — Ambulatory Visit: Admitting: Family Medicine

## 2024-02-12 ENCOUNTER — Encounter: Payer: Self-pay | Admitting: Family Medicine

## 2024-02-12 VITALS — BP 136/82 | HR 87 | Temp 97.3°F | Ht 63.0 in | Wt 196.0 lb

## 2024-02-12 DIAGNOSIS — Z86711 Personal history of pulmonary embolism: Secondary | ICD-10-CM | POA: Diagnosis not present

## 2024-02-12 DIAGNOSIS — Z85820 Personal history of malignant melanoma of skin: Secondary | ICD-10-CM | POA: Insufficient documentation

## 2024-02-12 DIAGNOSIS — Z0000 Encounter for general adult medical examination without abnormal findings: Secondary | ICD-10-CM | POA: Diagnosis not present

## 2024-02-12 DIAGNOSIS — E66811 Obesity, class 1: Secondary | ICD-10-CM

## 2024-02-12 DIAGNOSIS — Z1231 Encounter for screening mammogram for malignant neoplasm of breast: Secondary | ICD-10-CM

## 2024-02-12 DIAGNOSIS — G4733 Obstructive sleep apnea (adult) (pediatric): Secondary | ICD-10-CM

## 2024-02-12 DIAGNOSIS — Z6834 Body mass index (BMI) 34.0-34.9, adult: Secondary | ICD-10-CM | POA: Diagnosis not present

## 2024-02-12 DIAGNOSIS — E782 Mixed hyperlipidemia: Secondary | ICD-10-CM | POA: Diagnosis not present

## 2024-02-12 DIAGNOSIS — Z8589 Personal history of malignant neoplasm of other organs and systems: Secondary | ICD-10-CM | POA: Insufficient documentation

## 2024-02-12 DIAGNOSIS — I10 Essential (primary) hypertension: Secondary | ICD-10-CM | POA: Diagnosis not present

## 2024-02-12 DIAGNOSIS — C4492 Squamous cell carcinoma of skin, unspecified: Secondary | ICD-10-CM | POA: Diagnosis not present

## 2024-02-12 DIAGNOSIS — Z13220 Encounter for screening for lipoid disorders: Secondary | ICD-10-CM

## 2024-02-12 DIAGNOSIS — E538 Deficiency of other specified B group vitamins: Secondary | ICD-10-CM

## 2024-02-12 DIAGNOSIS — E6609 Other obesity due to excess calories: Secondary | ICD-10-CM | POA: Diagnosis not present

## 2024-02-12 DIAGNOSIS — E039 Hypothyroidism, unspecified: Secondary | ICD-10-CM

## 2024-02-12 MED ORDER — ZEPBOUND 2.5 MG/0.5ML ~~LOC~~ SOLN: 2.5000 mg | SUBCUTANEOUS | 0 refills | Status: DC

## 2024-02-12 NOTE — Assessment & Plan Note (Addendum)
 BP is adequately controlled. Continue losartan  25 mg daily.

## 2024-02-12 NOTE — Assessment & Plan Note (Addendum)
 Discussed principles of weight management, including a foundation of a healthy, calorie-controlled diet and regular exercise. I recommend the patient achieve 150 minutes of moderate-intensity exercise weekly. We did discuss the role of medication to augment dietary and exercise efforts. I will start her on tirzepatide 2.5 mg weekly. Plan to increase dose to 5 mg in 1 month. I would like to see her back in 2 months.

## 2024-02-12 NOTE — Assessment & Plan Note (Signed)
 Recommend ongoing follow-up with dermatology, esp. regarding ongoing use of 5-FU cream.

## 2024-02-12 NOTE — Assessment & Plan Note (Signed)
 Overall health is good. Recommend regular exercise. Discussed recommended screenings and immunizations.

## 2024-02-12 NOTE — Assessment & Plan Note (Signed)
Continue apixaban 5 mg bid indefinitely.

## 2024-02-12 NOTE — Assessment & Plan Note (Signed)
 I recommend we have MS. Granito meet with [facility] to review her sleep apnea treatment. Hopefully, they will have some approaches that could help her keep her CPAP in place or discuss other alternatives. We discussed weight loss as an approach to help improve this.

## 2024-02-12 NOTE — Assessment & Plan Note (Addendum)
 I will check lipids today. We will determine if atorvastatin  should be restarted.

## 2024-02-12 NOTE — Assessment & Plan Note (Addendum)
 I will check her TSH. Continue levothyroxine  25 mcg daily.

## 2024-02-12 NOTE — Assessment & Plan Note (Signed)
 Recent Vitamin B12 is normal. Continue monthly B12 injections.

## 2024-02-12 NOTE — Progress Notes (Addendum)
 [facility] [address] [place] [address] Dept: [phone] Dept Fax: [phone]  Annual Physical Visit  Subjective:    Patient ID: Rhonda Ford, female    DOB: Oct 19, 1970, 53 y.o..   MRN: [id number]  Chief Complaint  Patient presents with   Annual Exam    CPE/labs.     History of Present Illness:  Patient is in today for an annual physical/preventative visit.  Rhonda Ford has a history of hypertension. She is managed on losartan  25 mg daily.   Rhonda Ford has sleep apnea. She has a CPAP unit, but notes she often awakens and finds this off of her face. She does still have some fatigue. She has not ever followed up with a sleep specialist regarding her use of the CPAP.   Rhonda Ford has a history of hypothyroidism. She is managed on levothyroxine  25 mcg daily.   Rhonda Ford has a past history of recurrent, severe iron -deficiency anemia related to having heavy periods. Now that she is post-menopausal, she is no longer having this issue. Her iron  studies this year were normal. She has had past Vitamin B12 deficiency, so takes B 12 injections. Her recent levels were also normal.   Rhonda Ford has a history of a prior spontaneous pulmonary embolus. She has been recommended to stay on Eliquis  5 mg bid indefinitely.   Rhonda Ford has a history of hyperlipidemia. She had been on atorvastatin  int he past. She has been off of this for over a year. She has made significant dietary changes, which have seemed to help.  Rhonda Ford has a history of Class 1 obesity. She feels frustrated with her inability to loose weight. She does intermittent fasting, has reduced carbs in her diet, and is doing some regular exercise.  Review of Systems  Constitutional:  Negative for chills, diaphoresis, fever, malaise/fatigue and weight loss.  HENT:  Negative for congestion, ear pain, hearing loss, sinus pain, sore  throat and tinnitus.   Eyes:  Negative for blurred vision, pain, discharge and redness.  Respiratory:  Negative for cough, shortness of breath and wheezing.   Cardiovascular:  Negative for chest pain and palpitations.  Gastrointestinal:  Positive for heartburn. Negative for abdominal pain, constipation, diarrhea, nausea and vomiting.       History of prior PUD. She finds when she tries to stop her PPI, she gets a flare of acid symptoms, so resumes.  Musculoskeletal:  Negative for back pain, joint pain and myalgias.  Skin:  Positive for rash. Negative for itching.       Has had multiple skin issues, including prior squamous cell carcinomas. She uses 5-FU cream and plans to follow up with her dermatologist.  Psychiatric/Behavioral:  Negative for depression. The patient is not nervous/anxious.    Past Medical History: Patient Active Problem List   Diagnosis Date Noted   Right foot pain 12/13/2022   Menorrhagia 05/19/2021   History of cervical dysplasia 02/10/2021   Squamous cell carcinoma of multiple sites 02/10/2021   History of pulmonary embolus (PE) 02/10/2021   Hyperlipidemia 02/10/2021   Obstructive sleep apnea 02/10/2021   Class 1 obesity due to excess calories with body mass index (BMI) of 34.0 to 34.9 in adult 02/10/2021   Vitamin B12 deficiency 02/10/2021   Essential hypertension 02/10/2021   History of stomach ulcers 02/10/2021   Hearing impaired 02/09/2021   Hypothyroidism 11/24/2018   Iron  deficiency anemia due to chronic blood loss 06/19/2018   Melanoma (HCC)  04/09/2016   Past Surgical History:  Procedure Laterality Date   CESAREAN SECTION     x 4   melanoma surgery      leg   TUBAL LIGATION     Family History  Problem Relation Age of Onset   Hypertension Mother    Heart disease Mother    Stroke Mother    Cancer Father        Skin   Stroke Maternal Aunt    Cancer Maternal Aunt 20       breast cancer and NHL   Cancer Paternal Uncle        Melanoma   Stroke  Maternal Grandmother    Heart disease Maternal Grandmother    Heart attack Maternal Grandmother    Cancer Maternal Grandfather        bone cancer   Cancer Paternal Grandmother        colon, melanoma   Colon cancer Paternal Grandmother    Thrombosis Neg Hx    Esophageal cancer Neg Hx    Pancreatic cancer Neg Hx    Stomach cancer Neg Hx    Diabetes Neg Hx    Outpatient Medications Prior to Visit  Medication Sig Dispense Refill   apixaban  (ELIQUIS ) 5 MG TABS tablet Take 1 tablet (5 mg total) by mouth 2 (two) times daily. 60 tablet 4   atorvastatin  (LIPITOR) 20 MG tablet Take 1 tablet (20 mg total) by mouth daily. SCHEDULE OFFICE VISIT FOR FUTURE REFILLS. 7 tablet 0   cyanocobalamin  (VITAMIN B12) 1000 MCG/ML injection Inject 1 ml into the muscle once for 1 dose every month 1 mL 11   Ferrous Sulfate (SLOW FE PO) Take 1 tablet by mouth daily.     fluorouracil  (EFUDEX ) 5 % cream Apply topically to the affected areas 2 times a day for 2 weeks 40 g 3   levothyroxine  (SYNTHROID ) 25 MCG tablet Take 1 tablet (25 mcg total) by mouth daily before breakfast. 90 tablet 3   losartan  (COZAAR ) 25 MG tablet Take 1 tablet (25 mg total) by mouth daily. 90 tablet 3   naproxen  (NAPROSYN ) 500 MG tablet Take 1 tablet (500 mg total) by mouth 2 (two) times daily with a meal. 60 tablet 0   pantoprazole  (PROTONIX ) 40 MG tablet Take 1 tablet (40 mg total) by mouth daily. 90 tablet 3   SYRINGE-NEEDLE, DISP, 3 ML (B-D 3CC LUER-LOK SYR 25GX5/8) 25G X 5/8 3 ML MISC use as directed with B12 injections 4 each 3   No facility-administered medications prior to visit.   No Known Allergies Objective:   Today's Vitals   02/12/24 1458  BP: 136/82  Pulse: 87  Temp: (!) 97.3 F (36.3 C)  TempSrc: Temporal  SpO2: 99%  Weight: 196 lb (88.9 kg)  Height: 5' 3 (1.6 m)   Body mass index is 34.72 kg/m.   General: Well developed, well nourished. No acute distress. HEENT: Normocephalic, non-traumatic. PERRL, EOMI.  Conjunctiva clear. External ears normal. EAC and TMs normal bilaterally.   Nose clear without congestion or rhinorrhea. Mucous membranes moist. Oropharynx clear. Good dentition. Neck: Supple. No lymphadenopathy. No thyromegaly. Lungs: Clear to auscultation bilaterally. No wheezing, rales or rhonchi. CV: RRR without murmurs or rubs. Pulses 2+ bilaterally. Abdomen: Soft, non-tender. Bowel sounds positive, normal pitch and frequency. No hepatosplenomegaly. No rebound or guarding. Extremities: Full ROM. No joint swelling or tenderness. No edema noted. Skin: Warm and dry. No rashes. Psych: Alert and oriented. Normal mood and affect.  Health Maintenance  Due  Topic Date Due   Zoster Vaccines- Shingrix (1 of 2) Never done   Pneumococcal Vaccine: 50+ Years (1 of 1 - PCV) Never done   COVID-19 Vaccine (3 - Pfizer risk series) 06/05/2019   Hepatitis B Vaccines 19-59 Average Risk (3 of 3 - 19+ 3-dose series) 06/06/2021   Mammogram  07/20/2023   Influenza Vaccine  11/08/2023     Assessment & Plan:   Problem List Items Addressed This Visit       Cardiovascular and Mediastinum   Essential hypertension   BP is adequately controlled. Continue losartan  25 mg daily.       Relevant Orders   Basic metabolic panel with GFR     Respiratory   Obstructive sleep apnea   I recommend we have Rhonda Ford meet with [facility] to review her sleep apnea treatment. Hopefully, they will have some approaches that could help her keep her CPAP in place or discuss other alternatives. We discussed weight loss as an approach to help improve this.      Relevant Orders   Ambulatory referral to Neurology     Endocrine   Hypothyroidism   I will check her TSH. Continue levothyroxine  25 mcg daily.        Relevant Orders   TSH     Other   Annual physical exam - Primary   Overall health is good. Recommend regular exercise. Discussed recommended screenings and immunizations.       Class 1 obesity due to excess  calories with body mass index (BMI) of 34.0 to 34.9 in adult   Discussed principles of weight management, including a foundation of a healthy, calorie-controlled diet and regular exercise. I recommend the patient achieve 150 minutes of moderate-intensity exercise weekly. We did discuss the role of medication to augment dietary and exercise efforts. I will start her on tirzepatide 2.5 mg weekly. Plan to increase dose to 5 mg in 1 month. I would like to see her back in 2 months.       Relevant Medications   tirzepatide (ZEPBOUND) 2.5 MG/0.5ML injection vial   Other Relevant Orders   Hemoglobin A1c   Basic metabolic panel with GFR   History of pulmonary embolus (PE)   Continue apixaban  5 mg bid indefinitely.      Hyperlipidemia   I will check lipids today. We will determine if atorvastatin  should be restarted.      Relevant Orders   Lipid panel   Squamous cell carcinoma of multiple sites   Recommend ongoing follow-up with dermatology, esp. regarding ongoing use of 5-FU cream.      Vitamin B12 deficiency   Recent Vitamin B12 is normal. Continue monthly B12 injections.      Other Visit Diagnoses       Screening for lipid disorders         Encounter for screening mammogram for malignant neoplasm of breast       Relevant Orders   MM DIGITAL SCREENING BILATERAL       Return in about 2 months (around 04/13/2024) for Reassessment.   Garnette CHRISTELLA Simpler, MD  I,Emily Lagle,acting as a scribe for Garnette CHRISTELLA Simpler, MD.,have documented all relevant documentation on the behalf of Garnette CHRISTELLA Simpler, MD.  I, Garnette CHRISTELLA Simpler, MD, have reviewed all documentation for this visit. The documentation on 02/12/2024 for the exam, diagnosis, procedures, and orders are all accurate and complete.

## 2024-02-13 ENCOUNTER — Ambulatory Visit: Payer: Self-pay | Admitting: Family Medicine

## 2024-02-13 LAB — BASIC METABOLIC PANEL WITH GFR
BUN: 15 mg/dL (ref 6–23)
CO2: 25 meq/L (ref 19–32)
Calcium: 9.3 mg/dL (ref 8.4–10.5)
Chloride: 106 meq/L (ref 96–112)
Creatinine, Ser: 0.67 mg/dL (ref 0.40–1.20)
GFR: 98.36 mL/min (ref >60.00)
Glucose, Bld: 83 mg/dL (ref 70–99)
Potassium: 3.8 meq/L (ref 3.5–5.1)
Sodium: 140 meq/L (ref 135–145)

## 2024-02-13 LAB — LIPID PANEL
Cholesterol: 190 mg/dL (ref 0–200)
HDL: 57.2 mg/dL (ref >39.00)
LDL Cholesterol: 117 mg/dL — ABNORMAL HIGH (ref 0–99)
NonHDL: 133.1
Total CHOL/HDL Ratio: 3
Triglycerides: 82 mg/dL (ref 0.0–149.0)
VLDL: 16.4 mg/dL (ref 0.0–40.0)

## 2024-02-13 LAB — TSH: TSH: 2.37 u[IU]/mL (ref 0.35–5.50)

## 2024-02-13 LAB — HEMOGLOBIN A1C: Hgb A1c MFr Bld: 5 % (ref 4.6–6.5)

## 2024-02-25 ENCOUNTER — Inpatient Hospital Stay (HOSPITAL_BASED_OUTPATIENT_CLINIC_OR_DEPARTMENT_OTHER): Admission: RE | Admit: 2024-02-25 | Source: Ambulatory Visit | Admitting: Radiology

## 2024-03-04 ENCOUNTER — Encounter (HOSPITAL_BASED_OUTPATIENT_CLINIC_OR_DEPARTMENT_OTHER): Payer: Self-pay | Admitting: Radiology

## 2024-03-04 ENCOUNTER — Inpatient Hospital Stay (HOSPITAL_BASED_OUTPATIENT_CLINIC_OR_DEPARTMENT_OTHER): Admission: RE | Admit: 2024-03-04 | Discharge: 2024-03-04 | Attending: Family Medicine | Admitting: Radiology

## 2024-03-04 DIAGNOSIS — Z1231 Encounter for screening mammogram for malignant neoplasm of breast: Secondary | ICD-10-CM

## 2024-03-08 ENCOUNTER — Encounter: Payer: Self-pay | Admitting: Family Medicine

## 2024-03-08 DIAGNOSIS — E66811 Obesity, class 1: Secondary | ICD-10-CM

## 2024-03-09 ENCOUNTER — Encounter: Payer: Self-pay | Admitting: Family Medicine

## 2024-03-09 ENCOUNTER — Other Ambulatory Visit: Payer: Self-pay | Admitting: Family Medicine

## 2024-03-09 DIAGNOSIS — G4733 Obstructive sleep apnea (adult) (pediatric): Secondary | ICD-10-CM

## 2024-03-09 MED ORDER — TIRZEPATIDE-WEIGHT MANAGEMENT 5 MG/0.5ML ~~LOC~~ SOLN: 5.0000 mg | SUBCUTANEOUS | 2 refills | Status: DC

## 2024-03-09 NOTE — Telephone Encounter (Signed)
 Please review message about dose increase on Zepbound .

## 2024-03-17 ENCOUNTER — Other Ambulatory Visit (HOSPITAL_COMMUNITY): Payer: Self-pay

## 2024-03-17 ENCOUNTER — Other Ambulatory Visit: Payer: Self-pay | Admitting: Hematology

## 2024-03-17 ENCOUNTER — Other Ambulatory Visit: Payer: Self-pay

## 2024-03-17 DIAGNOSIS — D649 Anemia, unspecified: Secondary | ICD-10-CM

## 2024-03-17 DIAGNOSIS — E538 Deficiency of other specified B group vitamins: Secondary | ICD-10-CM

## 2024-03-17 MED ORDER — CYANOCOBALAMIN 1000 MCG/ML IJ SOLN
INTRAMUSCULAR | 11 refills | Status: AC
  Filled 2024-03-17: qty 1, 30d supply, fill #0

## 2024-04-13 ENCOUNTER — Ambulatory Visit: Admitting: Family Medicine

## 2024-04-13 ENCOUNTER — Encounter: Payer: Self-pay | Admitting: Family Medicine

## 2024-04-13 VITALS — BP 132/80 | HR 92 | Temp 98.1°F | Ht 63.0 in | Wt 183.0 lb

## 2024-04-13 DIAGNOSIS — E66811 Obesity, class 1: Secondary | ICD-10-CM

## 2024-04-13 DIAGNOSIS — I10 Essential (primary) hypertension: Secondary | ICD-10-CM

## 2024-04-13 DIAGNOSIS — Z6834 Body mass index (BMI) 34.0-34.9, adult: Secondary | ICD-10-CM

## 2024-04-13 DIAGNOSIS — E6609 Other obesity due to excess calories: Secondary | ICD-10-CM

## 2024-04-13 NOTE — Assessment & Plan Note (Signed)
Continue apixaban 5 mg bid indefinitely.

## 2024-04-13 NOTE — Assessment & Plan Note (Signed)
 Lipids in adequate control with dietary changes. Hold off on atorvastatin  for now and monitor lipids.

## 2024-04-13 NOTE — Assessment & Plan Note (Addendum)
 Maximum weight: 203 lbs (06/2021) Current weight: 183 lbs Weight change since last visit: - 13 lbs Total weight loss: - 20 lbs (9.8 %)  Good early response. Encourage ongoing efforts at diet and exercise. Continue tirzepatide  5 mg weekly.

## 2024-04-13 NOTE — Assessment & Plan Note (Signed)
 Recent Vitamin B12 is normal. Continue monthly B12 injections.

## 2024-04-13 NOTE — Assessment & Plan Note (Addendum)
 BP is adequately controlled. Continue losartan  25 mg daily. Continue focus on weight loss.

## 2024-04-13 NOTE — Assessment & Plan Note (Signed)
 Recommend ongoing follow-up with dermatology, esp. regarding ongoing use of 5-FU cream.

## 2024-04-13 NOTE — Assessment & Plan Note (Signed)
Continue levothyroxine 25mcg daily.

## 2024-04-13 NOTE — Progress Notes (Signed)
 " [facility] [address] [place] [address] Dept: [phone] Dept Fax: [phone]  Chronic Care Office Visit  Subjective:    Patient ID: Rhonda Ford, female    DOB: Oct 28, 1970, 54 y.o..   MRN: [id number]  Chief Complaint  Patient presents with   Hypertension    2 month fu HTN/weight.  Down 13 lbs.  Wants to get increase in meds.    History of Present Illness:  Patient is in today for reassessment of chronic medical conditions.   Rhonda Ford has a history of hypertension. She is managed on losartan  25 mg daily.   Rhonda Ford has a history of Class 1 obesity. She does intermittent fasting, has reduced carbs in her diet, and is doing some regular exercise. At her last visit, 02/12/2024, I started her on tirzepatide  2.5 mg weekly which is now titrated up to 5 mg weekly. She is taking a stool softener. She denies any significant side effects and is pleased with her early weight loss.   Past Medical History: Patient Active Problem List   Diagnosis Date Noted   History of melanoma 02/12/2024   History of squamous cell carcinoma 02/12/2024   Annual physical exam 02/12/2024   Right foot pain 12/13/2022   Menorrhagia 05/19/2021   History of cervical dysplasia 02/10/2021   Squamous cell carcinoma of multiple sites 02/10/2021   History of pulmonary embolus (PE) 02/10/2021   Hyperlipidemia 02/10/2021   Obstructive sleep apnea 02/10/2021   Class 1 obesity due to excess calories with body mass index (BMI) of 34.0 to 34.9 in adult 02/10/2021   Vitamin B12 deficiency 02/10/2021   Essential hypertension 02/10/2021   History of stomach ulcers 02/10/2021   Hearing impaired 02/09/2021   Hypothyroidism 11/24/2018   Iron  deficiency anemia due to chronic blood loss 06/19/2018   Past Surgical History:  Procedure Laterality Date   CESAREAN SECTION     x 4   melanoma surgery      leg   TUBAL LIGATION     Family  History  Problem Relation Age of Onset   Hypertension Mother    Heart disease Mother    Stroke Mother    Cancer Father        Skin   Stroke Maternal Aunt    Cancer Maternal Aunt 68       breast cancer and NHL   Cancer Paternal Uncle        Melanoma   Stroke Maternal Grandmother    Heart disease Maternal Grandmother    Heart attack Maternal Grandmother    Cancer Maternal Grandfather        bone cancer   Cancer Paternal Grandmother        colon, melanoma   Colon cancer Paternal Grandmother    Thrombosis Neg Hx    Esophageal cancer Neg Hx    Pancreatic cancer Neg Hx    Stomach cancer Neg Hx    Diabetes Neg Hx    Outpatient Medications Prior to Visit  Medication Sig Dispense Refill   apixaban  (ELIQUIS ) 5 MG TABS tablet Take 1 tablet (5 mg total) by mouth 2 (two) times daily. 60 tablet 4   atorvastatin  (LIPITOR) 20 MG tablet Take 1 tablet (20 mg total) by mouth daily. SCHEDULE OFFICE VISIT FOR FUTURE REFILLS. (Patient not taking: Reported on 02/12/2024) 7 tablet 0   cyanocobalamin  (VITAMIN B12) 1000 MCG/ML injection Inject 1 ml into the muscle once for 1 dose every month 1  mL 11   fluorouracil  (EFUDEX ) 5 % cream Apply topically to the affected areas 2 times a day for 2 weeks 40 g 3   levothyroxine  (SYNTHROID ) 25 MCG tablet Take 1 tablet (25 mcg total) by mouth daily before breakfast. 90 tablet 3   losartan  (COZAAR ) 25 MG tablet Take 1 tablet (25 mg total) by mouth daily. 90 tablet 3   pantoprazole  (PROTONIX ) 40 MG tablet Take 1 tablet (40 mg total) by mouth daily. 90 tablet 3   tirzepatide  (ZEPBOUND ) 2.5 MG/0.5ML injection vial Inject 2.5 mg into the skin once a week. 2 mL 0   tirzepatide  5 MG/0.5ML injection vial Inject 5 mg into the skin once a week. 2 mL 2   No facility-administered medications prior to visit.   Allergies[1]   Objective:   Today's Vitals   04/13/24 1352  BP: 132/80  Pulse: 92  Temp: 98.1 F (36.7 C)  TempSrc: Temporal  SpO2: 98%  Weight: 183 lb (83 kg)   Height: 5' 3 (1.6 m)   Body mass index is 32.42 kg/m.   General: Well developed, well nourished. No acute distress. Psych: Alert and oriented. Normal mood and affect.  Health Maintenance Due  Topic Date Due   Zoster Vaccines- Shingrix (1 of 2) Never done   Pneumococcal Vaccine: 50+ Years (1 of 1 - PCV) Never done   COVID-19 Vaccine (3 - Pfizer risk series) 06/05/2019   Imaging MM 3D SCREENING MAMMOGRAM BILATERAL BREAST Result Date: 03/12/2024 IMPRESSION: No mammographic evidence of malignancy. BI-RADS CATEGORY  1: Negative.   Lab Results Lab Results  Component Value Date   TSH 2.37 02/12/2024   Lipid Panel:  Lab Results  Component Value Date   CHOL 190 02/12/2024   HDL 57.20 02/12/2024   LDLCALC 117 (H) 02/12/2024   TRIG 82.0 02/12/2024     Lab Results  Component Value Date   VITAMINB12 538 01/21/2024   Assessment & Plan:   Problem List Items Addressed This Visit       Cardiovascular and Mediastinum   Essential hypertension   BP is adequately controlled. Continue losartan  25 mg daily. Continue focus on weight loss.        Other   Class 1 obesity due to excess calories with body mass index (BMI) of 34.0 to 34.9 in adult - Primary   Maximum weight: 203 lbs (06/2021) Current weight: 183 lbs Weight change since last visit: - 13 lbs Total weight loss: - 20 lbs (9.8 %)  Good early response. Encourage ongoing efforts at diet and exercise. Continue tirzepatide  5 mg weekly.        Return in about 2 months (around 06/11/2024) for Reassessment.   Garnette CHRISTELLA Simpler, MD  I,Emily Lagle,acting as a scribe for Garnette CHRISTELLA Simpler, MD.,have documented all relevant documentation on the behalf of Garnette CHRISTELLA Simpler, MD.  I, Garnette CHRISTELLA Simpler, MD, have reviewed all documentation for this visit. The documentation on 04/13/2024 for the exam, diagnosis, procedures, and orders are all accurate and complete.     [1] No Known Allergies  "

## 2024-05-14 ENCOUNTER — Encounter: Payer: Self-pay | Admitting: Family Medicine

## 2024-05-14 DIAGNOSIS — E66811 Obesity, class 1: Secondary | ICD-10-CM

## 2024-05-15 MED ORDER — TIRZEPATIDE-WEIGHT MANAGEMENT 7.5 MG/0.5ML ~~LOC~~ SOLN: 7.5000 mg | SUBCUTANEOUS | 1 refills | Status: AC

## 2025-01-26 ENCOUNTER — Inpatient Hospital Stay

## 2025-01-26 ENCOUNTER — Inpatient Hospital Stay: Admitting: Nurse Practitioner
